# Patient Record
Sex: Male | Born: 1945
Health system: Southern US, Community
[De-identification: ages and names within clinical notes are randomized; demographics above are authoritative.]

## PROBLEM LIST (undated history)

## (undated) DIAGNOSIS — I422 Other hypertrophic cardiomyopathy: Secondary | ICD-10-CM

## (undated) DIAGNOSIS — K250 Acute gastric ulcer with hemorrhage: Secondary | ICD-10-CM

## (undated) DIAGNOSIS — I10 Essential (primary) hypertension: Secondary | ICD-10-CM

## (undated) DIAGNOSIS — I2510 Atherosclerotic heart disease of native coronary artery without angina pectoris: Secondary | ICD-10-CM

## (undated) DIAGNOSIS — E785 Hyperlipidemia, unspecified: Secondary | ICD-10-CM

## (undated) HISTORY — DX: Other hypertrophic cardiomyopathy: I42.2

## (undated) HISTORY — DX: Acute gastric ulcer with hemorrhage: K25.0

## (undated) HISTORY — DX: Hyperlipidemia, unspecified: E78.5

---

## 1990-07-23 HISTORY — PX: BACK SURGERY: SHX140

## 2002-09-01 ENCOUNTER — Ambulatory Visit (HOSPITAL_BASED_OUTPATIENT_CLINIC_OR_DEPARTMENT_OTHER): Admission: RE | Admit: 2002-09-01 | Discharge: 2002-09-01 | Payer: Self-pay | Admitting: Neurology

## 2003-06-16 ENCOUNTER — Ambulatory Visit (HOSPITAL_COMMUNITY): Admission: RE | Admit: 2003-06-16 | Discharge: 2003-06-16 | Payer: Self-pay | Admitting: Gastroenterology

## 2004-05-08 ENCOUNTER — Emergency Department (HOSPITAL_COMMUNITY): Admission: EM | Admit: 2004-05-08 | Discharge: 2004-05-08 | Payer: Self-pay | Admitting: Emergency Medicine

## 2006-09-06 ENCOUNTER — Ambulatory Visit: Payer: Self-pay | Admitting: Family Medicine

## 2006-09-06 DIAGNOSIS — K5900 Constipation, unspecified: Secondary | ICD-10-CM | POA: Insufficient documentation

## 2006-09-06 DIAGNOSIS — I10 Essential (primary) hypertension: Secondary | ICD-10-CM

## 2006-09-06 DIAGNOSIS — E785 Hyperlipidemia, unspecified: Secondary | ICD-10-CM

## 2006-09-06 LAB — CONVERTED CEMR LAB: HDL goal, serum: 40 mg/dL

## 2006-09-09 ENCOUNTER — Encounter (INDEPENDENT_AMBULATORY_CARE_PROVIDER_SITE_OTHER): Payer: Self-pay | Admitting: Family Medicine

## 2006-09-11 ENCOUNTER — Ambulatory Visit: Payer: Self-pay | Admitting: Family Medicine

## 2006-09-12 LAB — CONVERTED CEMR LAB
Albumin: 4.5 g/dL (ref 3.5–5.2)
CO2: 28 meq/L (ref 19–32)
Chloride: 103 meq/L (ref 96–112)
Cholesterol: 222 mg/dL — ABNORMAL HIGH (ref 0–200)
Glucose, Bld: 155 mg/dL — ABNORMAL HIGH (ref 70–99)
HCT: 42.5 % (ref 39.0–52.0)
Lymphocytes Relative: 36 % (ref 12–46)
Lymphs Abs: 1.6 10*3/uL (ref 0.7–3.3)
Microalb Creat Ratio: 3 mg/g (ref 0.0–30.0)
Microalb, Ur: 0.48 mg/dL (ref 0.00–1.89)
Neutrophils Relative %: 52 % (ref 43–77)
Platelets: 254 10*3/uL (ref 150–400)
Potassium: 4.9 meq/L (ref 3.5–5.3)
Sodium: 139 meq/L (ref 135–145)
Total Protein: 6.7 g/dL (ref 6.0–8.3)
Triglycerides: 70 mg/dL (ref <150)
WBC: 4.5 10*3/uL (ref 4.0–10.5)

## 2006-10-04 ENCOUNTER — Ambulatory Visit: Payer: Self-pay | Admitting: Family Medicine

## 2006-10-04 LAB — CONVERTED CEMR LAB
Hgb A1c MFr Bld: 6.3 %
LDL Goal: 70 mg/dL

## 2006-11-15 ENCOUNTER — Ambulatory Visit: Payer: Self-pay | Admitting: Family Medicine

## 2006-11-18 ENCOUNTER — Ambulatory Visit: Payer: Self-pay | Admitting: Family Medicine

## 2006-11-18 LAB — CONVERTED CEMR LAB
ALT: 17 units/L (ref 0–53)
AST: 22 units/L (ref 0–37)
Alkaline Phosphatase: 59 units/L (ref 39–117)
Calcium: 9.7 mg/dL (ref 8.4–10.5)
Chloride: 105 meq/L (ref 96–112)
Creatinine, Ser: 0.87 mg/dL (ref 0.40–1.50)
Potassium: 4.7 meq/L (ref 3.5–5.3)

## 2006-11-29 ENCOUNTER — Telehealth (INDEPENDENT_AMBULATORY_CARE_PROVIDER_SITE_OTHER): Payer: Self-pay | Admitting: Family Medicine

## 2006-12-27 ENCOUNTER — Ambulatory Visit: Payer: Self-pay | Admitting: Family Medicine

## 2006-12-27 DIAGNOSIS — F528 Other sexual dysfunction not due to a substance or known physiological condition: Secondary | ICD-10-CM | POA: Insufficient documentation

## 2006-12-30 ENCOUNTER — Encounter (INDEPENDENT_AMBULATORY_CARE_PROVIDER_SITE_OTHER): Payer: Self-pay | Admitting: Family Medicine

## 2006-12-31 ENCOUNTER — Ambulatory Visit (HOSPITAL_COMMUNITY): Admission: RE | Admit: 2006-12-31 | Discharge: 2006-12-31 | Payer: Self-pay | Admitting: Family Medicine

## 2006-12-31 LAB — CONVERTED CEMR LAB
Albumin: 4.3 g/dL (ref 3.5–5.2)
Alkaline Phosphatase: 58 units/L (ref 39–117)
BUN: 13 mg/dL (ref 6–23)
CO2: 26 meq/L (ref 19–32)
Calcium: 9.1 mg/dL (ref 8.4–10.5)
Cholesterol: 197 mg/dL (ref 0–200)
Glucose, Bld: 176 mg/dL — ABNORMAL HIGH (ref 70–99)
HDL: 60 mg/dL (ref >39)
LDL Cholesterol: 126 mg/dL — ABNORMAL HIGH (ref 0–99)
Potassium: 4.3 meq/L (ref 3.5–5.3)
Triglycerides: 53 mg/dL (ref <150)

## 2007-01-01 ENCOUNTER — Telehealth (INDEPENDENT_AMBULATORY_CARE_PROVIDER_SITE_OTHER): Payer: Self-pay | Admitting: *Deleted

## 2007-01-01 ENCOUNTER — Encounter (INDEPENDENT_AMBULATORY_CARE_PROVIDER_SITE_OTHER): Payer: Self-pay | Admitting: Family Medicine

## 2007-01-01 DIAGNOSIS — IMO0002 Reserved for concepts with insufficient information to code with codable children: Secondary | ICD-10-CM | POA: Insufficient documentation

## 2007-01-08 ENCOUNTER — Telehealth (INDEPENDENT_AMBULATORY_CARE_PROVIDER_SITE_OTHER): Payer: Self-pay | Admitting: *Deleted

## 2007-01-14 ENCOUNTER — Encounter (INDEPENDENT_AMBULATORY_CARE_PROVIDER_SITE_OTHER): Payer: Self-pay | Admitting: Family Medicine

## 2007-01-15 ENCOUNTER — Telehealth (INDEPENDENT_AMBULATORY_CARE_PROVIDER_SITE_OTHER): Payer: Self-pay | Admitting: *Deleted

## 2007-01-16 ENCOUNTER — Telehealth (INDEPENDENT_AMBULATORY_CARE_PROVIDER_SITE_OTHER): Payer: Self-pay | Admitting: *Deleted

## 2007-01-28 ENCOUNTER — Telehealth (INDEPENDENT_AMBULATORY_CARE_PROVIDER_SITE_OTHER): Payer: Self-pay | Admitting: *Deleted

## 2007-01-29 ENCOUNTER — Telehealth (INDEPENDENT_AMBULATORY_CARE_PROVIDER_SITE_OTHER): Payer: Self-pay | Admitting: *Deleted

## 2007-02-17 ENCOUNTER — Encounter (INDEPENDENT_AMBULATORY_CARE_PROVIDER_SITE_OTHER): Payer: Self-pay | Admitting: Family Medicine

## 2007-02-26 ENCOUNTER — Telehealth (INDEPENDENT_AMBULATORY_CARE_PROVIDER_SITE_OTHER): Payer: Self-pay | Admitting: *Deleted

## 2007-03-04 ENCOUNTER — Telehealth (INDEPENDENT_AMBULATORY_CARE_PROVIDER_SITE_OTHER): Payer: Self-pay | Admitting: Family Medicine

## 2007-03-28 ENCOUNTER — Ambulatory Visit: Payer: Self-pay | Admitting: Family Medicine

## 2007-03-28 DIAGNOSIS — R9431 Abnormal electrocardiogram [ECG] [EKG]: Secondary | ICD-10-CM

## 2007-04-01 ENCOUNTER — Telehealth (INDEPENDENT_AMBULATORY_CARE_PROVIDER_SITE_OTHER): Payer: Self-pay | Admitting: *Deleted

## 2007-04-02 ENCOUNTER — Telehealth (INDEPENDENT_AMBULATORY_CARE_PROVIDER_SITE_OTHER): Payer: Self-pay | Admitting: Family Medicine

## 2007-04-08 ENCOUNTER — Encounter (INDEPENDENT_AMBULATORY_CARE_PROVIDER_SITE_OTHER): Payer: Self-pay | Admitting: Family Medicine

## 2007-04-09 ENCOUNTER — Ambulatory Visit: Payer: Self-pay | Admitting: Cardiovascular Disease

## 2007-04-17 ENCOUNTER — Ambulatory Visit: Payer: Self-pay | Admitting: Cardiovascular Disease

## 2007-04-17 ENCOUNTER — Encounter (HOSPITAL_COMMUNITY): Admission: RE | Admit: 2007-04-17 | Discharge: 2007-04-22 | Payer: Self-pay | Admitting: Cardiovascular Disease

## 2007-04-17 ENCOUNTER — Encounter (INDEPENDENT_AMBULATORY_CARE_PROVIDER_SITE_OTHER): Payer: Self-pay | Admitting: Family Medicine

## 2007-04-21 ENCOUNTER — Encounter (INDEPENDENT_AMBULATORY_CARE_PROVIDER_SITE_OTHER): Payer: Self-pay | Admitting: Family Medicine

## 2007-05-14 ENCOUNTER — Telehealth (INDEPENDENT_AMBULATORY_CARE_PROVIDER_SITE_OTHER): Payer: Self-pay | Admitting: *Deleted

## 2007-05-28 ENCOUNTER — Ambulatory Visit: Payer: Self-pay | Admitting: Family Medicine

## 2007-06-16 ENCOUNTER — Ambulatory Visit: Payer: Self-pay | Admitting: Family Medicine

## 2007-06-16 DIAGNOSIS — E119 Type 2 diabetes mellitus without complications: Secondary | ICD-10-CM

## 2007-06-16 LAB — CONVERTED CEMR LAB
Glucose, Bld: 200 mg/dL
Hgb A1c MFr Bld: 9.5 %

## 2007-06-17 ENCOUNTER — Encounter (INDEPENDENT_AMBULATORY_CARE_PROVIDER_SITE_OTHER): Payer: Self-pay | Admitting: Family Medicine

## 2007-06-17 LAB — CONVERTED CEMR LAB
ALT: 22 U/L
AST: 17 U/L
Albumin: 4.6 g/dL
Alkaline Phosphatase: 66 U/L
BUN: 13 mg/dL
Basophils Absolute: 0 K/uL
Basophils Relative: 0 %
CO2: 23 meq/L
Calcium: 9.8 mg/dL
Chloride: 101 meq/L
Cholesterol: 217 mg/dL — ABNORMAL HIGH
Creatinine, Ser: 0.84 mg/dL
Eosinophils Absolute: 0 K/uL — ABNORMAL LOW
Eosinophils Relative: 1 %
Glucose, Bld: 254 mg/dL — ABNORMAL HIGH
HCT: 41.9 %
HDL: 67 mg/dL
Hemoglobin: 14.4 g/dL
LDL Cholesterol: 137 mg/dL — ABNORMAL HIGH
Lymphocytes Relative: 30 %
Lymphs Abs: 1.7 K/uL
MCHC: 34.4 g/dL
MCV: 84.1 fL
Monocytes Absolute: 0.4 K/uL
Monocytes Relative: 7 %
Neutro Abs: 3.7 K/uL
Neutrophils Relative %: 63 %
Platelets: 236 K/uL
Potassium: 4.3 meq/L
RBC: 4.98 M/uL
RDW: 13.2 %
Sodium: 139 meq/L
TSH: 0.957 u[IU]/mL
Total Bilirubin: 0.9 mg/dL
Total CHOL/HDL Ratio: 3.2
Total Protein: 6.8 g/dL
Triglycerides: 67 mg/dL
VLDL: 13 mg/dL
WBC: 5.8 10*3/microliter

## 2007-06-18 ENCOUNTER — Telehealth (INDEPENDENT_AMBULATORY_CARE_PROVIDER_SITE_OTHER): Payer: Self-pay | Admitting: *Deleted

## 2007-07-04 ENCOUNTER — Encounter (INDEPENDENT_AMBULATORY_CARE_PROVIDER_SITE_OTHER): Payer: Self-pay | Admitting: Family Medicine

## 2007-07-10 ENCOUNTER — Telehealth (INDEPENDENT_AMBULATORY_CARE_PROVIDER_SITE_OTHER): Payer: Self-pay | Admitting: Family Medicine

## 2007-07-28 ENCOUNTER — Ambulatory Visit: Payer: Self-pay | Admitting: Family Medicine

## 2007-07-28 ENCOUNTER — Telehealth (INDEPENDENT_AMBULATORY_CARE_PROVIDER_SITE_OTHER): Payer: Self-pay | Admitting: *Deleted

## 2007-07-29 ENCOUNTER — Emergency Department (HOSPITAL_COMMUNITY): Admission: EM | Admit: 2007-07-29 | Discharge: 2007-07-30 | Payer: Self-pay | Admitting: Emergency Medicine

## 2007-07-29 ENCOUNTER — Encounter (INDEPENDENT_AMBULATORY_CARE_PROVIDER_SITE_OTHER): Payer: Self-pay | Admitting: Family Medicine

## 2007-08-01 ENCOUNTER — Ambulatory Visit: Payer: Self-pay | Admitting: Cardiology

## 2007-08-05 ENCOUNTER — Telehealth (INDEPENDENT_AMBULATORY_CARE_PROVIDER_SITE_OTHER): Payer: Self-pay | Admitting: Family Medicine

## 2007-08-07 ENCOUNTER — Telehealth (INDEPENDENT_AMBULATORY_CARE_PROVIDER_SITE_OTHER): Payer: Self-pay | Admitting: *Deleted

## 2007-08-07 ENCOUNTER — Ambulatory Visit: Payer: Self-pay | Admitting: Family Medicine

## 2007-08-07 DIAGNOSIS — R55 Syncope and collapse: Secondary | ICD-10-CM

## 2007-08-07 DIAGNOSIS — R9389 Abnormal findings on diagnostic imaging of other specified body structures: Secondary | ICD-10-CM

## 2007-08-12 ENCOUNTER — Ambulatory Visit (HOSPITAL_COMMUNITY): Admission: RE | Admit: 2007-08-12 | Discharge: 2007-08-12 | Payer: Self-pay | Admitting: Family Medicine

## 2007-08-12 ENCOUNTER — Encounter (INDEPENDENT_AMBULATORY_CARE_PROVIDER_SITE_OTHER): Payer: Self-pay | Admitting: Family Medicine

## 2007-08-14 ENCOUNTER — Encounter (INDEPENDENT_AMBULATORY_CARE_PROVIDER_SITE_OTHER): Payer: Self-pay | Admitting: Family Medicine

## 2007-08-14 ENCOUNTER — Telehealth (INDEPENDENT_AMBULATORY_CARE_PROVIDER_SITE_OTHER): Payer: Self-pay | Admitting: *Deleted

## 2007-08-19 ENCOUNTER — Encounter (INDEPENDENT_AMBULATORY_CARE_PROVIDER_SITE_OTHER): Payer: Self-pay | Admitting: Family Medicine

## 2007-08-21 ENCOUNTER — Ambulatory Visit: Payer: Self-pay | Admitting: Family Medicine

## 2007-08-25 ENCOUNTER — Encounter (INDEPENDENT_AMBULATORY_CARE_PROVIDER_SITE_OTHER): Payer: Self-pay | Admitting: Family Medicine

## 2007-08-27 ENCOUNTER — Encounter (INDEPENDENT_AMBULATORY_CARE_PROVIDER_SITE_OTHER): Payer: Self-pay | Admitting: Family Medicine

## 2007-09-01 ENCOUNTER — Ambulatory Visit (HOSPITAL_COMMUNITY): Admission: RE | Admit: 2007-09-01 | Discharge: 2007-09-01 | Payer: Self-pay | Admitting: *Deleted

## 2007-09-04 ENCOUNTER — Ambulatory Visit (HOSPITAL_COMMUNITY): Admission: RE | Admit: 2007-09-04 | Discharge: 2007-09-04 | Payer: Self-pay | Admitting: Cardiovascular Disease

## 2007-09-17 ENCOUNTER — Encounter (INDEPENDENT_AMBULATORY_CARE_PROVIDER_SITE_OTHER): Payer: Self-pay | Admitting: Family Medicine

## 2007-09-23 ENCOUNTER — Ambulatory Visit: Payer: Self-pay | Admitting: Family Medicine

## 2007-09-23 LAB — CONVERTED CEMR LAB
Glucose, Bld: 163 mg/dL
Glucose, Urine, Semiquant: NEGATIVE
Hgb A1c MFr Bld: 7.7 %
Ketones, urine, test strip: NEGATIVE
Nitrite: NEGATIVE
Specific Gravity, Urine: 1.02
pH: 6

## 2007-09-24 ENCOUNTER — Telehealth (INDEPENDENT_AMBULATORY_CARE_PROVIDER_SITE_OTHER): Payer: Self-pay | Admitting: *Deleted

## 2007-09-24 LAB — CONVERTED CEMR LAB
Creatinine, Urine: 87.3 mg/dL
Microalb, Ur: 0.52 mg/dL (ref 0.00–1.89)

## 2007-10-08 ENCOUNTER — Encounter (INDEPENDENT_AMBULATORY_CARE_PROVIDER_SITE_OTHER): Payer: Self-pay | Admitting: Family Medicine

## 2007-10-23 ENCOUNTER — Inpatient Hospital Stay (HOSPITAL_COMMUNITY): Admission: AD | Admit: 2007-10-23 | Discharge: 2007-10-24 | Payer: Self-pay | Admitting: Cardiology

## 2007-10-30 ENCOUNTER — Encounter (INDEPENDENT_AMBULATORY_CARE_PROVIDER_SITE_OTHER): Payer: Self-pay | Admitting: Family Medicine

## 2007-11-12 ENCOUNTER — Telehealth (INDEPENDENT_AMBULATORY_CARE_PROVIDER_SITE_OTHER): Payer: Self-pay | Admitting: *Deleted

## 2007-11-14 ENCOUNTER — Encounter (INDEPENDENT_AMBULATORY_CARE_PROVIDER_SITE_OTHER): Payer: Self-pay | Admitting: Family Medicine

## 2007-12-16 ENCOUNTER — Telehealth (INDEPENDENT_AMBULATORY_CARE_PROVIDER_SITE_OTHER): Payer: Self-pay | Admitting: *Deleted

## 2007-12-23 ENCOUNTER — Ambulatory Visit: Payer: Self-pay | Admitting: Family Medicine

## 2007-12-30 ENCOUNTER — Encounter (INDEPENDENT_AMBULATORY_CARE_PROVIDER_SITE_OTHER): Payer: Self-pay | Admitting: Family Medicine

## 2007-12-31 LAB — CONVERTED CEMR LAB
ALT: 17 units/L (ref 0–53)
AST: 19 units/L (ref 0–37)
Alkaline Phosphatase: 58 units/L (ref 39–117)
Basophils Absolute: 0 10*3/uL (ref 0.0–0.1)
Basophils Relative: 0 % (ref 0–1)
CO2: 26 meq/L (ref 19–32)
Cholesterol: 171 mg/dL (ref 0–200)
Creatinine, Ser: 0.85 mg/dL (ref 0.40–1.50)
Eosinophils Absolute: 0.2 10*3/uL (ref 0.0–0.7)
Eosinophils Relative: 4 % (ref 0–5)
HCT: 41.9 % (ref 39.0–52.0)
Hemoglobin: 13 g/dL (ref 13.0–17.0)
LDL Cholesterol: 100 mg/dL — ABNORMAL HIGH (ref 0–99)
MCHC: 31 g/dL (ref 30.0–36.0)
MCV: 86.9 fL (ref 78.0–100.0)
Monocytes Absolute: 0.4 10*3/uL (ref 0.1–1.0)
Platelets: 235 10*3/uL (ref 150–400)
RDW: 14.2 % (ref 11.5–15.5)
Sodium: 142 meq/L (ref 135–145)
Total Bilirubin: 0.8 mg/dL (ref 0.3–1.2)
Total CHOL/HDL Ratio: 2.9
Total Protein: 6.8 g/dL (ref 6.0–8.3)
VLDL: 11 mg/dL (ref 0–40)

## 2008-02-03 ENCOUNTER — Ambulatory Visit: Payer: Self-pay | Admitting: Family Medicine

## 2008-02-26 ENCOUNTER — Encounter (INDEPENDENT_AMBULATORY_CARE_PROVIDER_SITE_OTHER): Payer: Self-pay | Admitting: Family Medicine

## 2008-03-16 ENCOUNTER — Ambulatory Visit: Payer: Self-pay | Admitting: Family Medicine

## 2008-03-16 LAB — CONVERTED CEMR LAB: Blood Glucose, Fingerstick: 158

## 2008-03-17 ENCOUNTER — Encounter (INDEPENDENT_AMBULATORY_CARE_PROVIDER_SITE_OTHER): Payer: Self-pay | Admitting: Family Medicine

## 2008-04-07 ENCOUNTER — Encounter (INDEPENDENT_AMBULATORY_CARE_PROVIDER_SITE_OTHER): Payer: Self-pay | Admitting: Family Medicine

## 2008-04-08 ENCOUNTER — Encounter (INDEPENDENT_AMBULATORY_CARE_PROVIDER_SITE_OTHER): Payer: Self-pay | Admitting: Family Medicine

## 2008-04-19 ENCOUNTER — Telehealth (INDEPENDENT_AMBULATORY_CARE_PROVIDER_SITE_OTHER): Payer: Self-pay | Admitting: *Deleted

## 2008-04-26 ENCOUNTER — Encounter (INDEPENDENT_AMBULATORY_CARE_PROVIDER_SITE_OTHER): Payer: Self-pay | Admitting: Family Medicine

## 2008-04-26 ENCOUNTER — Encounter: Payer: Self-pay | Admitting: Family Medicine

## 2008-04-26 ENCOUNTER — Ambulatory Visit: Payer: Self-pay | Admitting: Family Medicine

## 2008-06-03 ENCOUNTER — Inpatient Hospital Stay (HOSPITAL_COMMUNITY): Admission: AD | Admit: 2008-06-03 | Discharge: 2008-06-04 | Payer: Self-pay | Admitting: Cardiology

## 2008-06-03 HISTORY — PX: CORONARY STENT PLACEMENT: SHX1402

## 2008-06-07 ENCOUNTER — Ambulatory Visit: Payer: Self-pay | Admitting: Family Medicine

## 2008-06-09 ENCOUNTER — Encounter (INDEPENDENT_AMBULATORY_CARE_PROVIDER_SITE_OTHER): Payer: Self-pay | Admitting: Family Medicine

## 2008-06-23 ENCOUNTER — Encounter (INDEPENDENT_AMBULATORY_CARE_PROVIDER_SITE_OTHER): Payer: Self-pay | Admitting: Family Medicine

## 2008-07-07 ENCOUNTER — Encounter: Admission: RE | Admit: 2008-07-07 | Discharge: 2008-07-07 | Payer: Self-pay | Admitting: Cardiology

## 2008-07-13 ENCOUNTER — Ambulatory Visit (HOSPITAL_COMMUNITY): Admission: RE | Admit: 2008-07-13 | Discharge: 2008-07-13 | Payer: Self-pay | Admitting: Cardiology

## 2008-07-23 HISTORY — PX: FEMORAL ENDARTERECTOMY: SUR606

## 2008-08-25 ENCOUNTER — Inpatient Hospital Stay (HOSPITAL_COMMUNITY): Admission: RE | Admit: 2008-08-25 | Discharge: 2008-08-26 | Payer: Self-pay | Admitting: *Deleted

## 2008-08-25 ENCOUNTER — Encounter (INDEPENDENT_AMBULATORY_CARE_PROVIDER_SITE_OTHER): Payer: Self-pay | Admitting: *Deleted

## 2008-08-25 ENCOUNTER — Ambulatory Visit: Payer: Self-pay | Admitting: *Deleted

## 2008-09-16 ENCOUNTER — Ambulatory Visit: Payer: Self-pay | Admitting: *Deleted

## 2008-09-16 ENCOUNTER — Encounter (INDEPENDENT_AMBULATORY_CARE_PROVIDER_SITE_OTHER): Payer: Self-pay | Admitting: Family Medicine

## 2008-09-24 ENCOUNTER — Ambulatory Visit: Payer: Self-pay | Admitting: Family Medicine

## 2008-09-24 LAB — CONVERTED CEMR LAB: Blood Glucose, Fasting: 197 mg/dL

## 2008-09-25 ENCOUNTER — Encounter (INDEPENDENT_AMBULATORY_CARE_PROVIDER_SITE_OTHER): Payer: Self-pay | Admitting: Family Medicine

## 2008-09-28 ENCOUNTER — Telehealth (INDEPENDENT_AMBULATORY_CARE_PROVIDER_SITE_OTHER): Payer: Self-pay | Admitting: *Deleted

## 2008-09-29 LAB — CONVERTED CEMR LAB
ALT: 11 units/L (ref 0–53)
AST: 16 units/L (ref 0–37)
Alkaline Phosphatase: 70 units/L (ref 39–117)
Basophils Absolute: 0 10*3/uL (ref 0.0–0.1)
Basophils Relative: 0 % (ref 0–1)
Creatinine, Urine: 131.6 mg/dL
Hemoglobin: 12.4 g/dL — ABNORMAL LOW (ref 13.0–17.0)
LDL Cholesterol: 99 mg/dL (ref 0–99)
Lymphocytes Relative: 31 % (ref 12–46)
Microalb Creat Ratio: 3.8 mg/g (ref 0.0–30.0)
Monocytes Relative: 9 % (ref 3–12)
Neutro Abs: 1.9 10*3/uL (ref 1.7–7.7)
Neutrophils Relative %: 57 % (ref 43–77)
Platelets: 229 10*3/uL (ref 150–400)
RDW: 13.5 % (ref 11.5–15.5)
Sodium: 140 meq/L (ref 135–145)
TSH: 1.044 microintl units/mL (ref 0.350–4.500)
Total Bilirubin: 0.6 mg/dL (ref 0.3–1.2)
Total Protein: 6.7 g/dL (ref 6.0–8.3)
VLDL: 11 mg/dL (ref 0–40)
WBC: 3.3 10*3/uL — ABNORMAL LOW (ref 4.0–10.5)

## 2008-10-22 ENCOUNTER — Encounter (INDEPENDENT_AMBULATORY_CARE_PROVIDER_SITE_OTHER): Payer: Self-pay | Admitting: Family Medicine

## 2008-10-27 ENCOUNTER — Ambulatory Visit: Payer: Self-pay | Admitting: Family Medicine

## 2008-10-27 DIAGNOSIS — D649 Anemia, unspecified: Secondary | ICD-10-CM

## 2008-11-17 ENCOUNTER — Encounter (INDEPENDENT_AMBULATORY_CARE_PROVIDER_SITE_OTHER): Payer: Self-pay | Admitting: Family Medicine

## 2008-11-23 ENCOUNTER — Ambulatory Visit: Payer: Self-pay | Admitting: Family Medicine

## 2008-11-24 LAB — CONVERTED CEMR LAB
ALT: 16 U/L
AST: 20 U/L
Albumin: 4.3 g/dL
Alkaline Phosphatase: 63 U/L
BUN: 12 mg/dL
Basophils Absolute: 0 K/uL
Basophils Relative: 0 %
CO2: 19 meq/L
Calcium: 9.3 mg/dL
Chloride: 108 meq/L
Creatinine, Ser: 0.86 mg/dL
Eosinophils Absolute: 0.1 K/uL
Eosinophils Relative: 1 %
Glucose, Bld: 148 mg/dL — ABNORMAL HIGH
HCT: 38.9 % — ABNORMAL LOW
Hemoglobin: 12.5 g/dL — ABNORMAL LOW
Lymphocytes Relative: 32 %
Lymphs Abs: 1.4 K/uL
MCHC: 32.1 g/dL
MCV: 80.4 fL
Monocytes Absolute: 0.4 K/uL
Monocytes Relative: 9 %
Neutro Abs: 2.5 K/uL
Neutrophils Relative %: 57 %
Platelets: 222 K/uL
Potassium: 4.4 meq/L
RBC: 4.84 M/uL
RDW: 14.3 %
Sodium: 140 meq/L
Total Bilirubin: 0.6 mg/dL
Total Protein: 6.5 g/dL
WBC: 4.4 10*3/microliter

## 2008-11-25 ENCOUNTER — Encounter (INDEPENDENT_AMBULATORY_CARE_PROVIDER_SITE_OTHER): Payer: Self-pay | Admitting: Family Medicine

## 2008-12-16 ENCOUNTER — Telehealth (INDEPENDENT_AMBULATORY_CARE_PROVIDER_SITE_OTHER): Payer: Self-pay | Admitting: *Deleted

## 2009-01-28 ENCOUNTER — Telehealth (INDEPENDENT_AMBULATORY_CARE_PROVIDER_SITE_OTHER): Payer: Self-pay | Admitting: *Deleted

## 2009-01-31 ENCOUNTER — Ambulatory Visit: Payer: Self-pay | Admitting: Family Medicine

## 2009-01-31 LAB — CONVERTED CEMR LAB: Glucose, Bld: 152 mg/dL

## 2009-03-01 ENCOUNTER — Telehealth (INDEPENDENT_AMBULATORY_CARE_PROVIDER_SITE_OTHER): Payer: Self-pay | Admitting: *Deleted

## 2009-03-07 ENCOUNTER — Ambulatory Visit: Payer: Self-pay | Admitting: Family Medicine

## 2009-03-07 DIAGNOSIS — T148 Other injury of unspecified body region: Secondary | ICD-10-CM

## 2009-03-07 DIAGNOSIS — W57XXXA Bitten or stung by nonvenomous insect and other nonvenomous arthropods, initial encounter: Secondary | ICD-10-CM

## 2009-03-29 ENCOUNTER — Ambulatory Visit: Payer: Self-pay | Admitting: Family Medicine

## 2009-03-30 ENCOUNTER — Encounter (INDEPENDENT_AMBULATORY_CARE_PROVIDER_SITE_OTHER): Payer: Self-pay | Admitting: Family Medicine

## 2009-03-31 ENCOUNTER — Encounter (INDEPENDENT_AMBULATORY_CARE_PROVIDER_SITE_OTHER): Payer: Self-pay | Admitting: Family Medicine

## 2009-03-31 LAB — CONVERTED CEMR LAB
Alkaline Phosphatase: 56 units/L (ref 39–117)
BUN: 10 mg/dL (ref 6–23)
Basophils Absolute: 0 10*3/uL (ref 0.0–0.1)
Basophils Relative: 0 % (ref 0–1)
Cholesterol: 135 mg/dL (ref 0–200)
Creatinine, Ser: 0.78 mg/dL (ref 0.40–1.50)
Eosinophils Absolute: 0.1 10*3/uL (ref 0.0–0.7)
Eosinophils Relative: 3 % (ref 0–5)
Glucose, Bld: 174 mg/dL — ABNORMAL HIGH (ref 70–99)
HCT: 35.6 % — ABNORMAL LOW (ref 39.0–52.0)
HDL: 54 mg/dL (ref >39)
Hemoglobin: 11.7 g/dL — ABNORMAL LOW (ref 13.0–17.0)
LDL Cholesterol: 73 mg/dL (ref 0–99)
MCHC: 32.9 g/dL (ref 30.0–36.0)
MCV: 81.8 fL (ref 78.0–100.0)
Monocytes Absolute: 0.3 10*3/uL (ref 0.1–1.0)
Neutro Abs: 1.9 10*3/uL (ref 1.7–7.7)
RDW: 13.7 % (ref 11.5–15.5)
Total Bilirubin: 0.4 mg/dL (ref 0.3–1.2)
Triglycerides: 38 mg/dL (ref <150)
VLDL: 8 mg/dL (ref 0–40)

## 2010-01-12 HISTORY — PX: TRANSTHORACIC ECHOCARDIOGRAM: SHX275

## 2010-11-07 LAB — COMPREHENSIVE METABOLIC PANEL
ALT: 21 U/L (ref 0–53)
AST: 29 U/L (ref 0–37)
Albumin: 3.6 g/dL (ref 3.5–5.2)
Alkaline Phosphatase: 53 U/L (ref 39–117)
CO2: 29 mEq/L (ref 19–32)
Chloride: 104 mEq/L (ref 96–112)
GFR calc Af Amer: >60 mL/min (ref >60)
GFR calc non Af Amer: >60 mL/min (ref >60)
Potassium: 3.7 mEq/L (ref 3.5–5.1)
Total Bilirubin: 1.1 mg/dL (ref 0.3–1.2)

## 2010-11-07 LAB — URINALYSIS, ROUTINE W REFLEX MICROSCOPIC
Ketones, ur: 15 mg/dL — AB
Leukocytes, UA: NEGATIVE
Nitrite: NEGATIVE
Specific Gravity, Urine: 1.025 (ref 1.005–1.030)
pH: 5.5 (ref 5.0–8.0)

## 2010-11-07 LAB — URINE MICROSCOPIC-ADD ON

## 2010-11-07 LAB — GLUCOSE, CAPILLARY
Glucose-Capillary: 175 mg/dL — ABNORMAL HIGH (ref 70–99)
Glucose-Capillary: 196 mg/dL — ABNORMAL HIGH (ref 70–99)
Glucose-Capillary: 219 mg/dL — ABNORMAL HIGH (ref 70–99)
Glucose-Capillary: 229 mg/dL — ABNORMAL HIGH (ref 70–99)

## 2010-11-07 LAB — CBC
Hemoglobin: 10.9 g/dL — ABNORMAL LOW (ref 13.0–17.0)
MCHC: 34.1 g/dL (ref 30.0–36.0)
MCV: 81.5 fL (ref 78.0–100.0)
MCV: 82.8 fL (ref 78.0–100.0)
Platelets: 224 10*3/uL (ref 150–400)
RBC: 3.92 MIL/uL — ABNORMAL LOW (ref 4.22–5.81)
RBC: 4.68 MIL/uL (ref 4.22–5.81)
WBC: 4.7 10*3/uL (ref 4.0–10.5)

## 2010-11-07 LAB — BASIC METABOLIC PANEL
CO2: 28 mEq/L (ref 19–32)
Chloride: 105 mEq/L (ref 96–112)
Creatinine, Ser: 0.81 mg/dL (ref 0.4–1.5)
GFR calc Af Amer: >60 mL/min (ref >60)
Glucose, Bld: 230 mg/dL — ABNORMAL HIGH (ref 70–99)

## 2010-11-07 LAB — TYPE AND SCREEN: Antibody Screen: NEGATIVE

## 2010-12-05 NOTE — Cardiovascular Report (Signed)
NAMEJOHNPATRICK, JENNY               ACCOUNT NO.:  [id number]   MEDICAL RECORD NO.:  [id number]          PATIENT TYPE:  OIB   LOCATION:  2857                         FACILITY:  [facility]   PHYSICIAN:  Richard A. Alanda Amass, M.D.DATE OF BIRTH:  22-Aug-1945   DATE OF PROCEDURE:  09/04/2007  DATE OF DISCHARGE:                            CARDIAC CATHETERIZATION   INDICATIONS:  Mr. Pavel is 65 year old, African American, married,  retired gentleman from [employer].  He has a history of  AODM, systemic hypertension, hyperlipidemia, family history of coronary  disease.  He is being evaluated with upright tilt table testing because  of a history of remote syncope.  Recently in January 2008, he had an  episode of presyncope feeling warm all over and diaphoresis.  He went to  [facility] and glucose was elevated to 250, but there were no  other abnormalities.  He was seen by Dr. Domingo Sep in followup.   He is on Flomax 0.4, metformin 1 g b.i.d., lisinopril 20, glipizide 10,  1 baby aspirin and Lipitor 40, which he tolerates well.  He has not been  on negative chronotropic agents and does have a history of sinus  bradycardia.  EKG shows incomplete right bundle branch block with  borderline left axis deviation.  QRS duration 0.888.   Informed consent was obtained to proceed with upright tilt table  testing.   After a period of supine observation with heart rate in 53-55 range,  blood pressure 138-147/70, the patient was raised to a 70-degree upright  tilt.  He was tilted x45 minutes and tolerated this well.  He had no  significant bradycardia or change in heart rate and no significant  change in blood pressure.  Blood pressures ranged 138-145/70-88 and  heart rate remained in the 55-60 range, but at the end of the test, did  elevate to 74-75.  He remained in sinus rhythm.   The patient was lowered down to 0 degrees and remained stable.   This is a negative tilt table test for  any significant bradycardia or  hypotension.  The patient may need to be considered for event recorder  and/or loop recorder depending on his clinical status and he will be  sent back to Dr. Domingo Sep for further followup.   It should be noted that he has had a prior history of mild, small vessel  disease on MRI of the brain and MR angiography showed no significant  carotid disease, but some suggestion of mild irregularities and possible  FMP.  Carotid Dopplers may be indicated at followup.  He also had a negative  Myoview for ischemia in September 2008, and an echocardiogram in  September 2008, showed moderate basal septal hypertrophy with aortic  valve sclerosis.  Followup with Dr. Domingo Sep and Dr. Erby Pian.      Richard A. Alanda Amass, M.D.  Electronically Signed     RAW/MEDQ  D:  09/04/2007  T:  09/05/2007  Job:  [id number]   cc:   Franchot Heidelberg, M.D.  Dani Gobble, MD

## 2010-12-05 NOTE — Cardiovascular Report (Signed)
[name] [name], [name]               ACCOUNT NO.:  [id number]   MEDICAL RECORD NO.:  [id number]          PATIENT TYPE:  AMB   LOCATION:  SDS                          FACILITY:  [facility]   PHYSICIAN:  Vonna Kotyk R. Jacinto Halim, MD       DATE OF BIRTH:  15-Feb-1946   DATE OF PROCEDURE:  07/13/2008  DATE OF DISCHARGE:                            CARDIAC CATHETERIZATION   PROCEDURES PERFORMED:  1. Abdominal aortogram.  2. Right femoral arteriogram.  3. PTA and balloon angioplasty of the dissection flap in the right      common femoral artery.  4. Left heart catheterization including,  5. Left ventriculography.  6. Selective right and left coronary arteriography.   INDICATIONS:  [name] [name] is a 65 year old hypertensive, diabetic,  hyperlipidemic gentleman with known coronary artery disease and had  undergone PTCA and stenting of a mid LAD in-stent restenosis of a non-  drug eluting stent placed on October 23, 2007, which was a 3.5 x 23-mm  Vision stent.  Because of in-stent restenosis, he had gone sandwich  stenting with a 3.5 x 28-mm Promus stent and had been doing well until  he has noticed gained minimal shortness of breath and lack of surgery,  which are his symptoms, anginal-equivalent symptoms.  Since his PTCA and  stenting to his mid-LAD, he has also developed claudication of his right  leg.  Ultrasound of the lower extremities for an abnormal physical exam  with prominent bruit had revealed presence of what appeared like an  dissection flap to the right external iliac artery.  Given this, he is  now brought back to the cardiac catheterization for a re-look at his  coronary anatomy and abdominal aortogram and possible angioplasty of his  right external iliac artery.   HEMODYNAMIC DATA:  The left ventricular pressure was 143/-1 with end  diastolic pressure of 80 mmHg.  The aortic pressure was 120/80 mmHg.  There was no significant pressure gradient across the aortic valve.   ANGIOGRAPHIC DATA:   Left ventricle:  Left ventricular systolic function  was normal with ejection fraction of 60%.  There was no regional wall  motion abnormality.   Right coronary artery:  Right coronary artery is a dominant vessel,  which has got mild luminal irregularity.  Otherwise, there is no  significant luminal obstruction.   Left main coronary artery:  Left main coronary artery is a large vessel  that is smooth and normal.   Left anterior descending artery.  The left anterior descending artery is  a large vessel.  There is significant spasm noted throughout the LAD  especially distal to the stent.  This spasm was relieved with  intracoronary 200 mcg of nitroglycerin administration.  The stent itself  was widely patent without any haziness or distal dissection.  The large  diagonal that is present proximal to the stent is also widely patent.   Ramus intermediate:  Ramus intermedius is a moderate-caliber vessel  smooth and normal.   Circumflex:  Circumflex is a moderate-to-large caliber vessel.  It had  minimal luminal irregularity, but is smooth and normal.  Pelvic aortogram:  Pelvic aortogram revealed the common and external  iliac artery to be widely patent and normal.   Right femoral arteriogram revealed a dissection flap noted in the common  femoral artery right at the neck of femur.   INTERVENTION DATA:  Unsuccessful attempt at the PTA and balloon  angioplasty of the right common femoral artery dissection flap with use  of a 7.0 x 15 mm Power balloon performed even at higher atmosphere  pressure of 11 for about 4 minutes.  Two inflations, one at 5 minutes  and another one at 4 minutes was performed.  In spite of this, there was  no change in the anatomy.   RECOMMENDATIONS:  Given the fact that there is no significant change in  the anatomy of right common femoral artery and as it is flow limiting, I  would recommend that he either undergo atherectomy using SilverHawk  catheter  versus endarterectomy and patch angioplasty surgically.  I will  discuss these options with the patient as the patient is only minimally  symptomatic, I would recommend that we could certainly wait on doing  this procedure in an elective fashion after complete discussion with the  patient.   As far his coronary artery disease is concerned, I suspect his shortness  of breath is probably related to spasm in the coronary arteries and he  will benefit from addition of nitrates or increasing the calcium channel  blocker.  Ranexa could also be considered.   A total of 116 mL of contrast was utilized for diagnostic and  interventional procedure.   TECHNIQUE OF PROCEDURE:  Under usual sterile precautions using a 6-  French left femoral arterial access, using a 5-French Omniflex catheter  or a Versicor catheter, pelvic aortogram was performed.  Bilateral  common femoral artery and iliac arteries were visualized.  Then I was  able to crossover into the right common iliac artery and right femoral  arteriogram was performed.   The catheter was then pulled back into the abdominal aorta where a  Versacore wire and pulled out the body and a 6-French multipurpose B2  catheter was advanced into the ascending aorta and then into the left  ventricle.  Left ventriculography was performed in the RAO projection.  Catheter pulled in the ascending aorta.  Right coronary artery  selectively engaged.  Angiography was performed then the left main  coronary artery was selectively engaged.  Angiography was performed.  Intracoronary nitroglycerin was also administered and angiography was  repeated.  Then catheter pulled out of the body over the Versacore wire.   TECHNIQUE OF INTERVENTION:  I was able to again re-cross from the left  into the right common femoral artery with a Versacore wire and Omniflex  catheter.  Then, a 6-French Terumo crossover sheath was utilized to  cross into the right common femoral  artery and multiple balloon  inflations after 3000 units of heparin was performed as dictated above  with the power balloon.  Having performed this, angiography repeated and  then the wire was withdrawn, angiography repeated.  The sheath was  pulled back into the left common femoral artery and the patient was  transferred to the holding area in a stable condition.  The patient  tolerated the procedure well.  No immediate complications were noted.      Cristy Hilts. Jacinto Halim, MD  Electronically Signed     JRG/MEDQ  D:  07/13/2008  T:  07/13/2008  Job:  [id number]

## 2010-12-05 NOTE — Cardiovascular Report (Signed)
NAMEJAYLAND, NULL               ACCOUNT NO.:  [id number]   MEDICAL RECORD NO.:  [id number]          PATIENT TYPE:  INP   LOCATION:  6527                         FACILITY:  [facility]   PHYSICIAN:  Cristy Hilts. Jacinto Halim, MD       DATE OF BIRTH:  [birth year]   DATE OF PROCEDURE:  10/23/2007  DATE OF DISCHARGE:                            CARDIAC CATHETERIZATION   ATTENDING CARDIOLOGIST:  Dr. Kem Boroughs.   PROCEDURE PERFORMED:  Intravascular ultrasound-guided percutaneous  transluminal coronary angioplasty and stenting of the mid-left anterior  descending artery.   INDICATIONS:  Mr. Aalijah Lanphere is a 65 year old gentleman with a  history of diabetes, hypertension, hyperlipidemia and erectile  dysfunction.  He has been complaining of exertional chest discomfort.  He had undergone a stress test which was negative for myocardial  ischemia.  However, because of persistent chest discomfort and multiple  cardiac risk factors he had undergone diagnostic cardiac catheterization  on September 26, 2007, at [facility] and he was found to have a  60% to 70% stenosis in the mid-LAD.  Was recommended medical therapy.  Because of continued chest discomfort, we decided that the physiologic  and/or anatomic significance of this stenosis needed to be determined  more accurately, hence he was brought to the cardiac catheterization lab  with a plan of either pressure wire or intravascular ultrasound-directed  cardiac catheterization and possible intervention.   Briefly, please see the dictation from [facility] on September 26, 2007, for a complete anatomy description.  Briefly:   Left main:  Left main is a large-caliber vessel that is smooth and  normal.   Circumflex coronary artery:  The circumflex is a large-caliber vessel.  It gives origin to a small AV groove branch.  It is smooth and normal.   Ramus intermediate:  Ramus intermediate it a large-caliber vessel.  It  is smooth and has mild  luminal irregularity.   LAD:  LAD is a large-caliber vessel.  In the mid segment of the LAD has  a lumpy-bumpy tandem 60% stenosis.  The distal stenosis within these  tandem stenoses appeared to be significant at 70-80%.   INTRAVASCULAR SOUND INTERROGATION OF THE LAD:  The IVUS interrogation of  the LAD reveals a high-grade mid LAD stenosis.  The entire mid and  proximal LAD had diffuse soft plaque constituting about 20-30% stenosis.  However, in the mid segment of the LAD there is a high-grade 80%  stenosis.  The lumen area measured 2.65 sq. mm at the tightest stenotic  segment.   INTERVENTION DATA:  Successful PTCA and direct stenting of the mid-LAD  with implantation of a 3.5 x 23-mm Vision stent, which was postdilated  with a 3.75 x 20-mm Quantum at 16 atmospheres of pressure.  The stenosis  was reduced from 80% to 0%.   Post-stent implantation IVUS interrogation of the LAD revealed excellent  result with excellent wall position and transition.  The tightest  stenotic segment, which previously measured 2.65 sq. mm area, now or  measured to 7.83 sq. mm.   There was proximal step-up noted.  However, there was no evidence of any  dissection or any dye staining.   Given the fact that he has diffuse plaque burden throughout, we felt  that the results were excellent and adequate.   RECOMMENDATIONS:  The patient will be continued on aggressive risk  modification.  He has been on pravastatin and Lipitor in the past.  However, we could also consider changing him over to Crestor were given  recent data about plaque regression; however, I will leave this to Dr.  Kem Boroughs for further management and evaluation.   A total of 95 mL of contrast was utilized for interventional procedure.   TECHNIQUE OF THE PROCEDURE:  Under the usual sterile precautions using a  7-French right femoral arterial access, a 7-French JL-4 guide was  utilized to engage the left main coronary artery.  Using  heparin for  anticoagulation maintaining ACT greater than 250, an ATW guidewire was  advanced into the LAD and a Galaxy IVUS catheter was advanced to the LAD  and careful IVUS interrogation was performed.  Then I decided to stent  this directly with a 3.5 x 23-mm Vision stent, which was deployed at 6  atmospheres pressure and postdilated with a 3.75 x 20-mm Quantum  Maverick balloon, again at 16 atmospheres pressure throughout the entire  stent segment.  Post-procedure intracoronary nitroglycerin was  administered and angiography was repeated.  Excellent results were  noted.  These results were confirmed by repeat IVUS.  Then the guidewire  was withdrawn and angiography repeated, guide catheter disengaged and  pulled out of the body.   Right femoral arteriography was performed through the arterial access  sheath and the access closed with a Star Close with excellent  hemostasis.  The patient tolerated the procedure well.  No immediate  complications occurred.      Cristy Hilts. Jacinto Halim, MD  Electronically Signed     JRG/MEDQ  D:  10/23/2007  T:  10/24/2007  Job:  [id number]   cc:   Dani Gobble, MD  Franchot Heidelberg, M.D.

## 2010-12-05 NOTE — Discharge Summary (Signed)
NAMEHILLARD, [name] [name]               ACCOUNT NO.:  [id number]   MEDICAL RECORD NO.:  [id number]          PATIENT TYPE:  INP   LOCATION:  2002                         FACILITY:  [facility]   PHYSICIAN:  Balinda Quails, M.D.    DATE OF BIRTH:  12-Jul-1946   DATE OF ADMISSION:  08/25/2008  DATE OF DISCHARGE:  08/26/2008                               DISCHARGE SUMMARY   FINAL DISCHARGE DIAGNOSES:  1. Peripheral vascular disease resulting in right lower extremity      claudication.  2. Dyslipidemia.  3. Diabetes.  4. High blood pressure.   PROCEDURE PERFORMED:  Right femoral endarterectomy with bovine patch  angioplasty by Dr. Madilyn Fireman on August 25, 2008.   COMPLICATIONS:  None.   CONDITION AT DISCHARGE:  Stable, improving.   DISCHARGE MEDICATIONS:  He was instructed to resume all previous  medications consisting of:  1. Saw palmetto 160 mg p.o. daily.  2. Glipizide 10 mg p.o. b.i.d.  3. Isosorbide 50 mg p.o. daily.  4. Plavix 75 mg p.o. daily.  5. Aspirin 81 mg p.o. q.a.m.  6. Metformin 1000 mg p.o. b.i.d.  7. Coreg 3.125 mg p.o. b.i.d.  8. Fosinopril 20 mg p.o. daily.  9. Crestor 20 mg one-half tablet q.p.m.  10.He is given a prescription for Percocet 5/325 one p.o. q.4 h.      p.r.n. pain, total #20 were given.   DISPOSITION:  He is being discharged home in stable condition with his  wounds healing well.  He was given careful instructions regarding the  care of his wounds, activity level, and diet.  He was given a return  appointment with Dr. Madilyn Fireman in 2 weeks for followup and ABIs.  The office  will arrange a visit.   BRIEF IDENTIFYING STATEMENT:  For complete details, please refer the  typed history and physical.  Briefly, this is a very pleasant 65-year-  old gentleman with peripheral vascular disease, diabetes, dyslipidemia,  was referred to Dr. Madilyn Fireman for right lower extremity claudication.  He  was found to have dissection of his right femoral artery.  Dr. Madilyn Fireman  recommended  right femoral endarterectomy.  He was informed of the risks  and benefits of the procedure, and after careful consideration elected  to proceed with surgery.   HOSPITAL COURSE:  Preoperative workup was completed as an outpatient.  He was brought in through Same-Day Surgery and underwent the  aforementioned right carotid endarterectomy.  For complete details,  please refer the typed operative report.  The procedure was without  complications.  He was returned to the postanesthesia care unit,  extubated.  Following stabilization, he was transferred to a bed on the  surgical convalescent floor.  The following day, his wound was healing  without signs of infection.  His right lower extremity was warm and well  perfused.  He was desirous of discharge and was subsequently discharged  home in stable condition.      Wilmon Arms, PA      P. Liliane Bade, M.D.  Electronically Signed    KEL/MEDQ  D:  08/26/2008  T:  08/26/2008  Job:  [id number]

## 2010-12-05 NOTE — Op Note (Signed)
[name], [name]               ACCOUNT NO.:  [id number]   MEDICAL RECORD NO.:  [id number]          PATIENT TYPE:  INP   LOCATION:  2002                         FACILITY:  [facility]   PHYSICIAN:  Balinda Quails, M.D.    DATE OF BIRTH:  07-03-1946   DATE OF PROCEDURE:  08/25/2008  DATE OF DISCHARGE:                               OPERATIVE REPORT   SURGEON:  Balinda Quails, MD   ASSISTANT:  RNFA.   ANESTHETIC:  General endotracheal.   ANESTHESIOLOGIST:  Sheldon Silvan, MD   PREOPERATIVE DIAGNOSES:  1. Right lower extremity claudication.  2. Right femoral artery dissection.   POSTOPERATIVE DIAGNOSES:  1. Right lower extremity claudication.  2. Right femoral artery dissection.   PROCEDURE:  Right femoral endarterectomy with bovine patch angioplasty.   CLINICAL NOTE:  [name] [name] is a 65 year old male with history of  coronary artery disease.  Recently underwent left heart catheterization  via the right common femoral artery.  He developed a dissection flap in  his right common femoral artery associated with claudication in his  right leg.  Attempt at balloon angioplasty to repair the flap was  unsuccessful.  Therefore, brought to the operating room at this time for  repair of the right common femoral artery for relief of claudication  symptoms.   OPERATIVE PROCEDURE:  The patient was brought to the operating room in  stable condition.  Placed under general endotracheal anesthesia.  In the  supine position, the right leg was prepped and draped in sterile  fashion.   A longitudinal skin incision was made through the right groin.  Dissection was carried through subcutaneous tissue using electrocautery.  Deep dissection was carried down to expose the inguinal ligament.  The  right common femoral artery was encircled proximally with vessel loop.  Distal dissection was carried along the common femoral artery.  Two  areas of recent catheterization were identified with closure  devices  present.  The origin of superficial femoral and profunda femoris artery  were each encircled with vessel loops.   The patient administered 5000 units of heparin intravenously.  Adequate  circulation time permitted.  The femoral vessels were controlled with  clamps.  A longitudinal arteriotomy was made in the right common femoral  artery.  The area of dissection flap was easily identified.  The  dissection flap was resected.  A patch angioplasty was then carried out  with a bovine patch using running 6-0 Prolene suture.  At completion of  the patch angioplasty, all vessels well flushed.  Clamps were removed.  Excellent flow was present in the right leg.   Adequate hemostasis was obtained.  The patient was administered 50 mg of  protamine intravenously.   Subcutaneous tissue was closed with running 2-0 Vicryl suture in 2  layers.  Skin was closed with 4-0 Monocryl and Dermabond.  The patient  tolerated the procedure well.  No apparent complications.  Transferred  to recovery room in stable condition.      Balinda Quails, M.D.  Electronically Signed     PGH/MEDQ  D:  08/25/2008  T:  08/25/2008  Job:  [id number]   cc:   Cristy Hilts. Jacinto Halim, MD

## 2010-12-05 NOTE — Discharge Summary (Signed)
[name], [name]               ACCOUNT NO.:  [id number]   MEDICAL RECORD NO.:  [id number]          PATIENT TYPE:  INP   LOCATION:  6527                         FACILITY:  [facility]   PHYSICIAN:  Dani Gobble, MD       DATE OF BIRTH:  01-26-1946   DATE OF ADMISSION:  10/23/2007  DATE OF DISCHARGE:  10/24/2007                               DISCHARGE SUMMARY   DISCHARGE DIAGNOSES:  1. Coronary artery disease, status post elective Multi-Link Vision      stenting to the left anterior descending this admission.  2. Presyncope and syncope.  3. Benign prostatic hypertrophy.  4. Non-insulin dependent diabetes.  5. Treated hypertension.  6. Dyslipidemia.   HOSPITAL COURSE:  [name] [name] is a 65 year old male followed by Dr.  Franchot Heidelberg and seen by Dr. Domingo Sep, October 08, 2007.  The  patient had a syncopal spell as an outpatient.  Echocardiogram showed  basal septal hypertrophy with an EF of 55%.  Tilt table test was  negative.  Diagnostic catheterization was done as an outpatient which  showed a 60% LAD.  The patient did have some chest pain.  He underwent  elective catheterization with plans for IVUS.  This was done on October 23, 2007.  The patient had a Multi-Link stent placed.  Dr. Jacinto Halim felt there  was diffuse soft plaque and the aggressive medical therapy was  recommended.  We feel he can be discharged on October 24, 2007.   LABORATORY DATA:  Labs at discharge, white count 4.2, hemoglobin 11.3,  hematocrit 33.2, and platelets 213.  Sodium 139, potassium 4.2, BUN 9,  and creatinine 0.8.  Troponin is negative.  EKG shows sinus rhythm  without acute changes.   DISCHARGE MEDICATIONS:  1. Flomax 0.4 mg a day.  2. Metformin 1 g twice a day.  3. We will start Monday isosorbide 10 mg a day.  4. Glipizide 5 mg a day.  5. Aspirin 81 mg a day.  6. Lipitor 40 mg a day.  7. Januvia 100 mg a day.  8. Gabapentin 300 mg a day.  9. Plavix 75 mg a day.  10.Nitroglycerin sublingual p.r.n.   DISPOSITION:  The patient is discharged in stable condition and will  follow up with Dr. Domingo Sep in a couple weeks as an outpatient.      Abelino Derrick, P.A.    ______________________________  Dani Gobble, MD    LKK/MEDQ  D:  10/24/2007  T:  10/25/2007  Job:  [id number]

## 2010-12-05 NOTE — Cardiovascular Report (Signed)
[name] [name]               ACCOUNT NO.:  [id number]   MEDICAL RECORD NO.:  [id number]          PATIENT TYPE:  INP   LOCATION:  6529                         FACILITY:  [facility]   PHYSICIAN:  Cristy Hilts. Jacinto Halim, MD       DATE OF BIRTH:  07-Jan-1946   DATE OF PROCEDURE:  06/03/2008  DATE OF DISCHARGE:                            CARDIAC CATHETERIZATION   PROCEDURE PERFORMED:  PTCA and stenting of the mid LAD.   INDICATIONS:  [name] [name] is a 65 year old gentleman with known  coronary artery disease.  He had undergone PTCA and stenting to his mid  LAD on October 23, 2007, with implantation of a 3.5 x 23-mm Vision stent.  He had been doing well until recently, has noticed exertional chest pain  with burning sensation in his chest which is similar to his angina  previously and for this he underwent cardiac catheterization on May 25, 2008, and this revealed a diffuse long segment in-stent 80%  restenosis of the stent.  Given this, he is now brought to the  catheterization lab for elective angioplasty.   ANGIOGRAPHIC DATA:  Briefly,  Left main:  Left main is a large caliber vessel.  It appeared smooth and  normal.   Circumflex coronary artery:  Circumflex is a moderate caliber vessel  which has got minimal luminal irregularity.   Ramus intermediate:  Ramus intermediate is a moderate caliber vessel.  It is smooth and normal.   LAD:  LAD shows proximal to mid 10% stenosis, and after the origin of a  moderate-sized diagonal 1, there is a 20% stenosis.   The previously placed mid LAD stent has diffuse long segment 80%  stenosis.  The mid-to-distal segment of the LAD has a 10% stenosis.   INTERVENTIONAL DATA:  Successful PTCA and stenting of the mid LAD with  implantation of a 3.5 x 28-mm Promus stent deployed at 10 atmospheric  pressure and postdilated with a 3.25 x 20-mm Wilmar Voyager balloon at 20  atmospheric pressure with overall reduction of stenosis from 80% to 0%  with brisk TIMI 3  to TIMI-3 flow maintained at the end of the procedure.  There is no evidence of dissection or thrombus at the end of the  procedure.   RECOMMENDATIONS:  The patient will be controlled on aspirin  indefinitely.  He will need Plavix for at least a period of a year.  Continued risk modification is indicated.   TECHNIQUE OF PROCEDURE:  Under usual sterile precautions using a 7-  French right femoral arterial access, a 7-French XB 3.5 guide was  utilized to engage the left main coronary artery and using Angiomax for  anticoagulation an ATW guidewire was advanced into the LAD and the  lesion length was carefully measured both under fluoroscopic  angiographic and marker wire technique.  This lesion was predilated with  a 3.0 x 20-mm Voyager balloon at 16 atmospheric pressure x2.  Having  performed this, we stented this with a 3.5 x 28-mm Promus stent deployed  at 10 atmospheric pressure and postdilated with [place] Voyager at 20  atmospheric pressure x2  for 30 seconds and 20 seconds with keeping the  balloon within the stent struts.  Intracoronary nitroglycerin was  administered and angiography repeated.  Excellent results were noted.  Guidewire was withdrawn.  Angiography repeated.  Guide catheter  disengaged and pulled out of body over a J-wire.   Right femoral arteriography was performed through the arterial access  sheath and the access closed with StarClose with excellent hemostasis.  The patient tolerated the procedure well.      Cristy Hilts. Jacinto Halim, MD  Electronically Signed     JRG/MEDQ  D:  06/03/2008  T:  06/03/2008  Job:  [id number]

## 2010-12-05 NOTE — Assessment & Plan Note (Signed)
[facility]                       [place] CARDIOLOGY OFFICE NOTE   MATTHIEU, LOFTUS                        MRN:          [id number]  DATE:04/09/2007                            DOB:          03/22/1946    SUBJECTIVE:  [name] [name] is a 65 year old patient, referred by Dr.  Franchot Heidelberg, for an abnormal electrocardiogram and multiple  coronary risk factors.   HISTORY OF PRESENT ILLNESS:  Mr. Fichera has not had any cardiac  symptoms.  He describes himself as active.  His limitations are  primarily from a previous back problem.  He does not get exertional  chest pain, PND or orthopnea.  There is no angina.  He has not had any  significant cardiac problems.  He describes being seen by Dr. Cy Blamer  many years ago.  He also describes a probably heart catheterization that  he had done.  He said at the time there was no evidence of blockages but  he had a crooked valve.  I am not sure what Delshon meant by this.  He  has not had followup since seeing Dr. Cy Blamer.   Dr. Erby Pian felt his electrocardiogram was abnormal, and given his  diabetes, hypercholesterolemia and hypertension, he thought he should  have a further workup.   The patient has been on cholesterol and blood pressure medications for a  long time.  He seems to be well-controlled.  He has also been a fairly  longstanding non-insulin diabetic.  He says his sugars have been well-  controlled.  I do not have a recent hemoglobin A1c on him.  The patient  is compliant with his medications.  He tries to watch his diet.  Although he is retired, he is still active.  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  He  does a lot of work around cars.  Again, his only limitations seem to be  from his back problems.   I do not have an old electrocardiogram on the patient.  He does not  inform me of having a chronically-abnormal electrocardiogram.   There has  been no previous history of hypertrophic cardiomyopathy.  No history of  syncope or arrhythmia.  No evidence of previous long QT syndrome.   REVIEW OF SYSTEMS:  Otherwise is negative.   PAST MEDICAL HISTORY:  1. Remarkable for diabetes.  2. Hypertension.  3. Hypercholesterolemia.  4. Question distant history of workup for heart problems by Dr.      Cy Blamer.  5. History of lumbar back surgery by Dr. Marlowe Kays, with      recurrent problems, likely needing either recurrent surgery by Dr.      Coletta Memos for steroid injections.   SOCIAL HISTORY:  The patient is happily married.  His wife was with him.  He has three children.  He does not smoke or use alcohol.  He is retired  from [employer].   FAMILY HISTORY:  His father died at age 75, of coronary artery disease.  His mother died at age 97, of  old age.   ALLERGIES:  Denied.   CURRENT MEDICATIONS:  1. Flomax 0.4 mg daily.  2. Lisinopril 20 mg daily.  3. Glipizide 5 mg twice daily.  4. Aspirin daily.  5. Lipitor 40 mg daily.  6. Metformin 500 mg three times daily.   PHYSICAL EXAMINATION:  GENERAL:  A healthy-appearing middle-aged black  male, in no distress.  Affect is appropriate.  VITAL SIGNS:  Weight 161 pounds, blood pressure 120/72, pulse 80 and  regular, respirations 12.  He is afebrile.  HEENT:  Normal.  Carotids normal without bruits.  There is no  lymphadenopathy or thyromegaly.  No JVP elevation.  LUNGS:  Clear.  Good diaphragmatic motion.  No wheezing.  BACK:  He has a previous lumbar surgical scar.  HEART:  There is an S1 and S2 with normal PMI, normal heart sounds.  ABDOMEN:  Benign.  Bowel sounds positive.  No abdominal aortic aneurysm.  No tenderness.  No hepatosplenomegaly, no hepatojugular reflux.  No  bruits.  EXTREMITIES:  Distal pulses intact.  No edema.  PTs are +3, femorals are  +3.  NEUROLOGIC:  Nonfocal.  MUSCULOSKELETAL:  There is no muscular weakness.  SKIN:  Warm  and dry.   His electrocardiogram did show sinus rhythm with biphasic T-waves in the  anterolateral leads, read as consider anterolateral wall ischemia.   IMPRESSION/PLAN:  1. Abnormal electrocardiogram in the setting of multiple coronary risk      factors:  He clearly needs a stress Myoview, despite his back      problem.  He thinks he can walk for this.  We will arrange;      however, I suspect this is a normal variant for him and may be      related to his blood pressure issues.  2. Hypertension, currently well-controlled:  Continue ACE inhibitor      and a low-salt diet.  3. Hypercholesterolemia:  Suspect that Dr. Erby Pian is following this      closely.  Continue statin drug.  4. Diabetes:  Aim for a hemoglobin A1c in the 6 range.  Follow up with      Dr. Erby Pian.  Currently not requiring Lantus insulin.  5. Chronic back problems:  Follow up with Dr. Franky Macho.  I think it is      particularly important to make sure his heart is okay, in regards      to clearing him for any possibly repeat surgery.  6. Question history of a crooked valve:  He has no obvious murmur on      examination; however, as part of his workup I think he should have      a 2-D echocardiogram.  If he does not have coronary artery disease,      it would be important to make sure he does not have      some sort of hypertrophic cardiomyopathy or abnormal left      ventricular dysfunction that      would cause his electrocardiogram to be abnormal, so long as his      stress test and echocardiogram were normal.  I will see him back in      one year.     Noralyn Pick. Eden Emms, MD, [facility]  Electronically Signed    PCN/MedQ  DD: 04/09/2007  DT: 04/09/2007  Job #: [id number]

## 2010-12-05 NOTE — Procedures (Signed)
[name], [name]               ACCOUNT NO.:  [id number]   MEDICAL RECORD NO.:  [id number]          PATIENT TYPE:  REC   LOCATION:  RAD                           FACILITY:  [facility]   PHYSICIAN:  Peter C. Eden Emms, MD, FACCDATE OF BIRTH:  1945-11-15   DATE OF PROCEDURE:  DATE OF DISCHARGE:                                ECHOCARDIOGRAM   Echocardiogram   INDICATIONS:  Hypertension, diabetes   The left ventricular cavity size was mildly dilated.  There was moderate  basal septal hypertrophy without outflow tract obstruction.  EF was 55%.  There was moderate aortic valve sclerosis was trivial aortic stenosis.  The mean gradient was 4 mmHg.  The peak gradient of 10 mmHg, peak  velocity was only 1.6 meters per second.  There was no AI.  Aortic root  was normal.  Mitral valve was mildly thickened with mild MR.  There was  mild left atrial enlargement.  Right-sided cardiac chambers were normal.  Subcostal imaging revealed no atrial septal defect, no source of embolus  and no effusion.  M-mode measurements showed an aortic dimension of 29  mm, left atrial dimension 46 mm, septal thickness at the base 18 mm.   FINAL IMPRESSION:  1. Moderate basal septal hypertrophy.  EF 55% with mild left      ventricular cavity enlargement.  2. Moderate aortic valve sclerosis but only trivial aortic stenosis.  3. Mild left atrial enlargement.  4. Mild MR.  5. Normal right ventricle.  6. No pericardial effusion.      [name] [name]. Eden Emms, MD, [facility]  Electronically Signed     PCN/MEDQ  D:  04/17/2007  T:  04/18/2007  Job:  [id number]

## 2010-12-05 NOTE — Assessment & Plan Note (Signed)
OFFICE VISIT   [name] [name], [name] [name]  DOB:  08-07-1945                                       09/16/2008  EAVWU#:[id number]   The patient underwent right femoral endarterectomy with bovine patch  angioplasty carried out 08/25/2008.  This was carried out for a right  common femoral artery dissection with associated claudication.   He has done well since his surgery.  His right groin incision is healing  unremarkably.  He has no further claudication symptoms.   BP 150/84, pulse 67 per minute.   Lower extremities reveal ABIs of 1.2 bilaterally.   The patient is doing well following the surgery.  Discharged from  further scheduled followup at this time.   Balinda Quails, M.D.  Electronically Signed   PGH/MEDQ  D:  09/16/2008  T:  09/17/2008  Job:  [id number]   cc:   Cristy Hilts. Jacinto Halim, MD

## 2010-12-05 NOTE — Discharge Summary (Signed)
[name] [name], [name] [name]               ACCOUNT NO.:  [id number]   MEDICAL RECORD NO.:  [id number]          PATIENT TYPE:  INP   LOCATION:  6529                         FACILITY:  [facility]   PHYSICIAN:  Cristy Hilts. Jacinto Halim, MD       DATE OF BIRTH:  03/31/1946   DATE OF ADMISSION:  06/03/2008  DATE OF DISCHARGE:  06/04/2008                               DISCHARGE SUMMARY   DISCHARGE DIAGNOSES:  1. Unstable angina.  2. Abnormal outpatient Myoview with subsequent outpatient      catheterization, revealing in-stent restenosis of previously placed      left anterior descending coronary artery stent.  3. Good left ventricular function.  4. Non-insulin-dependent diabetes.  5. Treated dyslipidemia.  6. Treated hypertension.   HOSPITAL COURSE:  The patient is a pleasant 65 year old male followed by  Dr. Jacinto Halim with a history of coronary artery disease, diabetes, and  hypertension.  He had a LAD Vision stent placed in April 2009.  He  presented to the office recently for a prescription for Viagra.  Dr.  Domingo Sep had seen him in the office and set him up for a Myoview and  echocardiogram prior to prescribing this.  The echocardiogram showed  normal LV function, but the Myoview was abnormal with apical ischemia  and exertional chest pain.  He was set up for an outpatient  catheterization which was done at the [facility] on May 27, 2008.  This revealed 80% in-stent restenosis of the previously placed LAD  stent, normal circumflex, normal ramus intermedius, and 40% RCA with  normal LV function.  The patient is admitted now for elective LAD  intervention.  This was done on June 03, 2008, by Dr. Jacinto Halim.  He  underwent intervention using a Promus stent with good result.  We feel  he can be discharged on June 04, 2008.   LABORATORY DATA:  White count is 4.9, hemoglobin 12.9, hematocrit 38.4,  platelets 231.  Sodium 39, potassium 4.3, BUN 9, creatinine 0.87.  CK-  MBs and troponins are negative x2.   EKG shows sinus rhythm, sinus  bradycardia with incomplete right bundle-branch block.   DISCHARGE MEDICATIONS:  1. Flomax 0.4 mg a day.  2. Fosinopril 10 mg a day.  3. Glipizide 10 mg twice a day.  4. Aspirin 81 mg a day.  5. Gabapentin 300 mg a day.  6. Nitroglycerin sublingual p.r.n.  7. Plavix 75 mg a day.  8. Crestor 10 mg a day.  9. Pepcid AC 20 mg a day.  He will hold his metformin until June 06, 2008, and then resume his 1 g b.i.d.   DISPOSITION:  The patient is discharged in stable condition and will  follow up with Dr. Jacinto Halim in [place] in a couple of weeks.      Abelino Derrick, P.A.      Cristy Hilts. Jacinto Halim, MD  Electronically Signed    LKK/MEDQ  D:  06/04/2008  T:  06/04/2008  Job:  [id number]   cc:   Soyla Murphy. Renne Crigler, M.D.

## 2010-12-08 NOTE — Op Note (Signed)
[name] [name], JULIA                           ACCOUNT NO.:  [id number]   MEDICAL RECORD NO.:  [id number]                   PATIENT TYPE:  AMB   LOCATION:  ENDO                                 FACILITY:  [facility]   PHYSICIAN:  John C. Madilyn Fireman, M.D.                 DATE OF BIRTH:  1945-11-14   DATE OF PROCEDURE:  06/16/2003  DATE OF DISCHARGE:                                 OPERATIVE REPORT   PROCEDURE:  Colonoscopy.   INDICATION FOR PROCEDURE:  Colon cancer screening in a 65 year old patient  with no prior screening.   DESCRIPTION OF PROCEDURE:  The patient was placed in the left lateral  decubitus position and placed on the pulse monitor with continuous low-flow  oxygen delivered by nasal cannula.  He was sedated with 50 mcg IV fentanyl  and 4 mg IV Versed.  The Olympus video colonoscope was inserted into the  rectum and advanced to the cecum, confirmed by transillumination of  McBurney's point and visualization of the ileocecal valve and appendiceal  orifice.  The prep was good.  The cecum, ascending, transverse, descending,  and sigmoid colon all appeared normal with no masses, polyps, diverticula,  or other mucosal abnormalities.  The rectum likewise appeared normal, and  retroflex view of the anus revealed no obvious internal hemorrhoids.  The  scope was then withdrawn and the patient returned to the recovery room in  stable condition.  He tolerated the procedure well, and there were no  immediate complications.   IMPRESSION:  Normal colonoscopy.   PLAN:  Next colon screening by sigmoidoscopy in five years.                                               John C. Madilyn Fireman, M.D.    JCH/MEDQ  D:  06/16/2003  T:  06/16/2003  Job:  [id number]   cc:   Ernestina Penna, M.D.  [address] [place]  [place] [address]  Fax: [phone]

## 2011-04-12 LAB — BASIC METABOLIC PANEL
BUN: 8
CO2: 25
Chloride: 102
Creatinine, Ser: 0.98

## 2011-04-12 LAB — DIFFERENTIAL
Basophils Relative: 0
Eosinophils Absolute: 0.1
Neutrophils Relative %: 84 — ABNORMAL HIGH

## 2011-04-12 LAB — CBC
MCHC: 33.7
MCV: 86.6
Platelets: 185
RDW: 13.5

## 2011-04-17 LAB — BASIC METABOLIC PANEL
CO2: 29
Calcium: 9.3
Creatinine, Ser: 0.82
GFR calc Af Amer: >60

## 2011-04-17 LAB — CBC
MCHC: 34.1
RBC: 3.94 — ABNORMAL LOW
WBC: 4.2

## 2011-04-17 LAB — TROPONIN I: Troponin I: 0.02

## 2011-04-17 LAB — CK TOTAL AND CKMB (NOT AT ARMC)
CK, MB: 2.2
Total CK: 147

## 2011-04-24 LAB — BASIC METABOLIC PANEL
BUN: 9
Calcium: 9.3
Creatinine, Ser: 0.87
GFR calc non Af Amer: >60
Potassium: 4.3

## 2011-04-24 LAB — CBC
HCT: 38.4 — ABNORMAL LOW
Platelets: 231
WBC: 4.9

## 2011-04-24 LAB — GLUCOSE, CAPILLARY
Glucose-Capillary: 142 — ABNORMAL HIGH
Glucose-Capillary: 176 — ABNORMAL HIGH
Glucose-Capillary: 197 — ABNORMAL HIGH
Glucose-Capillary: 344 — ABNORMAL HIGH

## 2011-04-24 LAB — CARDIAC PANEL(CRET KIN+CKTOT+MB+TROPI)
CK, MB: 1.5
Total CK: 92

## 2011-04-27 LAB — GLUCOSE, CAPILLARY: Glucose-Capillary: 209 mg/dL — ABNORMAL HIGH (ref 70–99)

## 2011-07-28 ENCOUNTER — Other Ambulatory Visit: Payer: Self-pay

## 2011-07-28 ENCOUNTER — Encounter: Payer: Self-pay | Admitting: *Deleted

## 2011-07-28 ENCOUNTER — Emergency Department (HOSPITAL_COMMUNITY): Payer: Medicare Other

## 2011-07-28 ENCOUNTER — Emergency Department (HOSPITAL_COMMUNITY)
Admission: EM | Admit: 2011-07-28 | Discharge: 2011-07-28 | Disposition: A | Discharge status: Home / Self Care | Payer: Medicare Other | Attending: Emergency Medicine | Admitting: Emergency Medicine

## 2011-07-28 DIAGNOSIS — I498 Other specified cardiac arrhythmias: Secondary | ICD-10-CM | POA: Insufficient documentation

## 2011-07-28 DIAGNOSIS — R739 Hyperglycemia, unspecified: Secondary | ICD-10-CM

## 2011-07-28 DIAGNOSIS — E119 Type 2 diabetes mellitus without complications: Secondary | ICD-10-CM | POA: Insufficient documentation

## 2011-07-28 DIAGNOSIS — Z87891 Personal history of nicotine dependence: Secondary | ICD-10-CM | POA: Insufficient documentation

## 2011-07-28 DIAGNOSIS — R42 Dizziness and giddiness: Secondary | ICD-10-CM | POA: Insufficient documentation

## 2011-07-28 DIAGNOSIS — I10 Essential (primary) hypertension: Secondary | ICD-10-CM | POA: Insufficient documentation

## 2011-07-28 DIAGNOSIS — I2510 Atherosclerotic heart disease of native coronary artery without angina pectoris: Secondary | ICD-10-CM | POA: Insufficient documentation

## 2011-07-28 HISTORY — DX: Atherosclerotic heart disease of native coronary artery without angina pectoris: I25.10

## 2011-07-28 HISTORY — DX: Essential (primary) hypertension: I10

## 2011-07-28 LAB — CBC
HCT: 37.3 % — ABNORMAL LOW (ref 39.0–52.0)
Hemoglobin: 12.7 g/dL — ABNORMAL LOW (ref 13.0–17.0)
RBC: 4.34 MIL/uL (ref 4.22–5.81)
RDW: 12.6 % (ref 11.5–15.5)
WBC: 5.9 10*3/uL (ref 4.0–10.5)

## 2011-07-28 LAB — DIFFERENTIAL
Basophils Absolute: 0 10*3/uL (ref 0.0–0.1)
Lymphocytes Relative: 13 % (ref 12–46)
Lymphs Abs: 0.7 10*3/uL (ref 0.7–4.0)
Monocytes Absolute: 0.3 10*3/uL (ref 0.1–1.0)
Neutro Abs: 4.8 10*3/uL (ref 1.7–7.7)

## 2011-07-28 LAB — BASIC METABOLIC PANEL
CO2: 26 mEq/L (ref 19–32)
Chloride: 102 mEq/L (ref 96–112)
Creatinine, Ser: 0.72 mg/dL (ref 0.50–1.35)
Glucose, Bld: 341 mg/dL — ABNORMAL HIGH (ref 70–99)
Sodium: 139 mEq/L (ref 135–145)

## 2011-07-28 LAB — URINALYSIS, ROUTINE W REFLEX MICROSCOPIC
Glucose, UA: >1000 mg/dL — AB
Leukocytes, UA: NEGATIVE
pH: 6.5 (ref 5.0–8.0)

## 2011-07-28 LAB — URINE MICROSCOPIC-ADD ON

## 2011-07-28 LAB — GLUCOSE, CAPILLARY
Glucose-Capillary: 316 mg/dL — ABNORMAL HIGH (ref 70–99)
Glucose-Capillary: 181 mg/dL — ABNORMAL HIGH (ref 70–99)

## 2011-07-28 MED ORDER — SODIUM CHLORIDE 0.9 % IV BOLUS (SEPSIS)
500.0000 mL | Freq: Once | INTRAVENOUS | Status: AC
  Administered 2011-07-28: 500 mL via INTRAVENOUS

## 2011-07-28 MED ORDER — MECLIZINE HCL 12.5 MG PO TABS
25.0000 mg | ORAL_TABLET | Freq: Once | ORAL | Status: AC
  Administered 2011-07-28: 25 mg via ORAL
  Filled 2011-07-28: qty 2

## 2011-07-28 MED ORDER — METOCLOPRAMIDE HCL 5 MG/ML IJ SOLN
10.0000 mg | Freq: Once | INTRAMUSCULAR | Status: AC
  Administered 2011-07-28: 10 mg via INTRAVENOUS
  Filled 2011-07-28: qty 2

## 2011-07-28 MED ORDER — MECLIZINE HCL 25 MG PO TABS: 25.0000 mg | ORAL_TABLET | Freq: Three times a day (TID) | ORAL | Status: AC | PRN

## 2011-07-28 NOTE — ED Notes (Signed)
Family at bedside. Patient does not need anything at this time. 

## 2011-07-28 NOTE — ED Provider Notes (Signed)
History     CSN: [id number]  Arrival date & time 07/28/11  1305   First MD Initiated Contact with Patient 07/28/11 1339      Chief Complaint  Patient presents with  . Dizziness    (Consider location/radiation/quality/duration/timing/severity/associated sxs/prior treatment) HPI Pt woke up this AM feeling dizzy and his balance was off.  Pt felt nauseated today as well.  Pt feels better laying down.  The symptoms increase when upright.  He had to hold on to objects in order not to fall.  Pt denies headache.  No trouble with speech or strength.  No prior problems like this.  No chest pain or shortness of breath.   Past Medical History  Diagnosis Date  . Diabetes mellitus   . Coronary artery disease   . Hypertension     Past Surgical History  Procedure Date  . Coronary stent placement   . Back surgery     History reviewed. No pertinent family history.  History  Substance Use Topics  . Smoking status: Former Games developer  . Smokeless tobacco: Not on file  . Alcohol Use: No      Review of Systems  Constitutional: Negative for fever.  Eyes: Negative for photophobia and visual disturbance.  Respiratory: Negative for cough.   Cardiovascular: Negative for chest pain.  Musculoskeletal: Positive for gait problem.  Psychiatric/Behavioral: Negative for confusion.  All other systems reviewed and are negative.    Allergies  Review of patient's allergies indicates no known allergies.  Home Medications  No current outpatient prescriptions on file.  BP 135/71  Pulse 63  Temp(Src) 98.7 F (37.1 C) (Oral)  Resp 16  Ht 5\' 11"  (1.803 m)  Wt 156 lb (70.761 kg)  BMI 21.76 kg/m2  SpO2 98%  Physical Exam  Nursing note and vitals reviewed. Constitutional: He is oriented to person, place, and time. He appears well-developed and well-nourished. No distress.  HENT:  Head: Normocephalic and atraumatic.  Right Ear: External ear normal.  Left Ear: External ear normal.  Mouth/Throat:  Oropharynx is clear and moist.  Eyes: Conjunctivae are normal. Right eye exhibits no discharge. Left eye exhibits no discharge. No scleral icterus.  Neck: Neck supple. No tracheal deviation present.  Cardiovascular: Normal rate, regular rhythm and intact distal pulses.   Pulmonary/Chest: Effort normal and breath sounds normal. No stridor. No respiratory distress. He has no wheezes. He has no rales.  Abdominal: Soft. Bowel sounds are normal. He exhibits no distension. There is no tenderness. There is no rebound and no guarding.  Musculoskeletal: He exhibits no edema and no tenderness.  Neurological: He is alert and oriented to person, place, and time. He has normal strength. No cranial nerve deficit ( no gross defecits noted) or sensory deficit. He exhibits normal muscle tone. He displays no seizure activity. Coordination normal.       No pronator drift bilateral upper extrem, able to hold both legs off bed for 5 seconds, sensation intact in all extremities, no visual field cuts, no left or right sided neglect  Skin: Skin is warm and dry. No rash noted.  Psychiatric: He has a normal mood and affect.    ED Course  Procedures (including critical care time)  Date: 07/28/2011  Rate: 59   Rhythm: sinus bradycardia  QRS Axis: normal  Intervals: normal  ST/T Wave abnormalities: nonspecific T wave changes  Conduction Disutrbances:none  Narrative Interpretation:   Old EKG Reviewed: nonspecific t wave changes   Labs Reviewed  GLUCOSE, CAPILLARY - Abnormal;  Notable for the following:    Glucose-Capillary 316 (*)    All other components within normal limits  CBC - Abnormal; Notable for the following:    Hemoglobin 12.7 (*)    HCT 37.3 (*)    All other components within normal limits  DIFFERENTIAL - Abnormal; Notable for the following:    Neutrophils Relative 83 (*)    All other components within normal limits  BASIC METABOLIC PANEL - Abnormal; Notable for the following:    Glucose, Bld 341  (*)    All other components within normal limits  URINALYSIS, ROUTINE W REFLEX MICROSCOPIC - Abnormal; Notable for the following:    Glucose, UA >1000 (*)    Hgb urine dipstick TRACE (*)    Ketones, ur TRACE (*)    All other components within normal limits  URINE MICROSCOPIC-ADD ON  POCT CBG MONITORING   Ct Head Wo Contrast  07/28/2011  *RADIOLOGY REPORT*  Clinical Data: Dizziness  CT HEAD WITHOUT CONTRAST  Technique:  Contiguous axial images were obtained from the base of the skull through the vertex without contrast.  Comparison: MRI brain dated 08/12/2007  Findings: No evidence of parenchymal hemorrhage or extra-axial fluid collection. No mass lesion, mass effect, or midline shift.  No CT evidence of acute infarction.  Cerebral volume is age appropriate.  No ventriculomegaly.  The visualized paranasal sinuses are essentially clear. The mastoid air cells are unopacified.  No evidence of calvarial fracture.  IMPRESSION: No evidence of acute intracranial abnormality.  Original Report Authenticated By: Charline Bills, M.D.     Diagnosis: Vertigo, hyperglycemia  MDM  Patient's symptoms are suggestive of vertigo. Patient does have hyperglycemia however I do not feel that this is elevated enough to cause his symptoms. Patient did not describe clear positional component to suggest peripheral vertigo initially.  MRI is not available at this time. Patient currently is feeling much better. He is able to ambulate in the ED without difficulty. He states he no longer feels dizzy or unsteady. Considering this, at this time I feel that it is reasonable for him to be discharged home and closely follow up with his doctor. I explained to him that he should return if he has any recurrent symptoms. MRI testing would be indicated. This may be a case of peripheral vertigo.        Celene Kras, MD 07/28/11 [phone]

## 2011-07-28 NOTE — ED Notes (Signed)
Pt c/o dizziness and being unable to get his balance since this am. Also c/o nausea.

## 2011-07-28 NOTE — ED Notes (Signed)
Family at bedside. Patient nor family need anything at this time. 

## 2013-02-23 ENCOUNTER — Encounter: Payer: Self-pay | Admitting: *Deleted

## 2013-02-26 ENCOUNTER — Ambulatory Visit (INDEPENDENT_AMBULATORY_CARE_PROVIDER_SITE_OTHER): Payer: Medicare Other | Admitting: Internal Medicine

## 2013-02-26 ENCOUNTER — Encounter: Payer: Self-pay | Admitting: Internal Medicine

## 2013-02-26 VITALS — BP 122/72 | HR 55 | Ht 71.5 in | Wt 148.6 lb

## 2013-02-26 DIAGNOSIS — I2510 Atherosclerotic heart disease of native coronary artery without angina pectoris: Secondary | ICD-10-CM

## 2013-02-26 DIAGNOSIS — E119 Type 2 diabetes mellitus without complications: Secondary | ICD-10-CM

## 2013-02-26 DIAGNOSIS — I10 Essential (primary) hypertension: Secondary | ICD-10-CM

## 2013-02-26 DIAGNOSIS — E785 Hyperlipidemia, unspecified: Secondary | ICD-10-CM

## 2013-02-26 MED ORDER — ROSUVASTATIN CALCIUM 20 MG PO TABS: 20.0000 mg | ORAL_TABLET | Freq: Every day | ORAL | Status: DC

## 2013-02-26 NOTE — Patient Instructions (Addendum)
Your physician wants you to follow-up in: 1 year. You will receive a reminder letter in the mail two months in advance. If you don't receive a letter, please call our office to schedule the follow-up appointment.  

## 2013-02-26 NOTE — Progress Notes (Signed)
OFFICE NOTE  Chief Complaint:  Routine office visit  Primary Care Physician: Evlyn Courier, MD  HPI:  [name] [name] a 67 year old gentleman with a history of coronary artery disease, status post stent to mid LAD in 2009. 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 He also has hypertension, diabetes, and dyslipidemia. All these have been well controlled. 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  PMHx:  Past Medical History  Diagnosis Date  . Diabetes mellitus   . Coronary artery disease   . Hypertension   . Dyslipidemia   . History of nuclear stress test 01/12/2010    low risk scan; ekg negative for ischemia    Past Surgical History  Procedure Laterality Date  . Coronary stent placement  06/03/2008    Promus stent to mid LAD; 10/23/2007 Vision stent to mid LAD  . Back surgery  1992  . Femoral endarterectomy  2010    right, with bovine patch angioplasty;  post cath, r./t aneursym/claudication  . Transthoracic echocardiogram  01/12/2010    EF>55%; mild concentric LVH; mild MR, mild TR    FAMHx:  Family History  Problem Relation Age of Onset  . Cancer Brother   . Kidney disease Mother   . Heart Problems Mother     SOCHx:   reports that he has never smoked. He has never used smokeless tobacco. He reports that he does not drink alcohol or use illicit drugs.  ALLERGIES:  No Known Allergies  ROS: A comprehensive review of systems was negative.  HOME MEDS: Current Outpatient Prescriptions  Medication Sig Dispense Refill  . aspirin EC 81 MG tablet Take 81 mg by mouth daily.        . carvedilol (COREG) 3.125 MG tablet Take 3.125 mg by mouth 2 (two) times daily with a meal.        . fosinopril (MONOPRIL) 40 MG tablet Take 20 mg by mouth daily.       . metFORMIN (GLUCOPHAGE) 1000  MG tablet Take 1,000 mg by mouth 2 (two) times daily with a meal.        . rosuvastatin (CRESTOR) 20 MG tablet Take 1 tablet (20 mg total) by mouth daily.  28 tablet  0   No current facility-administered medications for this visit.    LABS/IMAGING: No results found for this or any previous visit (from the past 48 hour(s)). No results found.  VITALS: BP 122/72  Pulse 55  Ht 5' 11.5" (1.816 m)  Wt 148 lb 9.6 oz (67.405 kg)  BMI 20.44 kg/m2  EXAM: General appearance: alert and no distress Neck: no adenopathy, no carotid bruit, no JVD, supple, symmetrical, trachea midline and thyroid not enlarged, symmetric, no tenderness/mass/nodules Lungs: clear to auscultation bilaterally Heart: regular rate and rhythm, S1, S2 normal, no murmur, click, rub or gallop Abdomen: soft, non-tender; bowel sounds normal; no masses,  no organomegaly Extremities: extremities normal, atraumatic, no cyanosis or edema Pulses: 2+ and symmetric Skin: Skin color, texture, turgor normal. No rashes or lesions Neurologic: Grossly normal  EKG: Bradycardia with anterior T wave inversions and 55, no changes  ASSESSMENT: 1. Coronary artery disease status post PCI to the mid LAD in 2009 2. Hypertension 3. Dyslipidemia 4. Diabetes type 2 - recently started on Onglyza  PLAN: 1.   Mr. Lowrey is doing very well from a cardiac  standpoint. He denies any chest pain or worsening shortness of breath or new symptomatology. He is quite active but does not do any physical exercise. Did recommend a structured exercise program for him. Reports a good energy level sleeps well at night. His blood pressure is adequately controlled and his cholesterol was checked recently and is within normal limits. He is on Crestor 20 mg, which I decreased him to about a year ago. He also has diabetes type 2 and I believe his metformin was switched to Onglyza recently. He has not started that new medication.  He is doing well we'll see him back in a  year.  [name], MD, [facility] Attending Cardiologist [facility]  Kveon Casanas C 02/26/2013, 9:01 AM

## 2013-07-29 ENCOUNTER — Ambulatory Visit (INDEPENDENT_AMBULATORY_CARE_PROVIDER_SITE_OTHER): Payer: Medicare Other | Admitting: Internal Medicine

## 2013-07-29 ENCOUNTER — Encounter: Payer: Self-pay | Admitting: Internal Medicine

## 2013-07-29 VITALS — BP 146/80 | HR 62 | Ht 66.0 in | Wt 152.3 lb

## 2013-07-29 DIAGNOSIS — E119 Type 2 diabetes mellitus without complications: Secondary | ICD-10-CM

## 2013-07-29 DIAGNOSIS — I10 Essential (primary) hypertension: Secondary | ICD-10-CM

## 2013-07-29 DIAGNOSIS — E785 Hyperlipidemia, unspecified: Secondary | ICD-10-CM

## 2013-07-29 DIAGNOSIS — Z01810 Encounter for preprocedural cardiovascular examination: Secondary | ICD-10-CM

## 2013-07-29 DIAGNOSIS — I2510 Atherosclerotic heart disease of native coronary artery without angina pectoris: Secondary | ICD-10-CM

## 2013-07-29 NOTE — Patient Instructions (Signed)
Your physician wants you to follow-up in: 1 year with Dr. Hilty. You will receive a reminder letter in the mail two months in advance. If you don't receive a letter, please call our office to schedule the follow-up appointment.  

## 2013-07-29 NOTE — Progress Notes (Signed)
OFFICE NOTE  Chief Complaint:  Routine office visit  Primary Care Physician: [name] Courier, MD  HPI:  [name] [name] a 68 year old gentleman with a history of coronary artery disease, status post stent to mid LAD in 2009. 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 He also has hypertension, diabetes, and dyslipidemia. All these have been well controlled. 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  He presents today for preoperative clearance for an upcoming inguinal hernia surgery on the left. He recently saw Dr. Malvin [name] who recommended surgery. Again he reports being asymptomatic, denying chest pain or worsening shortness of breath. He is able to walk up a flight of stairs without getting short of breath or having to stop.  PMHx:  Past Medical History  Diagnosis Date  . Diabetes mellitus   . Coronary artery disease   . Hypertension   . Dyslipidemia   . History of nuclear stress test 01/12/2010    low risk scan; ekg negative for ischemia    Past Surgical History  Procedure Laterality Date  . Coronary stent placement  06/03/2008    Promus stent to mid LAD; 10/23/2007 Vision stent to mid LAD  . Back surgery  1992  . Femoral endarterectomy  2010    right, with bovine patch angioplasty;  post cath, r./t aneursym/claudication  . Transthoracic echocardiogram  01/12/2010    EF>55%; mild concentric LVH; mild MR, mild TR    FAMHx:  Family History  Problem Relation Age of Onset  . Cancer Brother   . Kidney disease Mother   . Heart Problems Mother     SOCHx:   reports that he has never smoked. He has never used smokeless tobacco. He reports that he does not drink alcohol or use illicit drugs.  ALLERGIES:  No Known Allergies  ROS: A comprehensive review of systems was  negative.  HOME MEDS: Current Outpatient Prescriptions  Medication Sig Dispense Refill  . Alogliptin-Pioglitazone (OSENI) 25-15 MG TABS Take 1 tablet by mouth daily.      Marland Kitchen aspirin EC 81 MG tablet Take 81 mg by mouth daily.        . carvedilol (COREG) 3.125 MG tablet Take 3.125 mg by mouth 2 (two) times daily with a meal.        . fosinopril (MONOPRIL) 40 MG tablet Take 20 mg by mouth daily.       . metFORMIN (GLUCOPHAGE) 1000 MG tablet Take 1,000 mg by mouth 2 (two) times daily with a meal.        . rosuvastatin (CRESTOR) 20 MG tablet Take 1 tablet (20 mg total) by mouth daily.  28 tablet  0   No current facility-administered medications for this visit.    LABS/IMAGING: No results found for this or any previous visit (from the past 48 hour(s)). No results found.  VITALS: BP 146/80  Pulse 62  Ht 5\' 6"  (1.676 m)  Wt 152 lb 4.8 oz (69.083 kg)  BMI 24.59 kg/m2  EXAM: General appearance: alert and no distress Neck: no adenopathy, no carotid bruit, no JVD, supple, symmetrical, trachea midline and thyroid not enlarged, symmetric, no tenderness/mass/nodules Lungs: clear to auscultation bilaterally Heart: regular rate and rhythm, S1, S2 normal, no murmur, click, rub or gallop Abdomen: soft, non-tender; bowel sounds normal; no masses,  no organomegaly Extremities: extremities normal, atraumatic, no  cyanosis or edema Pulses: 2+ and symmetric Skin: Skin color, texture, turgor normal. No rashes or lesions Neurologic: Grossly normal  EKG: Normal sinus rhythm at 62  ASSESSMENT: 1. Coronary artery disease status post PCI to the mid LAD in 2009 2. Hypertension 3. Dyslipidemia 4. Low risk for upcoming hernia surgery 5. Diabetes type 2 - recently started on Onglyza  PLAN: 1.   [name] [name] is doing very well from a cardiac standpoint. He denies any chest pain or worsening shortness of breath or new symptomatology. He is quite active but does not do any physical exercise. I do feel that he  is at low risk for his upcoming hernia surgery. He should continue his current medications, especially his carvedilol perioperatively. Will plan to see him back annually or sooner as necessary.  Chrystie NoseKenneth C. Cayleen Benjamin, MD, [facility] Attending Cardiologist [facility]  Quida Glasser C 07/29/2013, 6:38 PM

## 2013-07-30 ENCOUNTER — Encounter (HOSPITAL_COMMUNITY): Payer: Self-pay | Admitting: Pharmacy Technician

## 2013-08-03 ENCOUNTER — Encounter (HOSPITAL_COMMUNITY): Payer: Self-pay

## 2013-08-03 ENCOUNTER — Encounter (HOSPITAL_COMMUNITY)
Admission: RE | Admit: 2013-08-03 | Discharge: 2013-08-03 | Disposition: A | Discharge status: Home / Self Care | Payer: Medicare Other | Source: Ambulatory Visit | Attending: General Surgery | Admitting: General Surgery

## 2013-08-03 LAB — BASIC METABOLIC PANEL
BUN: 13 mg/dL (ref 6–23)
CO2: 32 mEq/L (ref 19–32)
Calcium: 10 mg/dL (ref 8.4–10.5)
Chloride: 101 mEq/L (ref 96–112)
Creatinine, Ser: 0.98 mg/dL (ref 0.50–1.35)
GFR calc non Af Amer: 83 mL/min — ABNORMAL LOW (ref >90)
Glucose, Bld: 210 mg/dL — ABNORMAL HIGH (ref 70–99)
POTASSIUM: 5 meq/L (ref 3.7–5.3)
Sodium: 144 mEq/L (ref 137–147)

## 2013-08-03 LAB — CBC WITH DIFFERENTIAL/PLATELET
BASOS PCT: 1 % (ref 0–1)
Basophils Absolute: 0 10*3/uL (ref 0.0–0.1)
Eosinophils Absolute: 0.2 10*3/uL (ref 0.0–0.7)
Eosinophils Relative: 3 % (ref 0–5)
HCT: 38.2 % — ABNORMAL LOW (ref 39.0–52.0)
HEMOGLOBIN: 13.1 g/dL (ref 13.0–17.0)
LYMPHS ABS: 1.6 10*3/uL (ref 0.7–4.0)
Lymphocytes Relative: 33 % (ref 12–46)
MCH: 30.3 pg (ref 26.0–34.0)
MCHC: 34.3 g/dL (ref 30.0–36.0)
MCV: 88.2 fL (ref 78.0–100.0)
MONOS PCT: 9 % (ref 3–12)
Monocytes Absolute: 0.4 10*3/uL (ref 0.1–1.0)
NEUTROS PCT: 54 % (ref 43–77)
Neutro Abs: 2.6 10*3/uL (ref 1.7–7.7)
Platelets: 217 10*3/uL (ref 150–400)
RBC: 4.33 MIL/uL (ref 4.22–5.81)
RDW: 13.2 % (ref 11.5–15.5)
WBC: 4.7 10*3/uL (ref 4.0–10.5)

## 2013-08-03 NOTE — Patient Instructions (Signed)
[name] KinsmanJimmie [name]  08/03/2013   Your procedure is scheduled on:   08/06/2013  [facility]  [facility].  Call this number if you have problems the morning of surgery: [phone]   Remember:   Do not eat food or drink liquids after midnight.   Take these medicines the morning of surgery with A SIP OF WATER:  Coreg, monopril   Do not wear jewelry, make-up or nail polish.  Do not wear lotions, powders, or perfumes.   Do not shave 48 hours prior to surgery. Men may shave face and neck.  Do not bring valuables to the hospital.  [facility] not responsible for any belongings or valuables.               Contacts, dentures or bridgework may not be worn into surgery.  Leave suitcase in the car. After surgery it may be brought to your room.  For patients admitted to the hospital, discharge time is determined by your treatment team.               Patients discharged the day of surgery will not be allowed to drive home.  Name and phone number of your driver: family  Special Instructions: Shower using CHG 2 nights before surgery and the night before surgery.  If you shower the day of surgery use CHG.  Use special wash - you have one bottle of CHG for all showers.  You should use approximately 1/3 of the bottle for each shower.   Please read over the following fact sheets that you were given: Pain Booklet, Coughing and Deep Breathing, Surgical Site Infection Prevention, Anesthesia Post-op Instructions and Care and Recovery After Surgery Hernia A hernia happens when an organ inside your body pushes out through a weak spot in your belly (abdominal) wall. Most hernias get worse over time. They can often be pushed back into place (reduced). Surgery may be needed to repair hernias that cannot be pushed into place. HOME CARE  Keep doing normal activities.  Avoid lifting more than 10 pounds (4.5 kilograms).  Cough gently and avoid straining. Over time, these things will:  Increase your  hernia size.  Irritate your hernia.  Break down hernia repairs.  Stop smoking.  Do not wear anything tight over your hernia. Do not keep the hernia in with an outside bandage.  Eat food that is high in fiber (fruit, vegetables, whole grains).  Drink enough fluids to keep your pee (urine) clear or pale yellow.  Take medicines to make your poop soft (stool softeners) if you cannot poop (constipated). GET HELP RIGHT AWAY IF:   You have a fever.  You have belly pain that gets worse.  You feel sick to your stomach (nauseous) and throw up (vomit).  Your skin starts to bulge out.  Your hernia turns a different color, feels hard, or is tender.  You have increased pain or puffiness (swelling) around the hernia.  You poop more or less often.  Your poop does not look the way normally does.  You have watery poop (diarrhea).  You cannot push the hernia back in place by applying gentle pressure while lying down. MAKE SURE YOU:   Understand these instructions.  Will watch your condition.  Will get help right away if you are not doing well or get worse. Document Released: 12/27/2009 Document Revised: 10/01/2011 Document Reviewed: 12/27/2009 [facility] Patient Information 2014 [employer] [place]. PATIENT INSTRUCTIONS POST-ANESTHESIA  IMMEDIATELY FOLLOWING  SURGERY:  Do not drive or operate machinery for the first twenty four hours after surgery.  Do not make any important decisions for twenty four hours after surgery or while taking narcotic pain medications or sedatives.  If you develop intractable nausea and vomiting or a severe headache please notify your doctor immediately.  FOLLOW-UP:  Please make an appointment with your surgeon as instructed. You do not need to follow up with anesthesia unless specifically instructed to do so.  WOUND CARE INSTRUCTIONS (if applicable):  Keep a dry clean dressing on the anesthesia/puncture wound site if there is drainage.  Once the wound has quit  draining you may leave it open to air.  Generally you should leave the bandage intact for twenty four hours unless there is drainage.  If the epidural site drains for more than 36-48 hours please call the anesthesia department.  QUESTIONS?:  Please feel free to call your physician or the hospital operator if you have any questions, and they will be happy to assist you.

## 2013-08-04 NOTE — Consult Note (Signed)
[name]:  [name], [name]               ACCOUNT NO.:  [id number]  MEDICAL RECORD NO.:  [id number]  LOCATION:  DOIB                          FACILITY:  [facility]  PHYSICIAN:  Barbaraann BarthelWilliam Arlicia [name], M.D. DATE OF BIRTH:  [birth year]  DATE OF CONSULTATION:  08/06/2013 DATE OF DISCHARGE:  08/07/2013                                CONSULTATION   DIAGNOSIS:  Left inguinal hernia.  NOTE:  This is a 68 year old male who was referred for repair of left inguinal hernia.  He was noted to have this for at least the last 9 months.  He was referred from Dr. Adaline SillHill's office for repair.  PAST MEDICAL HISTORY:  Significant for hypertension, diabetes, and coronary artery disease.  PAST SURGERIES:  Cardiac stent placed in 2009, he had back surgery for L4 and L5 for a bulging disk prior to that in 1993.  SOCIAL HISTORY:  The patient is a nonsmoker, nondrinker.  ALLERGIES:  No allergies.  For medications, please consult medication list.  PHYSICAL EXAMINATION:  VITAL SIGNS:  He is 5 feet 11 inches tall, weighs 145 pounds, temperature is 98.9, pulse is 60 per minute, respirations 12, blood pressure 116/60. HEENT:  Head is normocephalic.  Eyes, extraocular movements are intact. Pupils are round and reactive to light and accommodation.  There is no conjunctival pallor or scleral injection.  The sclera has a normal tincture. NECK:  There are no bruits and no cervical adenopathy or thyromegaly. CHEST:  Clear both to anterior and posterior auscultation. HEART:  Regular rhythm. ABDOMEN:  Soft.  The patient has an obvious left inguinal hernia that is reducible. RECTAL:  Done by Dr. Loleta [name], this was refused in the office. EXTREMITIES:  Within normal limits.  REVIEW OF SYSTEMS:  NEURO:  No lateralizing neurological findings.  No history of migraines or seizures.  ENDOCRINE:  The patient has diabetes, no history of thyroid disease or adrenal problems.  CARDIOPULMONARY: History of hypertension, coronary artery disease with  coronary stent placement in 2009.  MUSCULOSKELETAL:  Grossly within normal limits.  GI: No history of hepatitis, constipation, diarrhea, bright red rectal bleeding, or black tarry stools.  No history of inflammatory bowel disease or irritable bowel syndrome.  No history of unexplained weight loss.  Last colonoscopy was in 2010.  GU:  No history of frequency, dysuria, or nephrolithiasis.  REVIEW OF HISTORY AND PHYSICAL:  Therefore, Mr. [name] [name] is a 68 year old male who will have a left inguinal hernia repair.  This was discussed in detail with him including complications not limited to, but including bleeding, infection, and recurrence, and the possible placement of mesh prosthesis.  Informed consent was obtained.  Prior to surgery, he had cardiac clearance by the Cardiology Service.  We will plan for surgery via the outpatient department, and I would thank Dr. Loleta [name] for this kind referral.     Barbaraann BarthelWilliam Carlisia [name], M.D.     WB/MEDQ  D:  08/03/2013  T:  08/04/2013  Job:  [id number]  cc:   Annia FriendlyGerald K. Loleta ChanceHill, MD Fax: [phone]  [name] Hilty, MD Fax: [phone]

## 2013-08-04 NOTE — Consult Note (Deleted)
NAME:  [name] [name], [name] [name]               ACCOUNT NO.:  [id number]  MEDICAL RECORD NO.:  [id number]  LOCATION:  DOIB                          FACILITY:  [facility]  PHYSICIAN:  Irvan Tiedt, M.D. DATE OF BIRTH:  10/14/1945  DATE OF CONSULTATION:  08/06/2013 DATE OF DISCHARGE:  08/07/2013                                CONSULTATION   DIAGNOSIS:  Left inguinal hernia.  NOTE:  This is a 68-year-old male who was referred for repair of left inguinal hernia.  He was noted to have this for at least the last 9 months.  He was referred from Dr. Hill's office for repair.  PAST MEDICAL HISTORY:  Significant for hypertension, diabetes, and coronary artery disease.  PAST SURGERIES:  Cardiac stent placed in 2009, he had back surgery for L4 and L5 for a bulging disk prior to that in 1993.  SOCIAL HISTORY:  The patient is a nonsmoker, nondrinker.  ALLERGIES:  No allergies.  For medications, please consult medication list.  PHYSICAL EXAMINATION:  VITAL SIGNS:  He is 5 feet 11 inches tall, weighs 145 pounds, temperature is 98.9, pulse is 60 per minute, respirations 12, blood pressure 116/60. HEENT:  Head is normocephalic.  Eyes, extraocular movements are intact. Pupils are round and reactive to light and accommodation.  There is no conjunctival pallor or scleral injection.  The sclera has a normal tincture. NECK:  There are no bruits and no cervical adenopathy or thyromegaly. CHEST:  Clear both to anterior and posterior auscultation. HEART:  Regular rhythm. ABDOMEN:  Soft.  The patient has an obvious left inguinal hernia that is reducible. RECTAL:  Done by Dr. Hill, this was refused in the office. EXTREMITIES:  Within normal limits.  REVIEW OF SYSTEMS:  NEURO:  No lateralizing neurological findings.  No history of migraines or seizures.  ENDOCRINE:  The patient has diabetes, no history of thyroid disease or adrenal problems.  CARDIOPULMONARY: History of hypertension, coronary artery disease with  coronary stent placement in 2009.  MUSCULOSKELETAL:  Grossly within normal limits.  GI: No history of hepatitis, constipation, diarrhea, bright red rectal bleeding, or black tarry stools.  No history of inflammatory bowel disease or irritable bowel syndrome.  No history of unexplained weight loss.  Last colonoscopy was in 2010.  GU:  No history of frequency, dysuria, or nephrolithiasis.  REVIEW OF HISTORY AND PHYSICAL:  Therefore, [name] [name] is a 68-year-old male who will have a left inguinal hernia repair.  This was discussed in detail with him including complications not limited to, but including bleeding, infection, and recurrence, and the possible placement of mesh prosthesis.  Informed consent was obtained.  Prior to surgery, he had cardiac clearance by the Cardiology Service.  We will plan for surgery via the outpatient department, and I would thank Dr. Hill for this kind referral.     Sohil Timko, M.D.     WB/MEDQ  D:  08/03/2013  T:  08/04/2013  Job:  [id number]  cc:   Gerald K. Hill, MD Fax: [phone]  Chad Hilty, MD Fax: [phone] 

## 2013-08-06 ENCOUNTER — Ambulatory Visit (HOSPITAL_COMMUNITY)
Admission: RE | Admit: 2013-08-06 | Discharge: 2013-08-07 | Disposition: A | Discharge status: Home / Self Care | Payer: Medicare Other | Source: Ambulatory Visit | Attending: General Surgery | Admitting: General Surgery

## 2013-08-06 ENCOUNTER — Encounter (HOSPITAL_COMMUNITY): Payer: Medicare Other | Admitting: Anesthesiology

## 2013-08-06 ENCOUNTER — Encounter (HOSPITAL_COMMUNITY): Payer: Self-pay

## 2013-08-06 ENCOUNTER — Ambulatory Visit (HOSPITAL_COMMUNITY): Payer: Medicare Other | Admitting: Anesthesiology

## 2013-08-06 ENCOUNTER — Encounter (HOSPITAL_COMMUNITY)
Admission: RE | Disposition: A | Discharge status: Home / Self Care | Payer: Self-pay | Source: Ambulatory Visit | Attending: General Surgery

## 2013-08-06 DIAGNOSIS — K4090 Unilateral inguinal hernia, without obstruction or gangrene, not specified as recurrent: Secondary | ICD-10-CM | POA: Diagnosis present

## 2013-08-06 DIAGNOSIS — D649 Anemia, unspecified: Secondary | ICD-10-CM

## 2013-08-06 DIAGNOSIS — F528 Other sexual dysfunction not due to a substance or known physiological condition: Secondary | ICD-10-CM

## 2013-08-06 DIAGNOSIS — E785 Hyperlipidemia, unspecified: Secondary | ICD-10-CM

## 2013-08-06 DIAGNOSIS — IMO0002 Reserved for concepts with insufficient information to code with codable children: Secondary | ICD-10-CM

## 2013-08-06 DIAGNOSIS — I2510 Atherosclerotic heart disease of native coronary artery without angina pectoris: Secondary | ICD-10-CM

## 2013-08-06 DIAGNOSIS — Z01810 Encounter for preprocedural cardiovascular examination: Secondary | ICD-10-CM

## 2013-08-06 DIAGNOSIS — R9389 Abnormal findings on diagnostic imaging of other specified body structures: Secondary | ICD-10-CM

## 2013-08-06 DIAGNOSIS — R9431 Abnormal electrocardiogram [ECG] [EKG]: Secondary | ICD-10-CM

## 2013-08-06 DIAGNOSIS — I10 Essential (primary) hypertension: Secondary | ICD-10-CM

## 2013-08-06 DIAGNOSIS — Z01812 Encounter for preprocedural laboratory examination: Secondary | ICD-10-CM | POA: Insufficient documentation

## 2013-08-06 DIAGNOSIS — T148 Other injury of unspecified body region: Secondary | ICD-10-CM

## 2013-08-06 DIAGNOSIS — K5900 Constipation, unspecified: Secondary | ICD-10-CM

## 2013-08-06 DIAGNOSIS — W57XXXA Bitten or stung by nonvenomous insect and other nonvenomous arthropods, initial encounter: Secondary | ICD-10-CM

## 2013-08-06 DIAGNOSIS — Z23 Encounter for immunization: Secondary | ICD-10-CM | POA: Insufficient documentation

## 2013-08-06 DIAGNOSIS — R55 Syncope and collapse: Secondary | ICD-10-CM

## 2013-08-06 DIAGNOSIS — E119 Type 2 diabetes mellitus without complications: Secondary | ICD-10-CM

## 2013-08-06 HISTORY — PX: INGUINAL HERNIA REPAIR: SHX194

## 2013-08-06 LAB — GLUCOSE, CAPILLARY
GLUCOSE-CAPILLARY: 135 mg/dL — AB (ref 70–99)
GLUCOSE-CAPILLARY: 84 mg/dL (ref 70–99)
Glucose-Capillary: 70 mg/dL (ref 70–99)
Glucose-Capillary: 60 mg/dL — ABNORMAL LOW (ref 70–99)
Glucose-Capillary: 205 mg/dL — ABNORMAL HIGH (ref 70–99)
Glucose-Capillary: 199 mg/dL — ABNORMAL HIGH (ref 70–99)

## 2013-08-06 SURGERY — REPAIR, HERNIA, INGUINAL, ADULT
Anesthesia: Monitor Anesthesia Care | Site: Groin | Laterality: Left

## 2013-08-06 MED ORDER — LIDOCAINE IN DEXTROSE 5-7.5 % IV SOLN
INTRAVENOUS | Status: AC
  Filled 2013-08-06: qty 2

## 2013-08-06 MED ORDER — BUPIVACAINE HCL (PF) 0.5 % IJ SOLN
INTRAMUSCULAR | Status: AC
  Filled 2013-08-06: qty 30

## 2013-08-06 MED ORDER — MIDAZOLAM HCL 2 MG/2ML IJ SOLN
INTRAMUSCULAR | Status: AC
  Filled 2013-08-06: qty 2

## 2013-08-06 MED ORDER — DEXTROSE 50 % IV SOLN
12.5000 g | Freq: Once | INTRAVENOUS | Status: AC
  Administered 2013-08-06: 12.5 g via INTRAVENOUS

## 2013-08-06 MED ORDER — FENTANYL CITRATE 0.05 MG/ML IJ SOLN
INTRAMUSCULAR | Status: DC | PRN
  Administered 2013-08-06 (×4): 12.5 ug via INTRAVENOUS

## 2013-08-06 MED ORDER — CARVEDILOL 3.125 MG PO TABS
3.1250 mg | ORAL_TABLET | Freq: Two times a day (BID) | ORAL | Status: DC
  Administered 2013-08-07: 3.125 mg via ORAL
  Filled 2013-08-06: qty 1

## 2013-08-06 MED ORDER — INSULIN ASPART 100 UNIT/ML ~~LOC~~ SOLN
0.0000 [IU] | Freq: Three times a day (TID) | SUBCUTANEOUS | Status: DC
  Administered 2013-08-06 – 2013-08-07 (×3): 3 [IU] via SUBCUTANEOUS

## 2013-08-06 MED ORDER — FENTANYL CITRATE 0.05 MG/ML IJ SOLN
INTRAMUSCULAR | Status: AC
  Filled 2013-08-06: qty 2

## 2013-08-06 MED ORDER — LACTATED RINGERS IV SOLN
INTRAVENOUS | Status: DC
  Administered 2013-08-06: 07:00:00 via INTRAVENOUS

## 2013-08-06 MED ORDER — ALOGLIPTIN-PIOGLITAZONE 25-15 MG PO TABS: 1.0000 | ORAL_TABLET | Freq: Every day | ORAL | Status: DC

## 2013-08-06 MED ORDER — PROPOFOL INFUSION 10 MG/ML OPTIME
INTRAVENOUS | Status: DC | PRN
  Administered 2013-08-06: 75 ug/kg/min via INTRAVENOUS

## 2013-08-06 MED ORDER — LIDOCAINE HCL (PF) 1 % IJ SOLN
INTRAMUSCULAR | Status: AC
  Filled 2013-08-06: qty 5

## 2013-08-06 MED ORDER — MIDAZOLAM HCL 2 MG/2ML IJ SOLN
1.0000 mg | INTRAMUSCULAR | Status: DC | PRN
  Administered 2013-08-06: 2 mg via INTRAVENOUS

## 2013-08-06 MED ORDER — BUPIVACAINE HCL (PF) 0.5 % IJ SOLN
INTRAMUSCULAR | Status: DC | PRN
  Administered 2013-08-06: 10 mL

## 2013-08-06 MED ORDER — ATORVASTATIN CALCIUM 20 MG PO TABS
20.0000 mg | ORAL_TABLET | Freq: Every day | ORAL | Status: DC
Start: 2013-08-06 — End: 2013-08-07
  Administered 2013-08-06: 20 mg via ORAL
  Filled 2013-08-06: qty 1

## 2013-08-06 MED ORDER — ONDANSETRON HCL 4 MG/2ML IJ SOLN: 4.0000 mg | Freq: Four times a day (QID) | INTRAMUSCULAR | Status: DC | PRN

## 2013-08-06 MED ORDER — DEXTROSE 50 % IV SOLN
INTRAVENOUS | Status: AC
  Filled 2013-08-06: qty 50

## 2013-08-06 MED ORDER — LIDOCAINE IN DEXTROSE 5-7.5 % IV SOLN
INTRAVENOUS | Status: DC | PRN
Start: 2013-08-06 — End: 2013-08-06
  Administered 2013-08-06: 100 mg via INTRATHECAL

## 2013-08-06 MED ORDER — ONDANSETRON HCL 4 MG PO TABS
4.0000 mg | ORAL_TABLET | Freq: Four times a day (QID) | ORAL | Status: DC | PRN
Start: 2013-08-06 — End: 2013-08-07

## 2013-08-06 MED ORDER — FOSINOPRIL SODIUM 20 MG PO TABS: 20.0000 mg | ORAL_TABLET | Freq: Every day | ORAL | Status: DC

## 2013-08-06 MED ORDER — FENTANYL CITRATE 0.05 MG/ML IJ SOLN
25.0000 ug | INTRAMUSCULAR | Status: DC
  Administered 2013-08-06: 25 ug via INTRAVENOUS

## 2013-08-06 MED ORDER — PNEUMOCOCCAL VAC POLYVALENT 25 MCG/0.5ML IJ INJ
0.5000 mL | INJECTION | INTRAMUSCULAR | Status: AC
  Administered 2013-08-07: 0.5 mL via INTRAMUSCULAR
  Filled 2013-08-06: qty 0.5

## 2013-08-06 MED ORDER — FENTANYL CITRATE 0.05 MG/ML IJ SOLN: 25.0000 ug | INTRAMUSCULAR | Status: DC | PRN

## 2013-08-06 MED ORDER — PROPOFOL 10 MG/ML IV BOLUS
INTRAVENOUS | Status: AC
  Filled 2013-08-06: qty 20

## 2013-08-06 MED ORDER — SODIUM CHLORIDE 0.9 % IV SOLN
INTRAVENOUS | Status: DC
  Administered 2013-08-06: 12:00:00 via INTRAVENOUS

## 2013-08-06 MED ORDER — DEXTROSE 50 % IV SOLN
25.0000 mL | Freq: Once | INTRAVENOUS | Status: AC
  Administered 2013-08-06: 25 mL via INTRAVENOUS

## 2013-08-06 MED ORDER — STERILE WATER FOR IRRIGATION IR SOLN
Status: DC | PRN
  Administered 2013-08-06 (×2): 1000 mL

## 2013-08-06 MED ORDER — FENTANYL CITRATE 0.05 MG/ML IJ SOLN
INTRAMUSCULAR | Status: DC | PRN
  Administered 2013-08-06: 25 ug via INTRATHECAL

## 2013-08-06 MED ORDER — CEFAZOLIN SODIUM-DEXTROSE 2-3 GM-% IV SOLR
2.0000 g | Freq: Once | INTRAVENOUS | Status: AC
  Administered 2013-08-06: 2 g via INTRAVENOUS

## 2013-08-06 MED ORDER — METFORMIN HCL 500 MG PO TABS
1000.0000 mg | ORAL_TABLET | Freq: Two times a day (BID) | ORAL | Status: DC
  Administered 2013-08-06 – 2013-08-07 (×2): 1000 mg via ORAL
  Filled 2013-08-06 (×2): qty 2

## 2013-08-06 MED ORDER — MORPHINE SULFATE 2 MG/ML IJ SOLN
1.0000 mg | INTRAMUSCULAR | Status: DC | PRN
  Administered 2013-08-06 – 2013-08-07 (×5): 1 mg via INTRAVENOUS
  Filled 2013-08-06 (×5): qty 1

## 2013-08-06 MED ORDER — INSULIN ASPART 100 UNIT/ML ~~LOC~~ SOLN: 0.0000 [IU] | Freq: Every day | SUBCUTANEOUS | Status: DC

## 2013-08-06 MED ORDER — CEFAZOLIN SODIUM-DEXTROSE 2-3 GM-% IV SOLR
INTRAVENOUS | Status: AC
  Filled 2013-08-06: qty 50

## 2013-08-06 MED ORDER — LISINOPRIL 10 MG PO TABS
20.0000 mg | ORAL_TABLET | Freq: Every day | ORAL | Status: DC
  Administered 2013-08-07: 20 mg via ORAL
  Filled 2013-08-06: qty 2

## 2013-08-06 MED ORDER — LIDOCAINE HCL (CARDIAC) 10 MG/ML IV SOLN
INTRAVENOUS | Status: DC | PRN
  Administered 2013-08-06: 10 mg via INTRAVENOUS

## 2013-08-06 MED ORDER — DOCUSATE SODIUM 100 MG PO CAPS
100.0000 mg | ORAL_CAPSULE | Freq: Every day | ORAL | Status: DC
  Administered 2013-08-06 – 2013-08-07 (×2): 100 mg via ORAL
  Filled 2013-08-06 (×2): qty 1

## 2013-08-06 MED ORDER — ONDANSETRON HCL 4 MG/2ML IJ SOLN: 4.0000 mg | Freq: Once | INTRAMUSCULAR | Status: AC | PRN

## 2013-08-06 MED ORDER — SODIUM CHLORIDE 0.9 % IR SOLN
Status: DC | PRN
  Administered 2013-08-06: 1000 mL

## 2013-08-06 SURGICAL SUPPLY — 40 items
ATTRACTOMAT 16X20 MAGNETIC DRP (DRAPES) ×3 IMPLANT
BAG HAMPER (MISCELLANEOUS) ×3 IMPLANT
CLEANER TIP ELECTROSURG 2X2 (MISCELLANEOUS) ×2 IMPLANT
CLOTH BEACON ORANGE TIMEOUT ST (SAFETY) ×3 IMPLANT
COVER LIGHT HANDLE STERIS (MISCELLANEOUS) ×6 IMPLANT
DECANTER SPIKE VIAL GLASS SM (MISCELLANEOUS) ×3 IMPLANT
DRAIN PENROSE 12X.25 LTX STRL (MISCELLANEOUS) ×3 IMPLANT
DRAIN PENROSE 18X1/2 LTX STRL (DRAIN) IMPLANT
DRSG TEGADERM 4X4.75 (GAUZE/BANDAGES/DRESSINGS) ×2 IMPLANT
ELECT REM PT RETURN 9FT ADLT (ELECTROSURGICAL) ×3
ELECTRODE REM PT RTRN 9FT ADLT (ELECTROSURGICAL) ×1 IMPLANT
FORMALIN 10 PREFIL 120ML (MISCELLANEOUS) ×3 IMPLANT
GLOVE SKINSENSE NS SZ7.0 (GLOVE) ×2
GLOVE SKINSENSE STRL SZ7.0 (GLOVE) ×1 IMPLANT
GOWN STRL REUS W/TWL LRG LVL3 (GOWN DISPOSABLE) ×9 IMPLANT
INST SET MINOR GENERAL (KITS) ×3 IMPLANT
KIT ROOM TURNOVER APOR (KITS) ×3 IMPLANT
MANIFOLD NEPTUNE II (INSTRUMENTS) ×3 IMPLANT
NDL HYPO 25X1 1.5 SAFETY (NEEDLE) ×1 IMPLANT
NEEDLE HYPO 25X1 1.5 SAFETY (NEEDLE) ×3 IMPLANT
NS IRRIG 1000ML POUR BTL (IV SOLUTION) ×3 IMPLANT
PACK MINOR (CUSTOM PROCEDURE TRAY) ×3 IMPLANT
PAD ARMBOARD 7.5X6 YLW CONV (MISCELLANEOUS) ×3 IMPLANT
SET BASIN LINEN APH (SET/KITS/TRAYS/PACK) ×3 IMPLANT
SOL PREP PROV IODINE SCRUB 4OZ (MISCELLANEOUS) ×3 IMPLANT
SPONGE GAUZE 4X4 12PLY (GAUZE/BANDAGES/DRESSINGS) ×3 IMPLANT
SPONGE INTESTINAL PEANUT (DISPOSABLE) ×3 IMPLANT
SPONGE LAP 18X18 X RAY DECT (DISPOSABLE) ×3 IMPLANT
STAPLER VISISTAT 35W (STAPLE) ×3 IMPLANT
SUT NUROLON NAB CT 2 2-0 18IN (SUTURE) ×2 IMPLANT
SUT SILK 2 0 (SUTURE) ×3
SUT SILK 2-0 18XBRD TIE 12 (SUTURE) ×1 IMPLANT
SUT VIC AB 3-0 SH 27 (SUTURE) ×3
SUT VIC AB 3-0 SH 27X BRD (SUTURE) ×1 IMPLANT
SUT VICRYL AB 3 0 TIES (SUTURE) ×3 IMPLANT
SYR BULB IRRIGATION 50ML (SYRINGE) ×3 IMPLANT
SYR CONTROL 10ML LL (SYRINGE) ×3 IMPLANT
TRAY FOLEY CATH 16FR SILVER (SET/KITS/TRAYS/PACK) ×3 IMPLANT
WATER STERILE IRR 1000ML POUR (IV SOLUTION) ×6 IMPLANT
YANKAUER SUCT BULB TIP NO VENT (SUCTIONS) ×2 IMPLANT

## 2013-08-06 NOTE — Anesthesia Preprocedure Evaluation (Signed)
Anesthesia Evaluation  Patient identified by MRN, date of birth, ID band Patient awake    Reviewed: Allergy & Precautions, H&P , NPO status , Patient's Chart, lab work & pertinent test results, reviewed documented beta blocker date and time   Airway Mallampati: I TM Distance: >3 FB     Dental  (+) Teeth Intact   Pulmonary neg pulmonary ROS,  breath sounds clear to auscultation        Cardiovascular hypertension, Pt. on home beta blockers and Pt. on medications + CAD Rhythm:Regular Rate:Normal     Neuro/Psych    GI/Hepatic   Endo/Other  diabetes, Type 2, Oral Hypoglycemic Agents  Renal/GU      Musculoskeletal   Abdominal   Peds  Hematology   Anesthesia Other Findings   Reproductive/Obstetrics                           Anesthesia Physical Anesthesia Plan  ASA: III  Anesthesia Plan: Spinal and MAC   Post-op Pain Management:    Induction: Intravenous  Airway Management Planned: Nasal Cannula  Additional Equipment:   Intra-op Plan:   Post-operative Plan:   Informed Consent: I have reviewed the patients History and Physical, chart, labs and discussed the procedure including the risks, benefits and alternatives for the proposed anesthesia with the patient or authorized representative who has indicated his/her understanding and acceptance.     Plan Discussed with:   Anesthesia Plan Comments:         Anesthesia Quick Evaluation

## 2013-08-06 NOTE — Brief Op Note (Signed)
08/06/2013  9:24 AM  PATIENT:  [name] [name]  68 y.o. male  PRE-OPERATIVE DIAGNOSIS:  left inguinal hernia  POST-OPERATIVE DIAGNOSIS:  left inguinal hernia  PROCEDURE:  Procedure(s): HERNIA REPAIR INGUINAL ADULT (Left)  SURGEON:  Surgeon(s) and Role:    * Marlane HatcherWilliam S Pierra Skora, MD - Primary  PHYSICIAN ASSISTANT:   ASSISTANTS: none   ANESTHESIA:   spinal  EBL:  Total I/O In: 900 [I.V.:900] Out: 210 [Urine:200; Blood:10]  BLOOD ADMINISTERED:none  DRAINS: none   LOCAL MEDICATIONS USED:  MARCAINE 0.5% ~ 10 cc.  SPECIMEN:  Source of Specimen:  left inguinal hernia sac.  DISPOSITION OF SPECIMEN:  PATHOLOGY  COUNTS:  YES  TOURNIQUET:  * No tourniquets in log *  DICTATION: .Other Dictation: Dictation Number OR dict. # [id number].  PLAN OF CARE: Admit for overnight observation  PATIENT DISPOSITION:  PACU - hemodynamically stable.   Delay start of Pharmacological VTE agent (>24hrs) due to surgical blood loss or risk of bleeding: not applicable

## 2013-08-06 NOTE — Progress Notes (Signed)
Post OP Check  Filed Vitals:   08/06/13 1317  BP: 134/69  Pulse: 55  Temp: 97.7 F (36.5 C)  Resp: 18   Wound clean and dry.  Pt has no problems from spinal.   Will D/C foley when ambulating.  BS and pain under conrol with present regimen.

## 2013-08-06 NOTE — Transfer of Care (Addendum)
Immediate Anesthesia Transfer of Care Note  Patient: [name] [name]  Procedure(s) Performed: Procedure(s) (LRB): HERNIA REPAIR INGUINAL ADULT (Left)  Patient Location: PACU  Anesthesia Type: SAB  Level of Consciousness: awake  Airway & Oxygen Therapy: Patient Spontanous Breathing and nasal cannula  Post-op Assessment: Report given to PACU RN, Post -op Vital signs reviewed and stable. SAB Level  T 10  Post vital signs: Reviewed and stable  Complications: No apparent anesthesia complications

## 2013-08-06 NOTE — ED Provider Notes (Signed)
[age] yr. Old male for elective repair of Left inguinal hernia.  Procedure and risks explained and informed consent obtained.  Labs reviewed and surgical site marked.  Pt given glucose because he fort and took his metformin. Otherwise no clinical change since H&P, dict. # [id number].

## 2013-08-06 NOTE — Anesthesia Procedure Notes (Addendum)
Procedure Name: MAC Date/Time: 08/06/2013 7:35 AM Performed by: Franco NonesYATES, Jarquavious Fentress S Pre-anesthesia Checklist: Patient identified, Emergency Drugs available, Suction available, Timeout performed and Patient being monitored Patient Re-evaluated:Patient Re-evaluated prior to inductionOxygen Delivery Method: Nasal Cannula    Procedure Name: MAC Date/Time: 08/06/2013 7:55 AM Performed by: Franco NonesYATES, Sahir Tolson S Pre-anesthesia Checklist: Patient identified, Emergency Drugs available, Suction available, Timeout performed and Patient being monitored Patient Re-evaluated:Patient Re-evaluated prior to inductionOxygen Delivery Method: Non-rebreather mask    Spinal  Patient location during procedure: OR Start time: 08/06/2013 7:40 AM End time: 08/06/2013 7:46 AM Staffing CRNA/Resident: Minerva AreolaYATES, Rondalyn Belford S Preanesthetic Checklist Completed: patient identified, site marked, surgical consent, pre-op evaluation, timeout performed, IV checked, risks and benefits discussed and monitors and equipment checked Spinal Block Patient position: left lateral decubitus Prep: Betadine Patient monitoring: heart rate, cardiac monitor, continuous pulse ox and blood pressure Approach: left paramedian Location: L3-4 Injection technique: single-shot Needle Needle type: Spinocan  Needle gauge: 22 G Needle length: 9 cm Assessment Sensory level: T6 Additional Notes Betadine prep x3 1% lidocaine skinwheal  Clear CSF pre and post injection  ATTEMPTS: 1 TRAY ID: [id number] TRAY EXPIRATION DATE: 2016-01

## 2013-08-06 NOTE — Anesthesia Postprocedure Evaluation (Signed)
Anesthesia Post Note  Patient: [name] [name]  Procedure(s) Performed: Procedure(s) (LRB): HERNIA REPAIR INGUINAL ADULT (Left)  Anesthesia type: Spinal  Patient location: PACU  Post pain: Pain level controlled  Post assessment: Post-op Vital signs reviewed, Patient's Cardiovascular Status Stable, Respiratory Function Stable, Patent Airway, No signs of Nausea or vomiting and Pain level controlled  Last Vitals:  Filed Vitals:   08/06/13 0907  BP: 101/50  Pulse: 56  Temp: 36.4 C  Resp: 15    Post vital signs: Reviewed and stable  Level of consciousness: awake and alert   Complications: No apparent anesthesia complications

## 2013-08-07 ENCOUNTER — Encounter (HOSPITAL_COMMUNITY): Payer: Self-pay | Admitting: General Surgery

## 2013-08-07 LAB — GLUCOSE, CAPILLARY
GLUCOSE-CAPILLARY: 217 mg/dL — AB (ref 70–99)
Glucose-Capillary: 201 mg/dL — ABNORMAL HIGH (ref 70–99)

## 2013-08-07 MED ORDER — DSS 100 MG PO CAPS: 100.0000 mg | ORAL_CAPSULE | Freq: Every day | ORAL | Status: DC

## 2013-08-07 MED ORDER — OXYCODONE-ACETAMINOPHEN 10-325 MG PO TABS: 1.0000 | ORAL_TABLET | ORAL | Status: DC | PRN

## 2013-08-07 NOTE — Progress Notes (Signed)
Inpatient Diabetes Program Recommendations  AACE/ADA: New Consensus Statement on Inpatient Glycemic Control (2013)  Target Ranges:  Prepandial:   less than 140 mg/dL      Peak postprandial:   less than 180 mg/dL (1-2 hours)      Critically ill patients:  140 - 180 mg/dL   Reason for Visit: CBG's greater than 200 mg/dL. Consider adding Lantus 12 units daily while in the hospital.  Also please check A1C to determine pre-hospitalization glycemic control.    Thanks, Beryl MeagerJenny Charle Mclaurin, RN, BC-ADM Inpatient Diabetes Coordinator Pager [phone]

## 2013-08-07 NOTE — Progress Notes (Signed)
POD #1  Filed Vitals:   08/07/13 0858  BP: 180/75  Pulse: 59  Temp: 98.5 F (36.9 C)  Resp: 18    Wound clean and redressed.  No problems voiding.  Tolerating PO well without nausea.  No leg pain or shortness of breath or chest pain.  Discharge note dictated (# [id number]).  Discharge and follow up arranged.

## 2013-08-07 NOTE — Op Note (Signed)
[name]:  [name], [name] [name]               ACCOUNT NO.:  [id number]  MEDICAL RECORD NO.:  [id number]  LOCATION:  A334                          FACILITY:  [facility]  PHYSICIAN:  Barbaraann BarthelWilliam Tiffony Kite, M.D. DATE OF BIRTH:  [birth year]  DATE OF PROCEDURE:  08/06/2013 DATE OF DISCHARGE:                              OPERATIVE REPORT   SURGEON:  Barbaraann BarthelWilliam Quinette Hentges, M.D.  PREOPERATIVE DIAGNOSIS:  Left inguinal hernia.  POSTOPERATIVE DIAGNOSIS:  Left inguinal hernia.  PROCEDURE:  Left inguinal herniorrhaphy (modified McVay repair without the use of mesh).  NOTE:  This is a 68 year old African American who was referred by Dr. Loleta ChanceHill for repair of left inguinal hernia.  We after discussing the surgery in detail and obtaining medical and cardiac clearance, we admitted him via the outpatient department for elective repair.  Prior to this, we discussed complications not limited to but including bleeding, infection, and recurrence.  Informed consent was obtained.  GROSS OPERATIVE FINDINGS:  An indirect and a direct defect.  SPECIMEN:  Indirect inguinal sac.  WOUND CLASSIFICATION:  Clean.  TECHNIQUE:  The patient was placed in the supine position.  After the adequate administration of spinal anesthesia, a Foley catheter was aseptically inserted.  He was prepped with Betadine solution and draped in usual manner.  An incision was carried out between the anterior- superior iliac spine and the pubic tubercle through skin, subcutaneous tissue, down to the external oblique, which was opened through the external ring.  The cord structures were dissected free from the hernia sac preserving the ilioinguinal nerve, and once this was done, the sac was then doubly ligated with 2-0 Bralon under direct vision.  A large lipoma of the cord was likewise removed as well.  The direct portion of the hernia was repaired suturing transversus abdominis and transversalis fascia to ilioinguinal ligament and Poupart's ligament with  interrupted 2-0 Bralon sutures.  These were cinched tightly after relaxing incision. We then used 0.5% Sensorcaine without epinephrine to help with postoperative comfort.  The wound was irrigated with normal saline solution.  The cord structures were returned to their anatomic position, and the external oblique was repaired over them with a running 3-0 Polysorb suture.  The wound was irrigated with normal saline solution.  The skin was approximated with stapling device.  Prior to closure, all sponge, needle, and instrument counts were found to be correct.  Estimated blood loss was minimal.  The patient received about 800-900 mL crystalloids intraoperatively.  There were no complications.     Barbaraann BarthelWilliam Ledarrius Beauchaine, M.D.     WB/MEDQ  D:  08/06/2013  T:  08/07/2013  Job:  [id number]  cc:   Dr. Loleta ChanceHill

## 2013-08-07 NOTE — Progress Notes (Signed)
IV removed, site WNL.  Pt given d/c instructions and new prescriptions.  Discussed home care with patient and his wife, discussed home medications, patient verbalizes understanding, teachback completed. F/U appointment in place with Dr Malvin JohnsBradford, pt states they will keep appointment. Pt is stable at this time. Pt taken to main entrance in wheelchair by staff member.

## 2013-08-08 NOTE — Discharge Summary (Signed)
NAMWenda Over[place]:  [name] [name], [name] [name]               ACCOUNT NO.:  [id number]  MEDICAL RECORD NO.:  [id number]  LOCATION:  A334                          FACILITY:  [facility]  PHYSICIAN:  Barbaraann BarthelWilliam Kyiah [name], M.D. DATE OF BIRTH:  1945-11-04  DATE OF ADMISSION:  08/06/2013 DATE OF DISCHARGE:  01/16/2015LH                              DISCHARGE SUMMARY   PROCEDURE:  On 08/06/2013, repair of left inguinal hernia (modified McVay repair without mesh).  DIAGNOSIS:  Left inguinal hernia.  SECONDARY DIAGNOSES: 1. Hypertension. 2. Type 2 diabetes mellitus 3. Coronary artery disease.  NOTE:  This is a 68 year old African American who was referred from Dr. Adaline SillHill's office for elective repair of a left inguinal hernia.  He was cleared for surgery by Cardiology team preoperatively, and we admitted him via the outpatient department, at which time, a left inguinal herniorrhaphy was carried out uneventfully.  I kept him in the hospital for a period of observation to make sure that he could void well and that he had no trouble with the spinal anesthesia, and he was discharged on the following postoperative day.  At that time, he was tolerating p.o. well without any nausea.  His wound was clean.  He had no dysuria. No trouble voiding.  No leg pain, shortness of breath, or chest pain. His blood sugars were slightly elevated, but controlled with the sliding scale regimen, and he will be placed back on his oral hypoglycemics, and he is encouraged to follow up with Dr. Loleta ChanceHill regarding his diabetes.  We have discussed postoperative care with him.  He is told to call me should he have any acute problems or go to the emergency room, and we will follow up with him as an outpatient after which he is to return to Dr. Loleta ChanceHill for any medical problems he may have in the future.     Barbaraann BarthelWilliam Jakaila Norment, M.D.     WB/MEDQ  D:  08/07/2013  T:  08/08/2013  Job:  [id number]  cc:   Loleta ChanceHill, Dr.

## 2014-03-09 ENCOUNTER — Ambulatory Visit (INDEPENDENT_AMBULATORY_CARE_PROVIDER_SITE_OTHER): Payer: Medicare Other | Admitting: Internal Medicine

## 2014-03-09 ENCOUNTER — Encounter: Payer: Self-pay | Admitting: Internal Medicine

## 2014-03-09 VITALS — BP 142/80 | HR 55 | Ht 71.0 in | Wt 146.8 lb

## 2014-03-09 DIAGNOSIS — R011 Cardiac murmur, unspecified: Secondary | ICD-10-CM

## 2014-03-09 DIAGNOSIS — E785 Hyperlipidemia, unspecified: Secondary | ICD-10-CM

## 2014-03-09 DIAGNOSIS — I422 Other hypertrophic cardiomyopathy: Secondary | ICD-10-CM

## 2014-03-09 DIAGNOSIS — I10 Essential (primary) hypertension: Secondary | ICD-10-CM

## 2014-03-09 DIAGNOSIS — I2510 Atherosclerotic heart disease of native coronary artery without angina pectoris: Secondary | ICD-10-CM

## 2014-03-09 MED ORDER — ROSUVASTATIN CALCIUM 10 MG PO TABS: 20.0000 mg | ORAL_TABLET | Freq: Every day | ORAL | Status: DC

## 2014-03-09 NOTE — Progress Notes (Signed)
OFFICE NOTE  Chief Complaint:  No complaints  Primary Care Physician: [name] Courier, MD  HPI:  [name] [name] a 68 year old gentleman with a history of coronary artery disease, status post stent to mid LAD in 2009. 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 He also has hypertension, diabetes, and dyslipidemia. All these have been well controlled. 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  He recently underwent hernia repair surgery which was successful. He says has been doing well without any difficulty. He denies any chest pain or worsening shortness of breath. He does have a loud systolic murmur which was previously noted and does have a history of mild asymmetric septal hypertrophy on echo in 2011. He recent had lab work through his primary care provider and reported that the cholesterol and blood glucose are within normal limits. We are awaiting those results.  PMHx:  Past Medical History  Diagnosis Date  . Diabetes mellitus   . Coronary artery disease   . Hypertension   . Dyslipidemia   . History of nuclear stress test 01/12/2010    low risk scan; ekg negative for ischemia    Past Surgical History  Procedure Laterality Date  . Coronary stent placement  06/03/2008    Promus stent to mid LAD; 10/23/2007 Vision stent to mid LAD  . Back surgery  1992  . Femoral endarterectomy  2010    right, with bovine patch angioplasty;  post cath, r./t aneursym/claudication  . Transthoracic echocardiogram  01/12/2010    EF>55%; mild concentric LVH; mild MR, mild TR  . Inguinal hernia repair Left 08/06/2013    Procedure: HERNIA REPAIR INGUINAL ADULT;  Surgeon: [name] Hatcher, MD;  Location: [facility];  Service: General;  Laterality: Left;    FAMHx:  Family History  Problem Relation Age of Onset    . Cancer Brother   . Kidney disease Mother   . Heart Problems Mother     SOCHx:   reports that he has never smoked. He has never used smokeless tobacco. He reports that he does not drink alcohol or use illicit drugs.  ALLERGIES:  No Known Allergies  ROS: A comprehensive review of systems was negative.  HOME MEDS: Current Outpatient Prescriptions  Medication Sig Dispense Refill  . Alogliptin-Pioglitazone (OSENI) 25-15 MG TABS Take 1 tablet by mouth daily.      Marland Kitchen aspirin EC 81 MG tablet Take 81 mg by mouth daily.        . carvedilol (COREG) 3.125 MG tablet Take 3.125 mg by mouth 2 (two) times daily with a meal.        . docusate sodium 100 MG CAPS Take 100 mg by mouth daily.  10 capsule  0  . fosinopril (MONOPRIL) 40 MG tablet Take 20 mg by mouth daily.       . metFORMIN (GLUCOPHAGE) 1000 MG tablet Take 1,000 mg by mouth 2 (two) times daily with a meal.        . oxyCODONE-acetaminophen (PERCOCET) 10-325 MG per tablet Take 1 tablet by mouth every 4 (four) hours as needed for pain.  30 tablet  0  . rosuvastatin (CRESTOR) 20 MG tablet Take 1 tablet (20 mg total) by mouth daily.  28 tablet  0   No current facility-administered medications for this visit.    LABS/IMAGING: No results found for  this or any previous visit (from the past 48 hour(s)). No results found.  VITALS: BP 142/80  Pulse 55  Ht 5\' 11"  (1.803 m)  Wt 146 lb 12.8 oz (66.588 kg)  BMI 20.48 kg/m2  EXAM: General appearance: alert and no distress Neck: no adenopathy, no carotid bruit, no JVD, supple, symmetrical, trachea midline and thyroid not enlarged, symmetric, no tenderness/mass/nodules Lungs: clear to auscultation bilaterally Heart: regular rate and rhythm, S1, S2 normal and systolic murmur: early systolic 3/6, crescendo at 2nd right intercostal space Abdomen: soft, non-tender; bowel sounds normal; no masses,  no organomegaly Extremities: extremities normal, atraumatic, no cyanosis or edema Pulses: 2+ and  symmetric Skin: Skin color, texture, turgor normal. No rashes or lesions Neurologic: Grossly normal  EKG: Sinus bradycardia at 55, lateral T wave inversions (unchanged)  ASSESSMENT: 1. Coronary artery disease status post PCI to the mid LAD in 2009 2. Hypertension 3. Dyslipidemia 4. Diabetes type 2 - recently started on Onglyza 5. Murmur-asymmetric septal hypertrophy  PLAN: 1.   [name] [name] is doing very well from a cardiac standpoint. He denies any chest pain or worsening shortness of breath or new symptomatology. He continues to be active and gets a lot of exercise. He reports as long as he sleeps well he isn't energy the next day. His blood pressure is well controlled. We are retrieving laboratory work from his primary care provider but he reports his cholesterol and diabetes are controlled. He is due for repeat echocardiogram as his last echo was in 2011 and demonstrated moderate asymmetric septal hypertrophy. There is a loud systolic murmur which is crescendo at the right upper shoulder border.  [name] NoseKenneth [name]. Hilty, MD, [employer] Attending Cardiologist [facility]  [name] [name] 03/09/2014, 8:21 AM

## 2014-03-09 NOTE — Patient Instructions (Signed)
Your physician has requested that you have an echocardiogram. Echocardiography is a painless test that uses sound waves to create images of your heart. It provides your doctor with information about the size and shape of your heart and how well your heart's chambers and valves are working. This procedure takes approximately one hour. There are no restrictions for this procedure.  Your physician recommends that you schedule a follow-up appointment in: 1 year  

## 2014-03-18 ENCOUNTER — Ambulatory Visit (HOSPITAL_COMMUNITY): Payer: Medicare Other

## 2014-04-01 ENCOUNTER — Ambulatory Visit (HOSPITAL_COMMUNITY)
Admission: RE | Admit: 2014-04-01 | Discharge: 2014-04-01 | Disposition: A | Discharge status: Home / Self Care | Payer: Medicare Other | Source: Ambulatory Visit | Attending: Cardiovascular Disease | Admitting: Cardiovascular Disease

## 2014-04-01 DIAGNOSIS — E119 Type 2 diabetes mellitus without complications: Secondary | ICD-10-CM | POA: Diagnosis not present

## 2014-04-01 DIAGNOSIS — E785 Hyperlipidemia, unspecified: Secondary | ICD-10-CM | POA: Insufficient documentation

## 2014-04-01 DIAGNOSIS — I421 Obstructive hypertrophic cardiomyopathy: Secondary | ICD-10-CM | POA: Insufficient documentation

## 2014-04-01 DIAGNOSIS — I2510 Atherosclerotic heart disease of native coronary artery without angina pectoris: Secondary | ICD-10-CM | POA: Diagnosis not present

## 2014-04-01 DIAGNOSIS — I379 Nonrheumatic pulmonary valve disorder, unspecified: Secondary | ICD-10-CM | POA: Insufficient documentation

## 2014-04-01 DIAGNOSIS — R011 Cardiac murmur, unspecified: Secondary | ICD-10-CM | POA: Insufficient documentation

## 2014-04-01 DIAGNOSIS — I059 Rheumatic mitral valve disease, unspecified: Secondary | ICD-10-CM

## 2014-04-01 DIAGNOSIS — I079 Rheumatic tricuspid valve disease, unspecified: Secondary | ICD-10-CM | POA: Diagnosis not present

## 2014-04-01 DIAGNOSIS — I422 Other hypertrophic cardiomyopathy: Secondary | ICD-10-CM

## 2014-04-01 NOTE — Progress Notes (Signed)
2D Echo Performed 04/01/2014    Jennelle Pinkstaff, RCS  

## 2014-07-29 ENCOUNTER — Encounter (HOSPITAL_COMMUNITY): Payer: Self-pay | Admitting: *Deleted

## 2014-07-29 ENCOUNTER — Emergency Department (HOSPITAL_COMMUNITY)
Admission: EM | Admit: 2014-07-29 | Discharge: 2014-07-29 | Disposition: A | Discharge status: Home / Self Care | Payer: Medicare Other | Attending: Emergency Medicine | Admitting: Emergency Medicine

## 2014-07-29 ENCOUNTER — Emergency Department (HOSPITAL_COMMUNITY): Payer: Medicare Other

## 2014-07-29 DIAGNOSIS — I10 Essential (primary) hypertension: Secondary | ICD-10-CM | POA: Diagnosis not present

## 2014-07-29 DIAGNOSIS — Z79899 Other long term (current) drug therapy: Secondary | ICD-10-CM | POA: Insufficient documentation

## 2014-07-29 DIAGNOSIS — R011 Cardiac murmur, unspecified: Secondary | ICD-10-CM | POA: Insufficient documentation

## 2014-07-29 DIAGNOSIS — R05 Cough: Secondary | ICD-10-CM

## 2014-07-29 DIAGNOSIS — E119 Type 2 diabetes mellitus without complications: Secondary | ICD-10-CM | POA: Diagnosis not present

## 2014-07-29 DIAGNOSIS — R55 Syncope and collapse: Secondary | ICD-10-CM | POA: Insufficient documentation

## 2014-07-29 DIAGNOSIS — B9789 Other viral agents as the cause of diseases classified elsewhere: Secondary | ICD-10-CM | POA: Diagnosis not present

## 2014-07-29 DIAGNOSIS — I2510 Atherosclerotic heart disease of native coronary artery without angina pectoris: Secondary | ICD-10-CM | POA: Diagnosis not present

## 2014-07-29 DIAGNOSIS — J069 Acute upper respiratory infection, unspecified: Secondary | ICD-10-CM | POA: Diagnosis not present

## 2014-07-29 DIAGNOSIS — R059 Cough, unspecified: Secondary | ICD-10-CM

## 2014-07-29 DIAGNOSIS — R404 Transient alteration of awareness: Secondary | ICD-10-CM | POA: Diagnosis not present

## 2014-07-29 DIAGNOSIS — R531 Weakness: Secondary | ICD-10-CM | POA: Diagnosis not present

## 2014-07-29 LAB — COMPREHENSIVE METABOLIC PANEL
ALK PHOS: 73 U/L (ref 39–117)
ALT: 16 U/L (ref 0–53)
AST: 23 U/L (ref 0–37)
Albumin: 3.6 g/dL (ref 3.5–5.2)
Anion gap: 10 (ref 5–15)
BILIRUBIN TOTAL: 0.8 mg/dL (ref 0.3–1.2)
BUN: 10 mg/dL (ref 6–23)
CALCIUM: 9.1 mg/dL (ref 8.4–10.5)
CO2: 24 mmol/L (ref 19–32)
Chloride: 104 mEq/L (ref 96–112)
Creatinine, Ser: 1.03 mg/dL (ref 0.50–1.35)
GFR, EST AFRICAN AMERICAN: 84 mL/min — AB (ref >90)
GFR, EST NON AFRICAN AMERICAN: 72 mL/min — AB (ref >90)
GLUCOSE: 244 mg/dL — AB (ref 70–99)
Potassium: 5.2 mmol/L — ABNORMAL HIGH (ref 3.5–5.1)
SODIUM: 138 mmol/L (ref 135–145)
TOTAL PROTEIN: 6.4 g/dL (ref 6.0–8.3)

## 2014-07-29 LAB — CBC WITH DIFFERENTIAL/PLATELET
Basophils Absolute: 0 10*3/uL (ref 0.0–0.1)
Basophils Relative: 0 % (ref 0–1)
EOS ABS: 0.2 10*3/uL (ref 0.0–0.7)
EOS PCT: 2 % (ref 0–5)
HCT: 38.8 % — ABNORMAL LOW (ref 39.0–52.0)
Hemoglobin: 13 g/dL (ref 13.0–17.0)
Lymphocytes Relative: 15 % (ref 12–46)
Lymphs Abs: 1.3 10*3/uL (ref 0.7–4.0)
MCH: 29.8 pg (ref 26.0–34.0)
MCHC: 33.5 g/dL (ref 30.0–36.0)
MCV: 89 fL (ref 78.0–100.0)
Monocytes Absolute: 0.6 10*3/uL (ref 0.1–1.0)
Monocytes Relative: 7 % (ref 3–12)
Neutro Abs: 6.4 10*3/uL (ref 1.7–7.7)
Neutrophils Relative %: 76 % (ref 43–77)
Platelets: 175 10*3/uL (ref 150–400)
RBC: 4.36 MIL/uL (ref 4.22–5.81)
RDW: 12.7 % (ref 11.5–15.5)
WBC: 8.5 10*3/uL (ref 4.0–10.5)

## 2014-07-29 LAB — I-STAT TROPONIN, ED
TROPONIN I, POC: 0 ng/mL (ref 0.00–0.08)
TROPONIN I, POC: 0 ng/mL (ref 0.00–0.08)

## 2014-07-29 MED ORDER — SODIUM CHLORIDE 0.9 % IV BOLUS (SEPSIS)
1000.0000 mL | Freq: Once | INTRAVENOUS | Status: AC
  Administered 2014-07-29: 1000 mL via INTRAVENOUS

## 2014-07-29 MED ORDER — INSULIN ASPART 100 UNIT/ML ~~LOC~~ SOLN
5.0000 [IU] | Freq: Once | SUBCUTANEOUS | Status: AC
  Administered 2014-07-29: 5 [IU] via SUBCUTANEOUS
  Filled 2014-07-29: qty 1

## 2014-07-29 NOTE — ED Notes (Signed)
Per [facility] pt had a near syncope episode and nearly passout.  He was in the bathroom and was able to lower himself to the floor.  Pt stated that he felt weak and sweaty.  This was a sudden onset.  Wife decide to call EMS to bring him to hospital for further evaluation.   REMS stated that he was lethargic and diapharetic.  Vitals are as follows: B/P: 132/80 laying down, B/P: 88/60 Sitting down, HR: 54, O2 sat 98%, CBG: 172.

## 2014-07-29 NOTE — ED Notes (Signed)
Pt transported to radiology.

## 2014-07-29 NOTE — ED Provider Notes (Signed)
CSN: [id number]     Arrival date & time 07/29/14  1457 History   First MD Initiated Contact with Patient 07/29/14 1512     Chief Complaint  Patient presents with  . Near Syncope     (Consider location/radiation/quality/duration/timing/severity/associated sxs/prior Treatment) Patient is a 69 y.o. male presenting with dizziness.  Dizziness Quality:  Lightheadedness Severity:  Moderate Onset quality:  Gradual Duration: 30 min. Timing:  Constant Progression:  Resolved Chronicity:  New (had one episode a year ago) Defecating: felt like going to have a BM but didn't.   Relieved by: IVF with EMS. Worsened by:  Nothing tried Ineffective treatments:  None tried Associated symptoms: no blood in stool, no chest pain, no diarrhea, no headaches, no nausea, no shortness of breath, no syncope, no vomiting and no weakness   Risk factors: new medications     Past Medical History  Diagnosis Date  . Diabetes mellitus   . Coronary artery disease   . Hypertension   . Dyslipidemia   . History of nuclear stress test 01/12/2010    low risk scan; ekg negative for ischemia   Past Surgical History  Procedure Laterality Date  . Coronary stent placement  06/03/2008    Promus stent to mid LAD; 10/23/2007 Vision stent to mid LAD  . Back surgery  1992  . Femoral endarterectomy  2010    right, with bovine patch angioplasty;  post cath, r./t aneursym/claudication  . Transthoracic echocardiogram  01/12/2010    EF>55%; mild concentric LVH; mild MR, mild TR  . Inguinal hernia repair Left 08/06/2013    Procedure: HERNIA REPAIR INGUINAL ADULT;  Surgeon: Marlane HatcherWilliam S Bradford, MD;  Location: [facility];  Service: General;  Laterality: Left;   Family History  Problem Relation Age of Onset  . Cancer Brother   . Kidney disease Mother   . Heart Problems Mother    History  Substance Use Topics  . Smoking status: Never Smoker   . Smokeless tobacco: Never Used  . Alcohol Use: No    Review of Systems   Constitutional: Positive for diaphoresis. Negative for fever, activity change and appetite change.  HENT: Positive for congestion and rhinorrhea. Negative for sore throat.   Eyes: Negative for visual disturbance.  Respiratory: Positive for cough. Negative for shortness of breath.   Cardiovascular: Negative for chest pain and syncope.  Gastrointestinal: Negative for nausea, vomiting, abdominal pain, diarrhea and blood in stool.  Genitourinary: Negative for difficulty urinating.  Musculoskeletal: Negative for back pain and neck stiffness.  Skin: Negative for rash.  Neurological: Positive for dizziness and light-headedness. Negative for syncope, speech difficulty, weakness and headaches.      Allergies  Review of patient's allergies indicates no known allergies.  Home Medications   Prior to Admission medications   Medication Sig Start Date End Date Taking? Authorizing Provider  Alogliptin-Pioglitazone (OSENI) 25-15 MG TABS Take 1 tablet by mouth daily.    Historical Provider, MD  aspirin EC 81 MG tablet Take 81 mg by mouth daily.      Historical Provider, MD  carvedilol (COREG) 3.125 MG tablet Take 3.125 mg by mouth 2 (two) times daily with a meal.      Historical Provider, MD  docusate sodium 100 MG CAPS Take 100 mg by mouth daily. 08/07/13   Marlane HatcherWilliam S Bradford, MD  fosinopril (MONOPRIL) 40 MG tablet Take 20 mg by mouth daily.     Historical Provider, MD  metFORMIN (GLUCOPHAGE) 1000 MG tablet Take 1,000 mg by mouth 2 (  two) times daily with a meal.      Historical Provider, MD  oxyCODONE-acetaminophen (PERCOCET) 10-325 MG per tablet Take 1 tablet by mouth every 4 (four) hours as needed for pain. 08/07/13   [name], MD  rosuvastatin (CRESTOR) 10 MG tablet Take 2 tablets (20 mg total) by mouth daily. 03/09/14   [name], MD  rosuvastatin (CRESTOR) 20 MG tablet Take 1 tablet (20 mg total) by mouth daily. 02/26/13   [name], MD   BP 138/63 mmHg  Pulse 61  Temp(Src)  98.4 F (36.9 C) (Oral)  Resp 22  SpO2 100% Physical Exam  Constitutional: He is oriented to person, place, and time. He appears well-developed and well-nourished. No distress.  HENT:  Head: Normocephalic and atraumatic.  Eyes: Conjunctivae and EOM are normal.  Neck: Normal range of motion.  Cardiovascular: Normal rate, regular rhythm and intact distal pulses.  Exam reveals no gallop and no friction rub.   Murmur heard. Pulmonary/Chest: Effort normal and breath sounds normal. No respiratory distress. He has no wheezes. He has no rales.  Abdominal: Soft. He exhibits no distension. There is no tenderness. There is no guarding.  Musculoskeletal: He exhibits no edema.  Neurological: He is alert and oriented to person, place, and time.  Skin: Skin is warm and dry. He is not diaphoretic.  Nursing note and vitals reviewed.   ED Course  Procedures (including critical care time) Labs Review Labs Reviewed  CBC WITH DIFFERENTIAL  COMPREHENSIVE METABOLIC PANEL  I-STAT TROPOININ, ED    Imaging Review No results found.   EKG Interpretation   Date/Time:  Thursday July 29 2014 15:02:58 EST Ventricular Rate:  56 PR Interval:  154 QRS Duration: 92 QT Interval:  429 QTC Calculation: 414 R Axis:   -9 Text Interpretation:  Sinus rhythm Consider right atrial enlargement Abnrm  T, consider ischemia, anterolateral lds, similar Jul 28, 2011 Confirmed by  Lincoln Brigham [phone]) on 07/29/2014 3:24:55 PM      MDM   Final diagnoses:  Cough   69 year old male with a history of coronary artery disease status post stenting in 2009, asymmetric septal hypertrophy, dyslipidemia, diabetes presents with concern for lightheadedness and near-syncope. Patient walked to the bathroom and felt lightheadedness and lowered himself to the floor.  No trauma/head injury.  Low suspicion for ACS or PE given no chest pain or shortness of breath. Two hour delta troponins were checked which were negative.  CMP showed  mild hyperkalemia of 5.2 and hyperglycemia of 244. Patient received IV fluids and  insulin.  EKG was evaluated by me and showed normal sinus rhythm without signs of significant prolonged QTC, Brugada, delta waves and showed abnormal T waves that have been present on prior EKG.  CXR was done given history of cough and showed no sign of pneumonia.  Discussed patient history with cardiology given history of septal hypertrophy and they will follow up with him as an outpatient.  Possible vasovagal or dehydration induced lightheadedness in setting of viral illness.  Patient discharged in stable condition with understanding of reasons to return.   Rhae Lerner, MD 07/30/14 6045  Tilden Fossa, MD 08/02/14 [phone]

## 2014-07-29 NOTE — ED Notes (Signed)
NAD at this time. Pt is stable and leaving with his wife and granddaughter.

## 2014-07-30 ENCOUNTER — Telehealth: Payer: Self-pay | Admitting: Internal Medicine

## 2014-08-02 NOTE — Telephone Encounter (Signed)
Closed encounter °

## 2014-08-05 ENCOUNTER — Ambulatory Visit (INDEPENDENT_AMBULATORY_CARE_PROVIDER_SITE_OTHER): Payer: Medicare Other | Admitting: Nurse Practitioner

## 2014-08-05 ENCOUNTER — Encounter: Payer: Self-pay | Admitting: Nurse Practitioner

## 2014-08-05 VITALS — BP 142/82 | HR 57 | Ht 71.0 in | Wt 155.2 lb

## 2014-08-05 DIAGNOSIS — R55 Syncope and collapse: Secondary | ICD-10-CM

## 2014-08-05 DIAGNOSIS — E785 Hyperlipidemia, unspecified: Secondary | ICD-10-CM

## 2014-08-05 DIAGNOSIS — I10 Essential (primary) hypertension: Secondary | ICD-10-CM | POA: Insufficient documentation

## 2014-08-05 DIAGNOSIS — I2510 Atherosclerotic heart disease of native coronary artery without angina pectoris: Secondary | ICD-10-CM

## 2014-08-05 DIAGNOSIS — E119 Type 2 diabetes mellitus without complications: Secondary | ICD-10-CM | POA: Insufficient documentation

## 2014-08-05 DIAGNOSIS — I422 Other hypertrophic cardiomyopathy: Secondary | ICD-10-CM

## 2014-08-05 NOTE — Progress Notes (Signed)
Cardiology Office Note   Date:  08/05/2014   ID:  [name] [name], DOB 01/31/1946, MRN [id number]  PCP:  Evlyn Courier, MD  Cardiologist:  C. Hilty, MD    Chief Complaint  Patient presents with  . Follow-up r/t recent episode of presyncope         History of Present Illness: [name] [name] is a 69 y.o. male who presents today for cardiology evaluation.   He has a h/o hypertrophic CM and CAD s/p LAD stenting in 2009.  He was last seen in clinic in 02/2014, @ which time he was doing well.  F/U echo was performed in September and showed nl LV fxn with severe septal basal hypertrophy w/o SAM but with near mid-cavitary obstruction noted.  He had been doing well @ home but last week he awoke to use the bathroom and upon entering the bathroom, felt lightheaded like he might pass out.  He eased himself to the floor w/o losing consciousness.  He called to his wife and she called EMS.  He continued to feel poorly w/o syncope, chest pain, or dyspnea, and EMS took him to the [facility] ED.  There, labs were wnl and ECG was unrevealing.  He was d/c'd and advised to f/u with [place].  Since ED visit, he has had no recurrence of presyncope and has begun to resume his usual activities.  Today, he denies symptoms of palpitations, chest pain, shortness of breath, orthopnea, PND, lower extremity edema, claudication, dizziness, presyncope, syncope, bleeding, or neurologic sequela. The patient is tolerating medications without difficulties and is otherwise without complaint today.   Past Medical History  Diagnosis Date  . Diabetes mellitus   . Coronary artery disease     a. 10/2007 s/p PCI of LAD (3.5x23 Vision BMS); b. 05/2008 Cath/PCI: LM nl, LAD 25m, 80 ISR (3.5x28 Promus DES), LCX min irregs, RI nl;  c. 12/2009 MV: EF 61%, no ischemia.  . Hypertension   . Dyslipidemia   . Hypertrophic cardiomyopathy     a. 03/2014 Echo: EF 60-65%, Gr 1 DD, sev septal basal hypertrophy, no SAM but near mid-cavitary obstruction, mild MR.      Current Outpatient Prescriptions  Medication Sig Dispense Refill  . aspirin EC 81 MG tablet Take 81 mg by mouth daily.      . carvedilol (COREG) 3.125 MG tablet Take 3.125 mg by mouth 2 (two) times daily with a meal.      . docusate sodium 100 MG CAPS Take 100 mg by mouth daily. (Patient taking differently: Take 100 mg by mouth as needed. ) 10 capsule 0  . FLUZONE HIGH-DOSE 0.5 ML SUSY   0  . fosinopril (MONOPRIL) 40 MG tablet Take 20 mg by mouth daily.     Marland Kitchen glipiZIDE (GLUCOTROL) 10 MG tablet Take 10 mg by mouth 2 (two) times daily.     . metFORMIN (GLUCOPHAGE) 1000 MG tablet Take 1,000 mg by mouth 2 (two) times daily with a meal.      . Multiple Vitamins-Minerals (MULTIVITAMIN PO) Take by mouth daily.    . rosuvastatin (CRESTOR) 10 MG tablet Take 2 tablets (20 mg total) by mouth daily. 28 tablet 0   Allergies:   Review of patient's allergies indicates no known allergies.   ROS:  Please see the history of present illness.   Positive for recent episode of presyncope as outlined above.  All other systems are reviewed and negative.    PHYSICAL EXAM: VS:  BP 142/82 mmHg  Pulse  57  Ht 5\' 11"  (1.803 m)  Wt 155 lb 3.2 oz (70.398 kg)  BMI 21.66 kg/m2 , BMI Body mass index is 21.66 kg/(m^2).  BP lying 160/90, BP Sitting 155/90, BP Standing 155/90.  GEN: Well nourished, well developed, in no acute distress HEENT: normal Neck: no JVD, carotid bruits, or masses Cardiac: RRR; 3/6 SEM throughout, no rubs, or gallops,no edema  Respiratory:  clear to auscultation bilaterally, normal work of breathing GI: soft, nontender, nondistended, + BS MS: no deformity or atrophy Skin: warm and dry, no rash Neuro:  Strength and sensation are intact Psych: euthymic mood, full affect  EKG:  The ekg ordered today shows SB, 57, TWI V4-5 - no acute st/t changes.  Recent Labs: 07/29/2014: ALT 16; BUN 10; Creatinine 1.03; Hemoglobin 13.0; Platelets 175; Potassium 5.2*; Sodium 138  No results found for  requested labs within last 365 days.   Estimated Creatinine Clearance: 67.4 mL/min (by C-G formula based on Cr of 1.03).   Wt Readings from Last 3 Encounters:  08/05/14 155 lb 3.2 oz (70.398 kg)  03/09/14 146 lb 12.8 oz (66.588 kg)  07/29/13 152 lb 4.8 oz (69.083 kg)    ASSESSMENT AND PLAN:  1.  Presyncope:  Pt had a presyncopal episode last week after getting up in the early morning hours to use the bathroom.  This occurred prior to using the bathroom.  He never lost consciousness and eased himself to the floor.  He was seen by EMS w/o evidence of arrhythmia with nl HR/BP and was then seen in ED with relatively unrevealing evaluation.  He has had no recurrence of Ss.  He is not orthostatic on exam today.  He denies any recent h/o chest pain, dyspnea, or palpitations.  If he has any recurrence, he will notify us and I would have a low threshold to place a monitor to eval for arrhythmia in setting of hypertrophic CM.  I've recommended that he ensure adequate hydration.  2.  Hypertrophic CM:  Last echo 03/2015.  Volume stable.  ? Role in presyncope.  Cont bb/acei.  3.  HTN:  BP elevated today.  He isn't sure what it runs @ home but thinks it's been better @ other doctors visits. He will use his wife's cuff over the next week and record BP's.  I've asked him to call us in a week with those findings and if his BP is consistently > 140/90, we should add amlodipine 2.5 mg po daily.  4.  HL:  Cont statin.  Followed by PCP.  LFT's nl when seen in ED.  5.  DM:  On metformin per PCP.  6.  CAD:  S/P prior LAD stenting.  No obj evidence of ischemia upon recent ER evaluation.  No recent c/p/dyspnea.  Cont asa, bb, statin.  Labs/ tests ordered today include: none  Disposition:   FU with Dr. Rennis GoldenHilty in 3 mos or sooner if necessary.   Signed, Nicolasa Duckinghristopher Neyla Gauntt, NP  08/05/2014 11:11 AM      [facility] [address] [place] [address]  [phone] (office) [phone]  (fax)

## 2014-08-05 NOTE — Patient Instructions (Signed)
Ward Givenshris Berge, NP recommends that your check you blood pressure daily and keep a recording of your daily blood pressures.  Ward Givenshris Berge, NP recommends that you schedule a follow-up appointment in 6 months with Dr Rennis GoldenHilty. You will receive a reminder letter in the mail two months in advance. If you don't receive a letter, please call our office to schedule the follow-up appointment.

## 2014-09-01 DIAGNOSIS — E119 Type 2 diabetes mellitus without complications: Secondary | ICD-10-CM | POA: Diagnosis not present

## 2014-09-01 DIAGNOSIS — I10 Essential (primary) hypertension: Secondary | ICD-10-CM | POA: Diagnosis not present

## 2014-09-01 DIAGNOSIS — Z125 Encounter for screening for malignant neoplasm of prostate: Secondary | ICD-10-CM | POA: Diagnosis not present

## 2014-11-05 ENCOUNTER — Telehealth: Payer: Self-pay | Admitting: Internal Medicine

## 2014-11-08 NOTE — Telephone Encounter (Signed)
Close encounter 

## 2014-12-06 DIAGNOSIS — E118 Type 2 diabetes mellitus with unspecified complications: Secondary | ICD-10-CM | POA: Diagnosis not present

## 2014-12-06 DIAGNOSIS — E162 Hypoglycemia, unspecified: Secondary | ICD-10-CM | POA: Diagnosis not present

## 2014-12-06 DIAGNOSIS — Z0000 Encounter for general adult medical examination without abnormal findings: Secondary | ICD-10-CM | POA: Diagnosis not present

## 2015-02-01 ENCOUNTER — Ambulatory Visit (INDEPENDENT_AMBULATORY_CARE_PROVIDER_SITE_OTHER): Payer: Medicare Other | Admitting: Internal Medicine

## 2015-02-01 ENCOUNTER — Encounter: Payer: Self-pay | Admitting: Internal Medicine

## 2015-02-01 VITALS — BP 132/78 | HR 60 | Ht 71.5 in | Wt 153.5 lb

## 2015-02-01 DIAGNOSIS — I10 Essential (primary) hypertension: Secondary | ICD-10-CM | POA: Diagnosis not present

## 2015-02-01 DIAGNOSIS — I2583 Coronary atherosclerosis due to lipid rich plaque: Principal | ICD-10-CM

## 2015-02-01 DIAGNOSIS — I2510 Atherosclerotic heart disease of native coronary artery without angina pectoris: Secondary | ICD-10-CM | POA: Diagnosis not present

## 2015-02-01 DIAGNOSIS — E785 Hyperlipidemia, unspecified: Secondary | ICD-10-CM

## 2015-02-01 DIAGNOSIS — I422 Other hypertrophic cardiomyopathy: Secondary | ICD-10-CM

## 2015-02-01 NOTE — Progress Notes (Signed)
OFFICE NOTE  Chief Complaint:  No complaints  Primary Care Physician: Evlyn Courier, MD  HPI:  [name] [name] a 69 year old gentleman with a history of coronary artery disease, status post stent to mid LAD in 2009. 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 He also has hypertension, diabetes, and dyslipidemia. All these have been well controlled. 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  He recently underwent hernia repair surgery which was successful. He says has been doing well without any difficulty. He denies any chest pain or worsening shortness of breath. He does have a loud systolic murmur which was previously noted and does have a history of mild asymmetric septal hypertrophy on echo in 2011. He recent had lab work through his primary care provider and reported that the cholesterol and blood glucose are within normal limits. We are awaiting those results.  I saw [name] [name] back in the office today. Overall he is doing well except for some mild swelling of the lower extremities. Blood pressure is well-controlled today. He's had no syncopal events. He does have a systolic murmur with asymmetric septal hypertrophy without outflow tract obstruction. This was looked at with echo last year and does not seem to worsen significantly. He denies any chest pain associated with his prior coronary artery disease and had a stent to the mid LAD in 2009.  PMHx:  Past Medical History  Diagnosis Date  . Diabetes mellitus   . Coronary artery disease     a. 10/2007 s/p PCI of LAD (3.5x23 Vision BMS); b. 05/2008 Cath/PCI: LM nl, LAD 43m, 80 ISR (3.5x28 Promus DES), LCX min irregs, RI nl;  c. 12/2009 MV: EF 61%, no ischemia.  . Hypertension   . Dyslipidemia   . Hypertrophic cardiomyopathy     a. 03/2014 Echo:  EF 60-65%, Gr 1 DD, sev septal basal hypertrophy, no SAM but near mid-cavitary obstruction, mild MR.    Past Surgical History  Procedure Laterality Date  . Coronary stent placement  06/03/2008    Promus stent to mid LAD; 10/23/2007 Vision stent to mid LAD  . Back surgery  1992  . Femoral endarterectomy  2010    right, with bovine patch angioplasty;  post cath, r./t aneursym/claudication  . Transthoracic echocardiogram  01/12/2010    EF>55%; mild concentric LVH; mild MR, mild TR  . Inguinal hernia repair Left 08/06/2013    Procedure: HERNIA REPAIR INGUINAL ADULT;  Surgeon: [name], MD;  Location: [facility];  Service: General;  Laterality: Left;    FAMHx:  Family History  Problem Relation Age of Onset  . Cancer Brother   . Kidney disease Mother   . Heart Problems Mother     SOCHx:   reports that he has never smoked. He has never used smokeless tobacco. He reports that he does not drink alcohol or use illicit drugs.  ALLERGIES:  No Known Allergies  ROS: A comprehensive review of systems was negative.  HOME MEDS: Current Outpatient Prescriptions  Medication Sig Dispense Refill  . aspirin 325 MG EC tablet Take 81.25 mg by mouth daily.    . carvedilol (COREG) 3.125 MG tablet Take 3.125 mg by mouth 2 (two) times daily with a meal.      . docusate sodium 100 MG CAPS Take 100 mg by mouth daily. (Patient taking differently: Take  100 mg by mouth as needed. ) 10 capsule 0  . fosinopril (MONOPRIL) 40 MG tablet Take 40 mg by mouth daily.     Marland Kitchen. glipiZIDE (GLUCOTROL) 10 MG tablet Take 10 mg by mouth 2 (two) times daily.     . metFORMIN (GLUCOPHAGE) 1000 MG tablet Take 1,000 mg by mouth 2 (two) times daily with a meal.      . Multiple Vitamins-Minerals (MULTIVITAMIN PO) Take by mouth daily.    . rosuvastatin (CRESTOR) 10 MG tablet Take 2 tablets (20 mg total) by mouth daily. 28 tablet 0   No current facility-administered medications for this visit.    LABS/IMAGING: No results  found for this or any previous visit (from the past 48 hour(s)). No results found.  VITALS: BP 132/78 mmHg  Pulse 60  Ht 5' 11.5" (1.816 m)  Wt 153 lb 8 oz (69.627 kg)  BMI 21.11 kg/m2  EXAM: General appearance: alert and no distress Neck: no adenopathy, no carotid bruit, no JVD, supple, symmetrical, trachea midline and thyroid not enlarged, symmetric, no tenderness/mass/nodules Lungs: clear to auscultation bilaterally Heart: regular rate and rhythm, S1, S2 normal and systolic murmur: early systolic 3/6, crescendo at 2nd right intercostal space Abdomen: soft, non-tender; bowel sounds normal; no masses,  no organomegaly Extremities: extremities normal, atraumatic, no cyanosis or edema Pulses: 2+ and symmetric Skin: Skin color, texture, turgor normal. No rashes or lesions Neurologic: Grossly normal  EKG: Normal sinus rhythm at 60, incomplete right bundle branch pattern  ASSESSMENT: 1. Coronary artery disease status post PCI to the mid LAD in 2009 2. Hypertension 3. Dyslipidemia 4. Diabetes type 2 - controlled, followed by PCP 5. Murmur-asymmetric septal hypertrophy  PLAN: 1.   [name] [name] is doing very well from a cardiac standpoint. He denies any chest pain or worsening shortness of breath or new symptomatology. He continues to be active and gets a lot of exercise. He reports as long as he sleeps well he isn't energy the next day. His blood pressure is well controlled. We are retrieving laboratory work from his primary care provider but he reports his cholesterol and diabetes are controlled. Murmur appears stable and was evaluated by echo last year. Overall stable from a cardiac standpoint. Plan to see him back annually or sooner as necessary.  Chrystie NoseKenneth C. Travonte Byard, MD, [facility]ACC Attending Cardiologist [facility]   Lisette AbuKenneth C [facility] 02/01/2015, 11:18 AM

## 2015-02-01 NOTE — Patient Instructions (Signed)
Your physician wants you to follow-up in: 1 year with Dr. Hilty. You will receive a reminder letter in the mail two months in advance. If you don't receive a letter, please call our office to schedule the follow-up appointment.  

## 2015-03-09 DIAGNOSIS — I10 Essential (primary) hypertension: Secondary | ICD-10-CM | POA: Diagnosis not present

## 2015-03-09 DIAGNOSIS — E118 Type 2 diabetes mellitus with unspecified complications: Secondary | ICD-10-CM | POA: Diagnosis not present

## 2015-03-09 DIAGNOSIS — E162 Hypoglycemia, unspecified: Secondary | ICD-10-CM | POA: Diagnosis not present

## 2015-06-08 DIAGNOSIS — Z23 Encounter for immunization: Secondary | ICD-10-CM | POA: Diagnosis not present

## 2015-06-08 DIAGNOSIS — I10 Essential (primary) hypertension: Secondary | ICD-10-CM | POA: Diagnosis not present

## 2015-06-08 DIAGNOSIS — Z6823 Body mass index (BMI) 23.0-23.9, adult: Secondary | ICD-10-CM | POA: Diagnosis not present

## 2015-06-08 DIAGNOSIS — E118 Type 2 diabetes mellitus with unspecified complications: Secondary | ICD-10-CM | POA: Diagnosis not present

## 2015-06-08 DIAGNOSIS — Z6828 Body mass index (BMI) 28.0-28.9, adult: Secondary | ICD-10-CM | POA: Diagnosis not present

## 2015-09-07 DIAGNOSIS — Z0000 Encounter for general adult medical examination without abnormal findings: Secondary | ICD-10-CM | POA: Diagnosis not present

## 2015-09-07 DIAGNOSIS — E118 Type 2 diabetes mellitus with unspecified complications: Secondary | ICD-10-CM | POA: Diagnosis not present

## 2015-09-07 DIAGNOSIS — I10 Essential (primary) hypertension: Secondary | ICD-10-CM | POA: Diagnosis not present

## 2015-12-12 DIAGNOSIS — S30860A Insect bite (nonvenomous) of lower back and pelvis, initial encounter: Secondary | ICD-10-CM | POA: Diagnosis not present

## 2015-12-12 DIAGNOSIS — E1165 Type 2 diabetes mellitus with hyperglycemia: Secondary | ICD-10-CM | POA: Diagnosis not present

## 2016-01-06 DIAGNOSIS — E119 Type 2 diabetes mellitus without complications: Secondary | ICD-10-CM | POA: Diagnosis not present

## 2016-01-09 DIAGNOSIS — E1165 Type 2 diabetes mellitus with hyperglycemia: Secondary | ICD-10-CM | POA: Diagnosis not present

## 2016-01-09 DIAGNOSIS — E1169 Type 2 diabetes mellitus with other specified complication: Secondary | ICD-10-CM | POA: Diagnosis not present

## 2016-02-06 ENCOUNTER — Ambulatory Visit (INDEPENDENT_AMBULATORY_CARE_PROVIDER_SITE_OTHER): Payer: Medicare Other | Admitting: Internal Medicine

## 2016-02-06 ENCOUNTER — Encounter: Payer: Self-pay | Admitting: Internal Medicine

## 2016-02-06 VITALS — BP 154/78 | HR 60 | Ht 71.5 in | Wt 151.0 lb

## 2016-02-06 DIAGNOSIS — I2510 Atherosclerotic heart disease of native coronary artery without angina pectoris: Secondary | ICD-10-CM | POA: Diagnosis not present

## 2016-02-06 DIAGNOSIS — I10 Essential (primary) hypertension: Secondary | ICD-10-CM

## 2016-02-06 DIAGNOSIS — R5383 Other fatigue: Secondary | ICD-10-CM | POA: Diagnosis not present

## 2016-02-06 DIAGNOSIS — R9431 Abnormal electrocardiogram [ECG] [EKG]: Secondary | ICD-10-CM | POA: Diagnosis not present

## 2016-02-06 DIAGNOSIS — I422 Other hypertrophic cardiomyopathy: Secondary | ICD-10-CM

## 2016-02-06 DIAGNOSIS — R531 Weakness: Secondary | ICD-10-CM | POA: Insufficient documentation

## 2016-02-06 NOTE — Patient Instructions (Addendum)
Your physician has requested that you have a lexiscan myoview. For further information please visit [email or web address]. Please follow instruction sheet, as given.  Your physician recommends that you schedule a follow-up appointment with Dr. Hilty after your stress test.   

## 2016-02-06 NOTE — Progress Notes (Signed)
OFFICE NOTE  Chief Complaint:  No complaints  Primary Care Physician: Evlyn Courier, MD  HPI:  [name] [name] a 70 year old gentleman with a history of coronary artery disease, status post stent to mid LAD in 2009. 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 He also has hypertension, diabetes, and dyslipidemia. All these have been well controlled. 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  He recently underwent hernia repair surgery which was successful. He says has been doing well without any difficulty. He denies any chest pain or worsening shortness of breath. He does have a loud systolic murmur which was previously noted and does have a history of mild asymmetric septal hypertrophy on echo in 2011. He recent had lab work through his primary care provider and reported that the cholesterol and blood glucose are within normal limits. We are awaiting those results.  I saw Mr. Arvie back in the office today. Overall he is doing well except for some mild swelling of the lower extremities. Blood pressure is well-controlled today. He's had no syncopal events. He does have a systolic murmur with asymmetric septal hypertrophy without outflow tract obstruction. This was looked at with echo last year and does not seem to worsen significantly. He denies any chest pain associated with his prior coronary artery disease and had a stent to the mid LAD in 2009.  02/06/2016  Mr. Ferrara returns today for follow-up. He continues to work for the [employer]. Recently he's had no worsening shortness of breath or chest discomfort but he does not exercise regularly. When asked about energy level he reports he has had some worsening fatigue over the past 6-12 months but attributes that to the hot weather. He does have a  history of hypertrophic cardiomyopathy with his last echo in 2015. His EKG now shows new anterolateral T-wave inversions. These findings could be related to hypertrophic cardiomyopathy at the apex for ischemia in the LAD territory which he previously had.  PMHx:  Past Medical History  Diagnosis Date  . Diabetes mellitus   . Coronary artery disease     a. 10/2007 s/p PCI of LAD (3.5x23 Vision BMS); b. 05/2008 Cath/PCI: LM nl, LAD 21m, 80 ISR (3.5x28 Promus DES), LCX min irregs, RI nl;  c. 12/2009 MV: EF 61%, no ischemia.  . Hypertension   . Dyslipidemia   . Hypertrophic cardiomyopathy (HCC)     a. 03/2014 Echo: EF 60-65%, Gr 1 DD, sev septal basal hypertrophy, no SAM but near mid-cavitary obstruction, mild MR.    Past Surgical History  Procedure Laterality Date  . Coronary stent placement  06/03/2008    Promus stent to mid LAD; 10/23/2007 Vision stent to mid LAD  . Back surgery  1992  . Femoral endarterectomy  2010    right, with bovine patch angioplasty;  post cath, r./t aneursym/claudication  . Transthoracic echocardiogram  01/12/2010    EF>55%; mild concentric LVH; mild MR, mild TR  . Inguinal hernia repair Left 08/06/2013    Procedure: HERNIA REPAIR INGUINAL ADULT;  Surgeon: [name], MD;  Location: [facility];  Service: General;  Laterality: Left;    FAMHx:  Family History  Problem Relation Age of Onset  . Cancer Brother   . Kidney disease Mother   . Heart Problems Mother     SOCHx:   reports  that he has never smoked. He has never used smokeless tobacco. He reports that he does not drink alcohol or use illicit drugs.  ALLERGIES:  No Known Allergies  ROS: Pertinent items noted in HPI and remainder of comprehensive ROS otherwise negative.  HOME MEDS: Current Outpatient Prescriptions  Medication Sig Dispense Refill  . aspirin 325 MG EC tablet Take 81.25 mg by mouth daily.    . carvedilol (COREG) 3.125 MG tablet Take 3.125 mg by mouth 2 (two) times daily with a meal.       . docusate sodium 100 MG CAPS Take 100 mg by mouth daily. (Patient taking differently: Take 100 mg by mouth as needed. ) 10 capsule 0  . fosinopril (MONOPRIL) 40 MG tablet Take 40 mg by mouth daily.     Marland Kitchen. glipiZIDE (GLUCOTROL) 10 MG tablet Take 10 mg by mouth 2 (two) times daily.     . metFORMIN (GLUCOPHAGE) 1000 MG tablet Take 1,000 mg by mouth 2 (two) times daily with a meal.      . Multiple Vitamins-Minerals (MULTIVITAMIN PO) Take by mouth daily.    . rosuvastatin (CRESTOR) 10 MG tablet Take 2 tablets (20 mg total) by mouth daily. 28 tablet 0   No current facility-administered medications for this visit.    LABS/IMAGING: No results found for this or any previous visit (from the past 48 hour(s)). No results found.  VITALS: BP 154/78 mmHg  Pulse 60  Ht 5' 11.5" (1.816 m)  Wt 151 lb (68.493 kg)  BMI 20.77 kg/m2  EXAM: General appearance: alert and no distress Neck: no adenopathy, no carotid bruit, no JVD, supple, symmetrical, trachea midline and thyroid not enlarged, symmetric, no tenderness/mass/nodules Lungs: clear to auscultation bilaterally Heart: regular rate and rhythm, S1, S2 normal and systolic murmur: early systolic 3/6, crescendo at 2nd right intercostal space Abdomen: soft, non-tender; bowel sounds normal; no masses,  no organomegaly Extremities: extremities normal, atraumatic, no cyanosis or edema Pulses: 2+ and symmetric Skin: Skin color, texture, turgor normal. No rashes or lesions Neurologic: Grossly normal  EKG: Normal sinus rhythm at 60, marked anterolateral deep T-wave inversions  ASSESSMENT: 1. Progressive fatigue with an abnormal EKG demonstrating anterolateral deep T-wave inversions 2. Coronary artery disease status post PCI to the mid LAD in 2009 3. Hypertension 4. Dyslipidemia 5. Diabetes type 2 - controlled, followed by PCP 6. SEM-asymmetric septal hypertrophy / probable hypertrophic cardiomyopathy  PLAN: 1.   [name] has new EKG changes  concerning for possible anterolateral ischemia and a history of PCI to the LAD. He has not had stress testing since that time. Although he's not having a chest pain he did not have any chest pain initially. I recommend a repeat nuclear stress test to rule out any ischemia. If this is negative I would attribute EKG changes to hypertrophic cardiomyopathy. Follow-up with me afterwards.  Chrystie NoseKenneth C. Ammar Moffatt, MD, [facility]ACC Attending Cardiologist [facility]   Chrystie NoseKenneth C Margeret Stachnik 02/06/2016, 8:48 AM

## 2016-02-07 ENCOUNTER — Telehealth (HOSPITAL_COMMUNITY): Payer: Self-pay

## 2016-02-07 NOTE — Telephone Encounter (Signed)
Encounter complete. 

## 2016-02-10 ENCOUNTER — Ambulatory Visit (HOSPITAL_COMMUNITY)
Admission: RE | Admit: 2016-02-10 | Discharge: 2016-02-10 | Disposition: A | Discharge status: Home / Self Care | Payer: Medicare Other | Source: Ambulatory Visit | Attending: Cardiovascular Disease | Admitting: Cardiovascular Disease

## 2016-02-10 DIAGNOSIS — I2510 Atherosclerotic heart disease of native coronary artery without angina pectoris: Secondary | ICD-10-CM | POA: Diagnosis not present

## 2016-02-10 DIAGNOSIS — I10 Essential (primary) hypertension: Secondary | ICD-10-CM | POA: Diagnosis not present

## 2016-02-10 DIAGNOSIS — Z8249 Family history of ischemic heart disease and other diseases of the circulatory system: Secondary | ICD-10-CM | POA: Insufficient documentation

## 2016-02-10 DIAGNOSIS — R9431 Abnormal electrocardiogram [ECG] [EKG]: Secondary | ICD-10-CM | POA: Insufficient documentation

## 2016-02-10 DIAGNOSIS — E119 Type 2 diabetes mellitus without complications: Secondary | ICD-10-CM | POA: Diagnosis not present

## 2016-02-10 DIAGNOSIS — R5383 Other fatigue: Secondary | ICD-10-CM | POA: Diagnosis not present

## 2016-02-10 LAB — MYOCARDIAL PERFUSION IMAGING
CHL CUP NUCLEAR SDS: 2
CHL CUP NUCLEAR SRS: 4
CHL CUP RESTING HR STRESS: 57 {beats}/min
LV sys vol: 45 mL
LVDIAVOL: 100 mL (ref 62–150)
Peak HR: 83 {beats}/min
SSS: 6
TID: 1.1

## 2016-02-10 MED ORDER — TECHNETIUM TC 99M TETROFOSMIN IV KIT
10.5000 | PACK | Freq: Once | INTRAVENOUS | Status: AC | PRN
  Administered 2016-02-10: 11 via INTRAVENOUS
  Filled 2016-02-10: qty 11

## 2016-02-10 MED ORDER — TECHNETIUM TC 99M TETROFOSMIN IV KIT
31.1000 | PACK | Freq: Once | INTRAVENOUS | Status: AC | PRN
  Administered 2016-02-10: 31.1 via INTRAVENOUS
  Filled 2016-02-10: qty 31

## 2016-02-10 MED ORDER — REGADENOSON 0.4 MG/5ML IV SOLN
0.4000 mg | Freq: Once | INTRAVENOUS | Status: AC
  Administered 2016-02-10: 0.4 mg via INTRAVENOUS

## 2016-03-13 ENCOUNTER — Ambulatory Visit (INDEPENDENT_AMBULATORY_CARE_PROVIDER_SITE_OTHER): Payer: Medicare Other | Admitting: Internal Medicine

## 2016-03-13 ENCOUNTER — Encounter: Payer: Self-pay | Admitting: Internal Medicine

## 2016-03-13 ENCOUNTER — Encounter (INDEPENDENT_AMBULATORY_CARE_PROVIDER_SITE_OTHER): Payer: Self-pay

## 2016-03-13 VITALS — BP 128/66 | HR 66 | Ht 71.5 in | Wt 152.0 lb

## 2016-03-13 DIAGNOSIS — R9431 Abnormal electrocardiogram [ECG] [EKG]: Secondary | ICD-10-CM | POA: Diagnosis not present

## 2016-03-13 DIAGNOSIS — E785 Hyperlipidemia, unspecified: Secondary | ICD-10-CM | POA: Diagnosis not present

## 2016-03-13 DIAGNOSIS — I422 Other hypertrophic cardiomyopathy: Secondary | ICD-10-CM | POA: Diagnosis not present

## 2016-03-13 MED ORDER — CARVEDILOL 3.125 MG PO TABS: 3.1250 mg | ORAL_TABLET | Freq: Two times a day (BID) | ORAL | 3 refills | Status: DC

## 2016-03-13 NOTE — Patient Instructions (Signed)
Your carvedilol has been refilled.   Your physician wants you to follow-up in: 6 months with Dr. Rennis GoldenHilty. You will receive a reminder letter in the mail two months in advance. If you don't receive a letter, please call our office to schedule the follow-up appointment.

## 2016-03-13 NOTE — Progress Notes (Signed)
OFFICE NOTE  Chief Complaint:  Follow-up stress test  Primary Care Physician: Evlyn CourierGerald K Hill, MD  HPI:  [name] [name] a 70 year old gentleman with a history of coronary artery disease, status post stent to mid LAD in 2009. 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 He also has hypertension, diabetes, and dyslipidemia. All these have been well controlled. 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  He recently underwent hernia repair surgery which was successful. He says has been doing well without any difficulty. He denies any chest pain or worsening shortness of breath. He does have a loud systolic murmur which was previously noted and does have a history of mild asymmetric septal hypertrophy on echo in 2011. He recent had lab work through his primary care provider and reported that the cholesterol and blood glucose are within normal limits. We are awaiting those results.  I saw Mr. [name] [name] back in the office today. Overall he is doing well except for some mild swelling of the lower extremities. Blood pressure is well-controlled today. He's had no syncopal events. He does have a systolic murmur with asymmetric septal hypertrophy without outflow tract obstruction. This was looked at with echo last year and does not seem to worsen significantly. He denies any chest pain associated with his prior coronary artery disease and had a stent to the mid LAD in 2009.  02/06/2016  Mr. [name] [name] returns today for follow-up. He continues to work for the [employer]. Recently he's had no worsening shortness of breath or chest discomfort but he does not exercise regularly. When asked about energy level he reports he has had some worsening fatigue over the past 6-12 months but attributes that to the hot weather. He does  have a history of hypertrophic cardiomyopathy with his last echo in 2015. His EKG now shows new anterolateral T-wave inversions. These findings could be related to hypertrophic cardiomyopathy at the apex for ischemia in the LAD territory which he previously had.  03/13/2016  Mr. [name] [name] was seen today back in follow-up. He denies any worsening shortness of breath or chest discomfort. He did undergo nuclear stress test which was negative for ischemia, and I suspect his EKG changes are due to changes in his hypertrophic cardiomyopathy. In general his exercise tolerance is decreased somewhat but fairly stable.  PMHx:  Past Medical History:  Diagnosis Date  . Coronary artery disease    a. 10/2007 s/p PCI of LAD (3.5x23 Vision BMS); b. 05/2008 Cath/PCI: LM nl, LAD 6275m, 80 ISR (3.5x28 Promus DES), LCX min irregs, RI nl;  c. 12/2009 MV: EF 61%, no ischemia.  . Diabetes mellitus   . Dyslipidemia   . Hypertension   . Hypertrophic cardiomyopathy (HCC)    a. 03/2014 Echo: EF 60-65%, Gr 1 DD, sev septal basal hypertrophy, no SAM but near mid-cavitary obstruction, mild MR.    Past Surgical History:  Procedure Laterality Date  . BACK SURGERY  1992  . CORONARY STENT PLACEMENT  06/03/2008   Promus stent to mid LAD; 10/23/2007 Vision stent to mid LAD  . FEMORAL ENDARTERECTOMY  2010   right, with bovine patch angioplasty;  post cath, r./t aneursym/claudication  . INGUINAL HERNIA REPAIR Left 08/06/2013   Procedure: HERNIA REPAIR INGUINAL ADULT;  Surgeon: Marlane HatcherWilliam S Bradford, MD;  Location: [facility];  Service: General;  Laterality: Left;  .  TRANSTHORACIC ECHOCARDIOGRAM  01/12/2010   EF>55%; mild concentric LVH; mild MR, mild TR    FAMHx:  Family History  Problem Relation Age of Onset  . Cancer Brother   . Kidney disease Mother   . Heart Problems Mother     SOCHx:   reports that he has never smoked. He has never used smokeless tobacco. He reports that he does not drink alcohol or use drugs.  ALLERGIES:  No  Known Allergies  ROS: Pertinent items noted in HPI and remainder of comprehensive ROS otherwise negative.  HOME MEDS: Current Outpatient Prescriptions  Medication Sig Dispense Refill  . aspirin 325 MG EC tablet Take 81.25 mg by mouth daily.    . carvedilol (COREG) 3.125 MG tablet Take 3.125 mg by mouth 2 (two) times daily with a meal.      . docusate sodium 100 MG CAPS Take 100 mg by mouth daily. (Patient taking differently: Take 100 mg by mouth as needed. ) 10 capsule 0  . fosinopril (MONOPRIL) 40 MG tablet Take 40 mg by mouth daily.     Marland Kitchen. glipiZIDE (GLUCOTROL) 10 MG tablet Take 10 mg by mouth 2 (two) times daily.     . metFORMIN (GLUCOPHAGE) 1000 MG tablet Take 1,000 mg by mouth 2 (two) times daily with a meal.      . Multiple Vitamins-Minerals (MULTIVITAMIN PO) Take by mouth daily.    . rosuvastatin (CRESTOR) 10 MG tablet Take 2 tablets (20 mg total) by mouth daily. 28 tablet 0   No current facility-administered medications for this visit.     LABS/IMAGING: No results found for this or any previous visit (from the past 48 hour(s)). No results found.  VITALS: BP 128/66 (BP Location: Right Arm, Patient Position: Sitting, Cuff Size: Normal)   Pulse 66   Ht 5' 11.5" (1.816 m)   Wt 152 lb (68.9 kg)   SpO2 99%   BMI 20.90 kg/m   EXAM: Deferred  EKG:  Deferred  ASSESSMENT: 1. Progressive fatigue with an abnormal EKG demonstrating anterolateral deep T-wave inversions - negative NST (2017) 2. Coronary artery disease status post PCI to the mid LAD in 2009 3. Hypertension 4. Dyslipidemia 5. Diabetes type 2 - controlled, followed by PCP 6. SEM-asymmetric septal hypertrophy / probable hypertrophic cardiomyopathy  PLAN: 1.   Mr. [name] [name] reports his shortness of breath is somewhat stabilized. This may be due to hot weather and his activity. His stress test was negative for ischemia. He is EKG changes probably suggests changes and hypertrophic cardiomyopathy. I do not suspect  active coronary disease at this time. Follow-up with me in 6 months or sooner as necessary.  Chrystie NoseKenneth C. Marilla Boddy, MD, [facility] Attending Cardiologist [facility]   Chrystie NoseKenneth C Ewan Grau 03/13/2016, 9:16 AM

## 2016-03-19 DIAGNOSIS — N400 Enlarged prostate without lower urinary tract symptoms: Secondary | ICD-10-CM | POA: Diagnosis not present

## 2016-03-19 DIAGNOSIS — Z125 Encounter for screening for malignant neoplasm of prostate: Secondary | ICD-10-CM | POA: Diagnosis not present

## 2016-03-19 DIAGNOSIS — E118 Type 2 diabetes mellitus with unspecified complications: Secondary | ICD-10-CM | POA: Diagnosis not present

## 2016-03-19 DIAGNOSIS — I10 Essential (primary) hypertension: Secondary | ICD-10-CM | POA: Diagnosis not present

## 2016-04-16 DIAGNOSIS — M791 Myalgia: Secondary | ICD-10-CM | POA: Diagnosis not present

## 2016-04-16 DIAGNOSIS — Z6823 Body mass index (BMI) 23.0-23.9, adult: Secondary | ICD-10-CM | POA: Diagnosis not present

## 2016-04-16 DIAGNOSIS — Z23 Encounter for immunization: Secondary | ICD-10-CM | POA: Diagnosis not present

## 2016-04-16 DIAGNOSIS — E118 Type 2 diabetes mellitus with unspecified complications: Secondary | ICD-10-CM | POA: Diagnosis not present

## 2016-06-19 DIAGNOSIS — I10 Essential (primary) hypertension: Secondary | ICD-10-CM | POA: Diagnosis not present

## 2016-06-19 DIAGNOSIS — E118 Type 2 diabetes mellitus with unspecified complications: Secondary | ICD-10-CM | POA: Diagnosis not present

## 2016-10-22 DIAGNOSIS — I10 Essential (primary) hypertension: Secondary | ICD-10-CM | POA: Diagnosis not present

## 2016-10-22 DIAGNOSIS — E785 Hyperlipidemia, unspecified: Secondary | ICD-10-CM | POA: Diagnosis not present

## 2016-10-22 DIAGNOSIS — E118 Type 2 diabetes mellitus with unspecified complications: Secondary | ICD-10-CM | POA: Diagnosis not present

## 2016-12-03 ENCOUNTER — Other Ambulatory Visit: Payer: Self-pay | Admitting: Internal Medicine

## 2016-12-06 ENCOUNTER — Encounter: Payer: Self-pay | Admitting: Internal Medicine

## 2016-12-06 ENCOUNTER — Ambulatory Visit (INDEPENDENT_AMBULATORY_CARE_PROVIDER_SITE_OTHER): Payer: Medicare Other | Admitting: Internal Medicine

## 2016-12-06 VITALS — BP 130/70 | HR 61 | Ht 71.5 in | Wt 153.4 lb

## 2016-12-06 DIAGNOSIS — I2510 Atherosclerotic heart disease of native coronary artery without angina pectoris: Secondary | ICD-10-CM | POA: Diagnosis not present

## 2016-12-06 DIAGNOSIS — I10 Essential (primary) hypertension: Secondary | ICD-10-CM | POA: Diagnosis not present

## 2016-12-06 DIAGNOSIS — E782 Mixed hyperlipidemia: Secondary | ICD-10-CM

## 2016-12-06 DIAGNOSIS — I422 Other hypertrophic cardiomyopathy: Secondary | ICD-10-CM | POA: Diagnosis not present

## 2016-12-06 MED ORDER — ROSUVASTATIN CALCIUM 20 MG PO TABS: 20.0000 mg | ORAL_TABLET | Freq: Every day | ORAL | 5 refills | Status: DC

## 2016-12-06 NOTE — Patient Instructions (Addendum)
Medication Instructions:  RESTART ROSUVASTATIN (CRESTOR) RX SENT TO PHARMACY   START ASPIRIN AT 81 MG DAILY   Labwork: FASTING LIPID PANEL AT THE OFFICE IN 2 MONTHS  Testing/Procedures: Your physician has requested that you have an echocardiogram. Echocardiography is a painless test that uses sound waves to create images of your heart. It provides your doctor with information about the size and shape of your heart and how well your heart's chambers and valves are working. This procedure takes approximately one hour. There are no restrictions for this procedure. [facility] AT [address] 1 YEAR PRIOR TO OFFICE VISIT   Follow-Up: Your physician wants you to follow-up in: 1 YEAR AFTER ECHO You will receive a reminder letter in the mail two months in advance. If you don't receive a letter, please call our office to schedule the follow-up appointment.  If you need a refill on your cardiac medications before your next appointment, please call your pharmacy.

## 2016-12-06 NOTE — Progress Notes (Signed)
OFFICE NOTE  Chief Complaint:  No complaints  Primary Care Physician: Mirna Mires, MD  HPI:  [name] [name] a 71 year old gentleman with a history of coronary artery disease, status post stent to mid LAD in 2009. 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 He also has hypertension, diabetes, and dyslipidemia. All these have been well controlled. 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  He recently underwent hernia repair surgery which was successful. He says has been doing well without any difficulty. He denies any chest pain or worsening shortness of breath. He does have a loud systolic murmur which was previously noted and does have a history of mild asymmetric septal hypertrophy on echo in 2011. He recent had lab work through his primary care provider and reported that the cholesterol and blood glucose are within normal limits. We are awaiting those results.  I saw [name] [name] back in the office today. Overall he is doing well except for some mild swelling of the lower extremities. Blood pressure is well-controlled today. He's had no syncopal events. He does have a systolic murmur with asymmetric septal hypertrophy without outflow tract obstruction. This was looked at with echo last year and does not seem to worsen significantly. He denies any chest pain associated with his prior coronary artery disease and had a stent to the mid LAD in 2009.  02/06/2016  [name] [name] returns today for follow-up. He continues to work for the [employer]. Recently he's had no worsening shortness of breath or chest discomfort but he does not exercise regularly. When asked about energy level he reports he has had some worsening fatigue over the past 6-12 months but attributes that to the hot weather. He does have a  history of hypertrophic cardiomyopathy with his last echo in 2015. His EKG now shows new anterolateral T-wave inversions. These findings could be related to hypertrophic cardiomyopathy at the apex for ischemia in the LAD territory which he previously had.  03/13/2016  [name] [name] was seen today back in follow-up. He denies any worsening shortness of breath or chest discomfort. He did undergo nuclear stress test which was negative for ischemia, and I suspect his EKG changes are due to changes in his hypertrophic cardiomyopathy. In general his exercise tolerance is decreased somewhat but fairly stable.  12/06/2016  [name] [name] returns for follow-up. He denies any new complaints since his shortness of breath or chest discomfort. He says he occasionally gets some muscle spasm in his left arm and shoulder. He denies any chest involvement. He does have history of heart hypertrophic cardiopathy and will be due for repeat echocardiogram next year. He underwent nuclear stress testing last year which was negative for ischemia. He's noted to have some PVCs on his EKG but is asymptomatic with this. Recently I received some laboratory work from his primary care provider which was drawn in April 2018. Creatinine is 1.18, liver enzymes normal, hemoglobin A1c of 7, and total cholesterol 222, triglycerides 90, HDL-C 62, and LDL-C1 42. Our staff did inquire about his dose Crestor - apparently he has not been taking his medication per his recollection for 2 months, however ultimately it seems like he has not filled the prescription since 2016. Previously he was on Crestor 20 mg.  PMHx:  Past Medical History:  Diagnosis Date  . Coronary artery disease  a. 10/2007 s/p PCI of LAD (3.5x23 Vision BMS); b. 05/2008 Cath/PCI: LM nl, LAD 9m, 80 ISR (3.5x28 Promus DES), LCX min irregs, RI nl;  c. 12/2009 MV: EF 61%, no ischemia.  . Diabetes mellitus   . Dyslipidemia   . Hypertension   . Hypertrophic cardiomyopathy (HCC)    a.  03/2014 Echo: EF 60-65%, Gr 1 DD, sev septal basal hypertrophy, no SAM but near mid-cavitary obstruction, mild MR.    Past Surgical History:  Procedure Laterality Date  . BACK SURGERY  1992  . CORONARY STENT PLACEMENT  06/03/2008   Promus stent to mid LAD; 10/23/2007 Vision stent to mid LAD  . FEMORAL ENDARTERECTOMY  2010   right, with bovine patch angioplasty;  post cath, r./t aneursym/claudication  . INGUINAL HERNIA REPAIR Left 08/06/2013   Procedure: HERNIA REPAIR INGUINAL ADULT;  Surgeon: [name], MD;  Location: [facility];  Service: General;  Laterality: Left;  . TRANSTHORACIC ECHOCARDIOGRAM  01/12/2010   EF>55%; mild concentric LVH; mild MR, mild TR    FAMHx:  Family History  Problem Relation Age of Onset  . Kidney disease Mother   . Heart Problems Mother   . Cancer Brother     SOCHx:   reports that he has never smoked. He has never used smokeless tobacco. He reports that he does not drink alcohol or use drugs.  ALLERGIES:  No Known Allergies  ROS: Pertinent items noted in HPI and remainder of comprehensive ROS otherwise negative.  HOME MEDS: Current Outpatient Prescriptions  Medication Sig Dispense Refill  . aspirin EC 81 MG tablet Take 81 mg by mouth daily.    . carvedilol (COREG) 3.125 MG tablet Take 1 tablet (3.125 mg total) by mouth 2 (two) times daily with a meal. Keep appointment. 180 tablet 0  . docusate sodium 100 MG CAPS Take 100 mg by mouth daily. (Patient taking differently: Take 100 mg by mouth as needed. ) 10 capsule 0  . fosinopril (MONOPRIL) 40 MG tablet Take 40 mg by mouth daily.     Marland Kitchen glipiZIDE (GLUCOTROL) 10 MG tablet Take 10 mg by mouth 2 (two) times daily.     . metFORMIN (GLUCOPHAGE) 1000 MG tablet Take 1,000 mg by mouth 2 (two) times daily with a meal.      . Multiple Vitamins-Minerals (MULTIVITAMIN PO) Take by mouth daily.    . rosuvastatin (CRESTOR) 20 MG tablet Take 1 tablet (20 mg total) by mouth daily. 30 tablet 5   No current  facility-administered medications for this visit.     LABS/IMAGING: No results found for this or any previous visit (from the past 48 hour(s)). No results found.  VITALS: BP 130/70   Pulse 61   Ht 5' 11.5" (1.816 m)   Wt 153 lb 6.4 oz (69.6 kg)   BMI 21.10 kg/m   EXAM: General appearance: alert, no distress and Thin Neck: no carotid bruit and no JVD Lungs: clear to auscultation bilaterally Heart: regular rate and rhythm Abdomen: soft, non-tender; bowel sounds normal; no masses,  no organomegaly Extremities: edema Trace left ankle edema Pulses: 2+ and symmetric Skin: Skin color, texture, turgor normal. No rashes or lesions Neurologic: Grossly normal Psych: Pleasant  EKG: Sinus rhythm with PVCs at 66, anterolateral T-wave inversions, unchanged from prior EKG  ASSESSMENT: 1. Coronary artery disease status post PCI to the mid LAD in 2009 - negative NST (2017) 2. Hypertension 3. Dyslipidemia 4. Diabetes type 2 - controlled, followed by PCP 5. SEM-asymmetric septal hypertrophy /  probable hypertrophic cardiomyopathy  PLAN: 1.   [name] [name] denies any new symptoms such as worsening shortness of breath or chest pain. He had a negative nuclear stress test in 2017. LV functions been normal by echo although he does have some features consistent with hypertrophic cardiopathy. I would recommend a repeat echocardiogram prior to his visit next year. Blood pressure is well-controlled. He has been noncompliant for what we can tell with his cholesterol medication. His LDL-C is 142 goal LDL-C less than 70. I like for him to restart Crestor 20 mg daily. He may need increases and that dose or additional medication to reach his target, but I would first like to see compliance with medication. Finally, he has been on aspirin either 325 mg or 162 mg daily. Current guidelines recommend treatment with 81 mg aspirin which provided similar efficacy but less bleeding risk. We provided samples of that for him  today.  Follow-up with me annually or sooner as necessary. We'll plan a repeat lipid profile in 2 months.  Chrystie NoseKenneth C. Jin Capote, MD, [facility]ACC Attending Cardiologist [facility]   Chrystie NoseKenneth C Max Romano 12/06/2016, 8:47 AM

## 2017-01-21 DIAGNOSIS — E118 Type 2 diabetes mellitus with unspecified complications: Secondary | ICD-10-CM | POA: Diagnosis not present

## 2017-01-21 DIAGNOSIS — I10 Essential (primary) hypertension: Secondary | ICD-10-CM | POA: Diagnosis not present

## 2017-02-15 ENCOUNTER — Telehealth: Payer: Self-pay | Admitting: Internal Medicine

## 2017-02-15 DIAGNOSIS — E782 Mixed hyperlipidemia: Secondary | ICD-10-CM

## 2017-02-15 NOTE — Telephone Encounter (Signed)
Patient called regarding need for repeat lipid panel as noted @ May 2018 MD appointment. Lab ordered and order form mailed to patient.   Patient dropped of wellness benefit claim form to be completed. Copy of form made for patient's records and mailed w/lab order and copy + list of dates for requested cardiac testing faxed to [phone] * Dates requested from 2011-2018 for the following: resting EKG, cardiovascular stress test, lipid profile test, echocardiogram, holter monitor, diagnostic cardiac catheterization, carotid artery scan, MRI or CT scan, outpatient emergency room care for evaluation of cardiac symptoms

## 2017-02-28 ENCOUNTER — Other Ambulatory Visit: Payer: Self-pay | Admitting: Internal Medicine

## 2017-04-24 DIAGNOSIS — I10 Essential (primary) hypertension: Secondary | ICD-10-CM | POA: Diagnosis not present

## 2017-04-24 DIAGNOSIS — E118 Type 2 diabetes mellitus with unspecified complications: Secondary | ICD-10-CM | POA: Diagnosis not present

## 2017-04-24 DIAGNOSIS — E785 Hyperlipidemia, unspecified: Secondary | ICD-10-CM | POA: Diagnosis not present

## 2017-04-24 DIAGNOSIS — Z23 Encounter for immunization: Secondary | ICD-10-CM | POA: Diagnosis not present

## 2017-05-08 DIAGNOSIS — Z0000 Encounter for general adult medical examination without abnormal findings: Secondary | ICD-10-CM | POA: Diagnosis not present

## 2017-05-14 ENCOUNTER — Other Ambulatory Visit: Payer: Self-pay

## 2017-05-14 NOTE — Patient Outreach (Signed)
[facility] Care Management  05/14/2017  [name] KinsmanJimmie Gumbs 1946-07-01 [id number]   Medication Adherence call to [name] [name] the reason for this call is because [name] [name] is showing past due under [employer].on Fosinopril 40 mg blood pressure medication spoke to patient he said doctor Hill told him to take half of tablet instead of 1 tablet daily but,now he is back to taking a full tablet daily, patient will order this medication in a couple of days.  Lillia AbedAna Ollison-Moran CPhT Pharmacy Technician [facility] Care Management Direct Dial [phone]  Fax [phone] [email or web address]

## 2017-08-14 DIAGNOSIS — H25813 Combined forms of age-related cataract, bilateral: Secondary | ICD-10-CM | POA: Diagnosis not present

## 2017-08-14 DIAGNOSIS — E119 Type 2 diabetes mellitus without complications: Secondary | ICD-10-CM | POA: Diagnosis not present

## 2017-08-14 DIAGNOSIS — H353131 Nonexudative age-related macular degeneration, bilateral, early dry stage: Secondary | ICD-10-CM | POA: Diagnosis not present

## 2017-08-14 DIAGNOSIS — H25812 Combined forms of age-related cataract, left eye: Secondary | ICD-10-CM | POA: Diagnosis not present

## 2017-08-14 DIAGNOSIS — H25811 Combined forms of age-related cataract, right eye: Secondary | ICD-10-CM | POA: Diagnosis not present

## 2017-08-15 DIAGNOSIS — H2512 Age-related nuclear cataract, left eye: Secondary | ICD-10-CM | POA: Diagnosis not present

## 2017-08-15 DIAGNOSIS — H25812 Combined forms of age-related cataract, left eye: Secondary | ICD-10-CM | POA: Diagnosis not present

## 2017-08-19 DIAGNOSIS — E1165 Type 2 diabetes mellitus with hyperglycemia: Secondary | ICD-10-CM | POA: Diagnosis not present

## 2017-08-19 DIAGNOSIS — I10 Essential (primary) hypertension: Secondary | ICD-10-CM | POA: Diagnosis not present

## 2017-08-19 DIAGNOSIS — E1142 Type 2 diabetes mellitus with diabetic polyneuropathy: Secondary | ICD-10-CM | POA: Diagnosis not present

## 2017-08-23 DIAGNOSIS — H2513 Age-related nuclear cataract, bilateral: Secondary | ICD-10-CM | POA: Diagnosis not present

## 2017-08-23 DIAGNOSIS — K250 Acute gastric ulcer with hemorrhage: Secondary | ICD-10-CM

## 2017-08-23 HISTORY — DX: Acute gastric ulcer with hemorrhage: K25.0

## 2017-08-28 DIAGNOSIS — E118 Type 2 diabetes mellitus with unspecified complications: Secondary | ICD-10-CM | POA: Diagnosis not present

## 2017-08-28 DIAGNOSIS — E1165 Type 2 diabetes mellitus with hyperglycemia: Secondary | ICD-10-CM | POA: Diagnosis not present

## 2017-08-29 DIAGNOSIS — H25811 Combined forms of age-related cataract, right eye: Secondary | ICD-10-CM | POA: Diagnosis not present

## 2017-08-29 DIAGNOSIS — H2511 Age-related nuclear cataract, right eye: Secondary | ICD-10-CM | POA: Diagnosis not present

## 2017-08-30 ENCOUNTER — Other Ambulatory Visit: Payer: Self-pay

## 2017-08-30 ENCOUNTER — Encounter (HOSPITAL_COMMUNITY): Payer: Self-pay | Admitting: Emergency Medicine

## 2017-08-30 ENCOUNTER — Emergency Department (HOSPITAL_COMMUNITY)
Admission: EM | Admit: 2017-08-30 | Discharge: 2017-08-30 | Disposition: A | Discharge status: Home / Self Care | Payer: Medicare Other | Attending: Emergency Medicine | Admitting: Emergency Medicine

## 2017-08-30 ENCOUNTER — Emergency Department (HOSPITAL_COMMUNITY): Payer: Medicare Other

## 2017-08-30 DIAGNOSIS — I2510 Atherosclerotic heart disease of native coronary artery without angina pectoris: Secondary | ICD-10-CM | POA: Insufficient documentation

## 2017-08-30 DIAGNOSIS — Z955 Presence of coronary angioplasty implant and graft: Secondary | ICD-10-CM | POA: Diagnosis not present

## 2017-08-30 DIAGNOSIS — I10 Essential (primary) hypertension: Secondary | ICD-10-CM | POA: Insufficient documentation

## 2017-08-30 DIAGNOSIS — R110 Nausea: Secondary | ICD-10-CM | POA: Diagnosis not present

## 2017-08-30 DIAGNOSIS — E119 Type 2 diabetes mellitus without complications: Secondary | ICD-10-CM | POA: Insufficient documentation

## 2017-08-30 DIAGNOSIS — R5383 Other fatigue: Secondary | ICD-10-CM | POA: Diagnosis not present

## 2017-08-30 DIAGNOSIS — R531 Weakness: Secondary | ICD-10-CM

## 2017-08-30 DIAGNOSIS — R404 Transient alteration of awareness: Secondary | ICD-10-CM | POA: Diagnosis not present

## 2017-08-30 LAB — URINALYSIS, ROUTINE W REFLEX MICROSCOPIC
Bacteria, UA: NONE SEEN
Bilirubin Urine: NEGATIVE
Hgb urine dipstick: NEGATIVE
Ketones, ur: 5 mg/dL — AB
Leukocytes, UA: NEGATIVE
Nitrite: NEGATIVE
PH: 5 (ref 5.0–8.0)
Protein, ur: NEGATIVE mg/dL
RBC / HPF: NONE SEEN RBC/hpf (ref 0–5)
SPECIFIC GRAVITY, URINE: 1.027 (ref 1.005–1.030)
Squamous Epithelial / LPF: NONE SEEN

## 2017-08-30 LAB — CBC WITH DIFFERENTIAL/PLATELET
BASOS PCT: 0 %
Basophils Absolute: 0 10*3/uL (ref 0.0–0.1)
EOS PCT: 1 %
Eosinophils Absolute: 0.1 10*3/uL (ref 0.0–0.7)
HCT: 30 % — ABNORMAL LOW (ref 39.0–52.0)
Hemoglobin: 9.9 g/dL — ABNORMAL LOW (ref 13.0–17.0)
LYMPHS PCT: 13 %
Lymphs Abs: 1.1 10*3/uL (ref 0.7–4.0)
MCH: 29 pg (ref 26.0–34.0)
MCHC: 33 g/dL (ref 30.0–36.0)
MCV: 88 fL (ref 78.0–100.0)
MONO ABS: 0.4 10*3/uL (ref 0.1–1.0)
MONOS PCT: 5 %
Neutro Abs: 6.7 10*3/uL (ref 1.7–7.7)
Neutrophils Relative %: 81 %
PLATELETS: 226 10*3/uL (ref 150–400)
RBC: 3.41 MIL/uL — ABNORMAL LOW (ref 4.22–5.81)
RDW: 13.1 % (ref 11.5–15.5)
WBC: 8.3 10*3/uL (ref 4.0–10.5)

## 2017-08-30 LAB — COMPREHENSIVE METABOLIC PANEL
ALBUMIN: 4.1 g/dL (ref 3.5–5.0)
ALK PHOS: 54 U/L (ref 38–126)
ALT: 12 U/L — ABNORMAL LOW (ref 17–63)
AST: 19 U/L (ref 15–41)
Anion gap: 12 (ref 5–15)
BILIRUBIN TOTAL: 0.8 mg/dL (ref 0.3–1.2)
BUN: 36 mg/dL — AB (ref 6–20)
CALCIUM: 9.7 mg/dL (ref 8.9–10.3)
CO2: 24 mmol/L (ref 22–32)
Chloride: 99 mmol/L — ABNORMAL LOW (ref 101–111)
Creatinine, Ser: 1.01 mg/dL (ref 0.61–1.24)
GFR calc Af Amer: >60 mL/min (ref >60)
GFR calc non Af Amer: >60 mL/min (ref >60)
GLUCOSE: 248 mg/dL — AB (ref 65–99)
Potassium: 4.6 mmol/L (ref 3.5–5.1)
Sodium: 135 mmol/L (ref 135–145)
TOTAL PROTEIN: 6.6 g/dL (ref 6.5–8.1)

## 2017-08-30 LAB — CBG MONITORING, ED: Glucose-Capillary: 198 mg/dL — ABNORMAL HIGH (ref 65–99)

## 2017-08-30 LAB — I-STAT TROPONIN, ED: Troponin i, poc: 0.01 ng/mL (ref 0.00–0.08)

## 2017-08-30 MED ORDER — SODIUM CHLORIDE 0.9 % IV BOLUS (SEPSIS)
500.0000 mL | Freq: Once | INTRAVENOUS | Status: AC
  Administered 2017-08-30: 500 mL via INTRAVENOUS

## 2017-08-30 MED ORDER — ONDANSETRON 4 MG PO TBDP: 4.0000 mg | ORAL_TABLET | Freq: Three times a day (TID) | ORAL | 0 refills | Status: DC | PRN

## 2017-08-30 NOTE — Discharge Instructions (Signed)
You have been seen in the Emergency Department (ED) today for nausea and weakness.  Your work up today has not shown a clear cause for your symptoms. You have been prescribed Zofran; please use as prescribed as needed for your nausea.  Follow up with your doctor as soon as possible regarding today?s emergent visit and your symptoms of nausea.   Return to the ED if you develop abdominal, bloody vomiting, bloody diarrhea, if you are unable to tolerate fluids due to vomiting, or if you develop other symptoms that concern you.

## 2017-08-30 NOTE — ED Provider Notes (Signed)
Emergency Department Provider Note   I have reviewed the triage vital signs and the nursing notes.   HISTORY  Chief Complaint Weakness   HPI Philippe Cassatt is a 72 y.o. male with PMH of CAD, HLD, DM, HTN, and cardiomyopathy presents to the ED with generalized fatigue over the last week. Patient with associated nausea but denies any vomiting or diarrhea. No fever or chills. Appetite has been decreased. No abdominal or chest pain. No dyspnea. No sick contacts. He did have cataract surgery yesterday and reports getting some medication for mild sedation at that time but symptoms were present before this procedure. No radiation of symptoms or modifying factors. Denies any dysuria, hesitancy, or urgency.   Past Medical History:  Diagnosis Date  . Coronary artery disease    a. 10/2007 s/p PCI of LAD (3.5x23 Vision BMS); b. 05/2008 Cath/PCI: LM nl, LAD 22m, 80 ISR (3.5x28 Promus DES), LCX min irregs, RI nl;  c. 12/2009 MV: EF 61%, no ischemia.  . Diabetes mellitus   . Dyslipidemia   . Hypertension   . Hypertrophic cardiomyopathy (HCC)    a. 03/2014 Echo: EF 60-65%, Gr 1 DD, sev septal basal hypertrophy, no SAM but near mid-cavitary obstruction, mild MR.    Patient Active Problem List   Diagnosis Date Noted  . Abnormal EKG 02/06/2016  . Other fatigue 02/06/2016  . Hypertrophic cardiomyopathy (HCC)   . Dyslipidemia   . Hypertension   . Coronary artery disease   . Type II diabetes mellitus (HCC)   . Inguinal hernia 08/06/2013  . Preoperative cardiovascular examination 07/29/2013  . CAD (coronary artery disease) 02/26/2013  . INSECT BITE 03/07/2009  . ANEMIA, NORMOCYTIC 10/27/2008  . SYNCOPE 08/07/2007  . ECHOCARDIOGRAM, ABNORMAL 08/07/2007  . DIABETES MELLITUS, TYPE II, CONTROLLED 06/16/2007  . ELECTROCARDIOGRAM, ABNORMAL 03/28/2007  . DEGENERATIVE DISC DISEASE 01/01/2007  . ERECTILE DYSFUNCTION 12/27/2006  . Hyperlipidemia 09/06/2006  . Essential hypertension 09/06/2006  .  CONSTIPATION 09/06/2006    Past Surgical History:  Procedure Laterality Date  . BACK SURGERY  1992  . CORONARY STENT PLACEMENT  06/03/2008   Promus stent to mid LAD; 10/23/2007 Vision stent to mid LAD  . FEMORAL ENDARTERECTOMY  2010   right, with bovine patch angioplasty;  post cath, r./t aneursym/claudication  . INGUINAL HERNIA REPAIR Left 08/06/2013   Procedure: HERNIA REPAIR INGUINAL ADULT;  Surgeon: [name], MD;  Location: [facility];  Service: General;  Laterality: Left;  . TRANSTHORACIC ECHOCARDIOGRAM  01/12/2010   EF>55%; mild concentric LVH; mild MR, mild TR    Current Outpatient Rx  . Order #: [id number] Class: Historical Med  . Order #: [id number] Class: Normal  . Order #: [id number] Class: Normal  . Order #: [id number] Class: Historical Med  . Order #: [id number] Class: Historical Med  . Order #: [id number] Class: Historical Med  . Order #: [id number] Class: Historical Med  . Order #: [id number] Class: Print  . Order #: [id number] Class: Normal    Allergies Patient has no known allergies.  Family History  Problem Relation Age of Onset  . Kidney disease Mother   . Heart Problems Mother   . Cancer Brother     Social History Social History   Tobacco Use  . Smoking status: Never Smoker  . Smokeless tobacco: Never Used  Substance Use Topics  . Alcohol use: No  . Drug use: No    Review of Systems  Constitutional: No fever/chills. Positive generalized fatigue.  Eyes: No visual changes. ENT: No sore throat. Cardiovascular: Denies chest pain. Respiratory:  Denies shortness of breath. Gastrointestinal: No abdominal pain. Positive nausea, no vomiting.  No diarrhea.  No constipation. Genitourinary: Negative for dysuria. Musculoskeletal: Negative for back pain. Skin: Negative for rash. Neurological: Negative for headaches, focal weakness or numbness.  10-point ROS otherwise negative.  ____________________________________________   PHYSICAL EXAM:  VITAL SIGNS: ED  Triage Vitals  Enc Vitals Group     BP 08/30/17 1039 115/65     Pulse Rate 08/30/17 1039 82     Resp 08/30/17 1039 18     Temp 08/30/17 1039 (!) 97.5 F (36.4 C)     Temp src --      SpO2 08/30/17 1039 100 %     Pain Score 08/30/17 1038 6   Constitutional: Alert and oriented. Well appearing and in no acute distress. Eyes: Conjunctivae are normal. Head: Atraumatic. Nose: No congestion/rhinnorhea. Mouth/Throat: Mucous membranes are moist.  Oropharynx non-erythematous. Neck: No stridor.   Cardiovascular: Normal rate, regular rhythm. Good peripheral circulation. Grossly normal heart sounds.   Respiratory: Normal respiratory effort.  No retractions. Lungs CTAB. Gastrointestinal: Soft and nontender. No distention.  Musculoskeletal: No lower extremity tenderness nor edema. No gross deformities of extremities. Neurologic:  Normal speech and language. No gross focal neurologic deficits are appreciated.  Skin:  Skin is warm, dry and intact. No rash noted.  ____________________________________________   LABS (all labs ordered are listed, but only abnormal results are displayed)  Labs Reviewed  URINALYSIS, ROUTINE W REFLEX MICROSCOPIC - Abnormal; Notable for the following components:      Result Value   Color, Urine STRAW (*)    Glucose, UA >=500 (*)    Ketones, ur 5 (*)    All other components within normal limits  COMPREHENSIVE METABOLIC PANEL - Abnormal; Notable for the following components:   Chloride 99 (*)    Glucose, Bld 248 (*)    BUN 36 (*)    ALT 12 (*)    All other components within normal limits  CBC WITH DIFFERENTIAL/PLATELET - Abnormal; Notable for the following components:   RBC 3.41 (*)    Hemoglobin 9.9 (*)    HCT 30.0 (*)    All other components within normal limits  CBG MONITORING, ED - Abnormal; Notable for the following components:   Glucose-Capillary 198 (*)    All other components within normal limits  I-STAT TROPONIN, ED    ____________________________________________  EKG   EKG Interpretation  Date/Time:  Friday August 30 2017 11:26:48 EST Ventricular Rate:  73 PR Interval:    QRS Duration: 100 QT Interval:  387 QTC Calculation: 427 R Axis:   -71 Text Interpretation:  Sinus rhythm LAD, consider LAFB or inferior infarct Abnormal T, consider ischemia, lateral leads No STEMI.  Confirmed by Alona BeneLong, Ojani Berenson [phone]) on 08/30/2017 11:31:43 AM       ____________________________________________  RADIOLOGY  Dg Chest 2 View  Result Date: 08/30/2017 CLINICAL DATA:  Nausea, weakness, diaphoresis, dizziness, hypertension EXAM: CHEST  2 VIEW COMPARISON:  07/29/2014 FINDINGS: Normal heart size, mediastinal contours, and pulmonary vascularity. Atherosclerotic calcification aorta. Lungs clear. No pleural effusion or pneumothorax. Bones unremarkable. IMPRESSION: Normal exam. Electronically Signed   By: Ulyses SouthwardMark  Boles M.D.   On: 08/30/2017 11:42    ____________________________________________   PROCEDURES  Procedure(s) performed:   Procedures   None ____________________________________________   INITIAL IMPRESSION / ASSESSMENT AND PLAN / ED COURSE  Pertinent labs & imaging results that were available during my care of the patient were reviewed by me and considered in my medical  decision making (see chart for details).  Patient presents to the emergency department with nausea, generalized weakness, loss of appetite over the last week.  No fevers or pain.  Patient with no focal neurological deficits on exam.  Appears slightly dehydrated.  Plan for IV fluids and Zofran.  Abdomen is completely soft and nontender to deep palpation.  Labs pending including workup for CAD to evaluate for atypical ACS symptoms. EKG from triage reviewed with no evidence of acute ischemia.   01:01 PM Reevaluated patient.  He is feeling better after IV fluids and Zofran.  Labs are largely unremarkable including urine.  Chest x-ray with  no acute abnormality.  He does have hyperglycemia but no evidence of DKA.  Plan to continue glipizide and metformin but follow-up with his primary care physician regarding these medications and his blood sugar control at home.  No indication at this time to suggest underlying coronary artery disease as the cause of his current symptoms. Po challenging now and will reassess.   01:52 PM Patient had a full lunch here in the emergency department with no nausea or vomiting.  Weakness has improved significantly since arrival.  Plan for discharge home with Zofran and close primary care physician follow-up.  Discussed return precautions to the emergency department in detail with the patient and his wife at bedside.  At this time, I do not feel there is any life-threatening condition present. I have reviewed and discussed all results (EKG, imaging, lab, urine as appropriate), exam findings with patient. I have reviewed nursing notes and appropriate previous records.  I feel the patient is safe to be discharged home without further emergent workup. Discussed usual and customary return precautions. Patient and family (if present) verbalize understanding and are comfortable with this plan.  Patient will follow-up with their primary care provider. If they do not have a primary care provider, information for follow-up has been provided to them. All questions have been answered.  ____________________________________________  FINAL CLINICAL IMPRESSION(S) / ED DIAGNOSES  Final diagnoses:  Generalized weakness  Nausea     MEDICATIONS GIVEN DURING THIS VISIT:  Medications  sodium chloride 0.9 % bolus 500 mL (500 mLs Intravenous New Bag/Given 08/30/17 1142)     NEW OUTPATIENT MEDICATIONS STARTED DURING THIS VISIT:  New Prescriptions   ONDANSETRON (ZOFRAN ODT) 4 MG DISINTEGRATING TABLET    Take 1 tablet (4 mg total) by mouth every 8 (eight) hours as needed for nausea or vomiting.    Note:  This document was  prepared using Dragon voice recognition software and may include unintentional dictation errors.  Alona Bene, MD Emergency Medicine    Hser Belanger, Arlyss Repress, MD 08/30/17 [phone]

## 2017-08-30 NOTE — ED Triage Notes (Signed)
Pt c/o of nausea, weakness and loss of appetite since last Saturday. Denies fever.

## 2017-08-30 NOTE — ED Notes (Signed)
Pt w cataract surgery  Yesterday  Weak for one week

## 2017-08-30 NOTE — ED Notes (Signed)
Meal provided 

## 2017-09-02 ENCOUNTER — Ambulatory Visit: Payer: Medicare Other | Admitting: Internal Medicine

## 2017-09-02 ENCOUNTER — Telehealth: Payer: Self-pay | Admitting: Internal Medicine

## 2017-09-02 VITALS — BP 111/63 | HR 67 | Ht 71.5 in | Wt 146.0 lb

## 2017-09-02 DIAGNOSIS — I422 Other hypertrophic cardiomyopathy: Secondary | ICD-10-CM

## 2017-09-02 DIAGNOSIS — R531 Weakness: Secondary | ICD-10-CM

## 2017-09-02 DIAGNOSIS — I2510 Atherosclerotic heart disease of native coronary artery without angina pectoris: Secondary | ICD-10-CM

## 2017-09-02 DIAGNOSIS — R002 Palpitations: Secondary | ICD-10-CM | POA: Diagnosis not present

## 2017-09-02 NOTE — Patient Instructions (Signed)
NO CHANGE WITH MEDICATIONS    SCHEDULE AT [address] Your physician has recommended that you wear an event monitor-2 WEEKS . Event monitors are medical devices that record the heart's electrical activity. Doctors most often us these monitors to diagnose arrhythmias. Arrhythmias are problems with the speed or rhythm of the heartbeat. The monitor is a small, portable device. You can wear one while you do your normal daily activities. This is usually used to diagnose what is causing palpitations/syncope (passing out).    RESCHEDULE FOR ECHO THIS MONTH FROM MAY 2019    Your physician recommends that you schedule a follow-up appointment in 2 WEEKS WITH DR HILTY.

## 2017-09-02 NOTE — Telephone Encounter (Signed)
Returned the call to the patient. He stated that for the last couple of weeks he has been having weakness and off and on left arm pain. He denies chest pain and shortness of breath.   The patient went to the ED on 2/8 for weakness. He has requested a follow up with Dr Rennis GoldenHilty. One has been made for today.

## 2017-09-02 NOTE — Telephone Encounter (Signed)
New message    Patient spouse calling to report weakness. Wants an appoint ment for today. No chest pain. Not SOB.  Patient c/o Palpitations:  High priority if patient c/o lightheadedness, shortness of breath, or chest pain  1) How long have you had palpitations/irregular HR/ Afib? Are you having the symptoms now? 1 week of feeling weak   2) Are you currently experiencing lightheadedness, SOB or CP? dizziness  3) Do you have a history of afib (atrial fibrillation) or irregular heart rhythm? stent  Have you checked your BP or HR? (document readings if available): n/a 4) Are you experiencing any other symptoms? weakness

## 2017-09-04 ENCOUNTER — Encounter: Payer: Self-pay | Admitting: Internal Medicine

## 2017-09-04 DIAGNOSIS — R002 Palpitations: Secondary | ICD-10-CM | POA: Insufficient documentation

## 2017-09-04 DIAGNOSIS — E118 Type 2 diabetes mellitus with unspecified complications: Secondary | ICD-10-CM | POA: Diagnosis not present

## 2017-09-04 DIAGNOSIS — Z125 Encounter for screening for malignant neoplasm of prostate: Secondary | ICD-10-CM | POA: Diagnosis not present

## 2017-09-04 DIAGNOSIS — K625 Hemorrhage of anus and rectum: Secondary | ICD-10-CM | POA: Diagnosis not present

## 2017-09-04 DIAGNOSIS — D649 Anemia, unspecified: Secondary | ICD-10-CM | POA: Diagnosis not present

## 2017-09-04 NOTE — Progress Notes (Signed)
OFFICE NOTE  Chief Complaint:  Chest pain, left arm pressure, fatigue, lethargy, hiccoughs  Primary Care Physician: Mirna Mires, MD  HPI:  [name] [name] a 72 year old gentleman with a history of coronary artery disease, status post stent to mid LAD in 2009. 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 He also has hypertension, diabetes, and dyslipidemia. All these have been well controlled. 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  He recently underwent hernia repair surgery which was successful. He says has been doing well without any difficulty. He denies any chest pain or worsening shortness of breath. He does have a loud systolic murmur which was previously noted and does have a history of mild asymmetric septal hypertrophy on echo in 2011. He recent had lab work through his primary care provider and reported that the cholesterol and blood glucose are within normal limits. We are awaiting those results.  I saw [name] [name] back in the office today. Overall he is doing well except for some mild swelling of the lower extremities. Blood pressure is well-controlled today. He's had no syncopal events. He does have a systolic murmur with asymmetric septal hypertrophy without outflow tract obstruction. This was looked at with echo last year and does not seem to worsen significantly. He denies any chest pain associated with his prior coronary artery disease and had a stent to the mid LAD in 2009.  02/06/2016  [name] [name] returns today for follow-up. He continues to work for the [employer]. Recently he's had no worsening shortness of breath or chest discomfort but he does not exercise regularly. When asked about energy level he reports he has had some worsening fatigue over the past 6-12 months but  attributes that to the hot weather. He does have a history of hypertrophic cardiomyopathy with his last echo in 2015. His EKG now shows new anterolateral T-wave inversions. These findings could be related to hypertrophic cardiomyopathy at the apex for ischemia in the LAD territory which he previously had.  03/13/2016  [name] [name] was seen today back in follow-up. He denies any worsening shortness of breath or chest discomfort. He did undergo nuclear stress test which was negative for ischemia, and I suspect his EKG changes are due to changes in his hypertrophic cardiomyopathy. In general his exercise tolerance is decreased somewhat but fairly stable.  12/06/2016  [name] [name] returns for follow-up. He denies any new complaints since his shortness of breath or chest discomfort. He says he occasionally gets some muscle spasm in his left arm and shoulder. He denies any chest involvement. He does have history of heart hypertrophic cardiopathy and will be due for repeat echocardiogram next year. He underwent nuclear stress testing last year which was negative for ischemia. He's noted to have some PVCs on his EKG but is asymptomatic with this. Recently I received some laboratory work from his primary care provider which was drawn in April 2018. Creatinine is 1.18, liver enzymes normal, hemoglobin A1c of 7, and total cholesterol 222, triglycerides 90, HDL-C 62, and LDL-C1 42. Our staff did inquire about his dose Crestor - apparently he has not been taking his medication per his recollection for 2 months, however ultimately it seems like he has not filled the prescription since 2016. Previously he was on Crestor 20 mg.  09/03/2017  [name] [name] was seen today in  follow-up.  He has a Chief Operating Officerlaundry list of complaints.  He is complaining of some chest discomfort and left arm pressure, fatigue and lethargy, occasional lightheadedness and several weeks of hiccups.  He was seen in the ER several days ago for generalized weakness.   He was noted to have nausea and essentially had no significant findings.  He was given a saline bolus and discharged.  Today blood pressure appears appropriate and heart rate is normal.  He was scheduled for a follow-up echocardiogram in May which we will likely due earlier.  In addition he notes some intermittent palpitations.  It is possible that the symptoms which are associated with fatigue could represent something like atrial fibrillation.  PMHx:  Past Medical History:  Diagnosis Date  . Coronary artery disease    a. 10/2007 s/p PCI of LAD (3.5x23 Vision BMS); b. 05/2008 Cath/PCI: LM nl, LAD 2190m, 80 ISR (3.5x28 Promus DES), LCX min irregs, RI nl;  c. 12/2009 MV: EF 61%, no ischemia.  . Diabetes mellitus   . Dyslipidemia   . Hypertension   . Hypertrophic cardiomyopathy (HCC)    a. 03/2014 Echo: EF 60-65%, Gr 1 DD, sev septal basal hypertrophy, no SAM but near mid-cavitary obstruction, mild MR.    Past Surgical History:  Procedure Laterality Date  . BACK SURGERY  1992  . CORONARY STENT PLACEMENT  06/03/2008   Promus stent to mid LAD; 10/23/2007 Vision stent to mid LAD  . FEMORAL ENDARTERECTOMY  2010   right, with bovine patch angioplasty;  post cath, r./t aneursym/claudication  . INGUINAL HERNIA REPAIR Left 08/06/2013   Procedure: HERNIA REPAIR INGUINAL ADULT;  Surgeon: Marlane HatcherWilliam S Bradford, MD;  Location: [facility];  Service: General;  Laterality: Left;  . TRANSTHORACIC ECHOCARDIOGRAM  01/12/2010   EF>55%; mild concentric LVH; mild MR, mild TR    FAMHx:  Family History  Problem Relation Age of Onset  . Kidney disease Mother   . Heart Problems Mother   . Cancer Brother     SOCHx:   reports that  has never smoked. he has never used smokeless tobacco. He reports that he does not drink alcohol or use drugs.  ALLERGIES:  No Known Allergies  ROS: Pertinent items noted in HPI and remainder of comprehensive ROS otherwise negative.  HOME MEDS: Current Outpatient Medications  Medication  Sig Dispense Refill  . aspirin EC 81 MG tablet Take 81 mg by mouth daily.    . carvedilol (COREG) 3.125 MG tablet TAKE ONE TABLET BY MOUTH TWICE DAILY 180 tablet 2  . docusate sodium 100 MG CAPS Take 100 mg by mouth daily. (Patient taking differently: Take 100 mg by mouth as needed. ) 10 capsule 0  . fosinopril (MONOPRIL) 40 MG tablet Take 40 mg by mouth daily.     Marland Kitchen. glipiZIDE (GLUCOTROL) 10 MG tablet Take 10 mg by mouth 2 (two) times daily.     . metFORMIN (GLUCOPHAGE) 1000 MG tablet Take 1,000 mg by mouth 2 (two) times daily with a meal.      . Multiple Vitamins-Minerals (MULTIVITAMIN PO) Take by mouth daily.    . ondansetron (ZOFRAN ODT) 4 MG disintegrating tablet Take 1 tablet (4 mg total) by mouth every 8 (eight) hours as needed for nausea or vomiting. 20 tablet 0  . rosuvastatin (CRESTOR) 20 MG tablet Take 1 tablet (20 mg total) by mouth daily. 30 tablet 5   No current facility-administered medications for this visit.     LABS/IMAGING: No results found for this  or any previous visit (from the past 48 hour(s)). No results found.  VITALS: BP 111/63   Pulse 67   Ht 5' 11.5" (1.816 m)   Wt 146 lb (66.2 kg)   BMI 20.08 kg/m   EXAM: General appearance: alert, no distress and Thin Neck: no carotid bruit and no JVD Lungs: clear to auscultation bilaterally Heart: regular rate and rhythm Abdomen: soft, non-tender; bowel sounds normal; no masses,  no organomegaly Extremities: edema Trace left ankle edema Pulses: 2+ and symmetric Skin: Skin color, texture, turgor normal. No rashes or lesions Neurologic: Grossly normal Psych: Pleasant  EKG: Deferred  ASSESSMENT: 1. Coronary artery disease status post PCI to the mid LAD in 2009 - negative NST (2017) 2. Hypertension 3. Dyslipidemia 4. Diabetes type 2 - controlled, followed by PCP 5. SEM-asymmetric septal hypertrophy / probable hypertrophic cardiomyopathy 6. Palpitations 7. Fatigue  PLAN: 1.   [name] [name] has a number of  complaints including chest pain, left arm pressure, fatigue and occasional lightheadedness as well as hiccups.  He is describing what sound like possible palpitations associated with fatigue and may represent A. fib.  I like to place him on a monitor to further evaluate this.  We will also get an echocardiogram to evaluate for any new wall motion abnormalities.  He does have coronary disease with a stent to the mid LAD in 2009 but had a negative nuclear stress test in 2017.  If his symptoms persist he may need repeat heart catheterization.  Follow-up with me after the studies.  [name], MD, [facility], FACP  [place]  [facility]  Medical Director of the [facility]  [facility] Diplomate of the American Board of Clinical Lipidology Attending Cardiologist  Direct Dial: [phone]  Fax: [phone]  Website:  [email or web address] Nicely Greenlee Ancheta 09/04/2017, 11:18 AM

## 2017-09-05 ENCOUNTER — Encounter (HOSPITAL_COMMUNITY): Payer: Self-pay

## 2017-09-05 ENCOUNTER — Emergency Department (HOSPITAL_COMMUNITY): Payer: Medicare Other

## 2017-09-05 ENCOUNTER — Observation Stay (HOSPITAL_COMMUNITY)
Admission: EM | Admit: 2017-09-05 | Discharge: 2017-09-06 | Disposition: A | Discharge status: Home / Self Care | Payer: Medicare Other | Attending: Internal Medicine | Admitting: Internal Medicine

## 2017-09-05 DIAGNOSIS — K921 Melena: Secondary | ICD-10-CM | POA: Diagnosis not present

## 2017-09-05 DIAGNOSIS — R531 Weakness: Secondary | ICD-10-CM | POA: Diagnosis not present

## 2017-09-05 DIAGNOSIS — I422 Other hypertrophic cardiomyopathy: Secondary | ICD-10-CM | POA: Diagnosis not present

## 2017-09-05 DIAGNOSIS — K259 Gastric ulcer, unspecified as acute or chronic, without hemorrhage or perforation: Secondary | ICD-10-CM | POA: Diagnosis not present

## 2017-09-05 DIAGNOSIS — I700 Atherosclerosis of aorta: Secondary | ICD-10-CM | POA: Diagnosis not present

## 2017-09-05 DIAGNOSIS — D500 Iron deficiency anemia secondary to blood loss (chronic): Secondary | ICD-10-CM | POA: Diagnosis not present

## 2017-09-05 DIAGNOSIS — N400 Enlarged prostate without lower urinary tract symptoms: Secondary | ICD-10-CM | POA: Diagnosis not present

## 2017-09-05 DIAGNOSIS — E785 Hyperlipidemia, unspecified: Secondary | ICD-10-CM | POA: Diagnosis present

## 2017-09-05 DIAGNOSIS — I2510 Atherosclerotic heart disease of native coronary artery without angina pectoris: Secondary | ICD-10-CM | POA: Diagnosis not present

## 2017-09-05 DIAGNOSIS — R710 Precipitous drop in hematocrit: Secondary | ICD-10-CM | POA: Diagnosis not present

## 2017-09-05 DIAGNOSIS — K254 Chronic or unspecified gastric ulcer with hemorrhage: Principal | ICD-10-CM | POA: Insufficient documentation

## 2017-09-05 DIAGNOSIS — K2970 Gastritis, unspecified, without bleeding: Secondary | ICD-10-CM | POA: Diagnosis not present

## 2017-09-05 DIAGNOSIS — R195 Other fecal abnormalities: Secondary | ICD-10-CM

## 2017-09-05 DIAGNOSIS — I10 Essential (primary) hypertension: Secondary | ICD-10-CM | POA: Diagnosis present

## 2017-09-05 DIAGNOSIS — Z955 Presence of coronary angioplasty implant and graft: Secondary | ICD-10-CM | POA: Diagnosis not present

## 2017-09-05 DIAGNOSIS — D649 Anemia, unspecified: Secondary | ICD-10-CM | POA: Diagnosis not present

## 2017-09-05 DIAGNOSIS — K760 Fatty (change of) liver, not elsewhere classified: Secondary | ICD-10-CM | POA: Insufficient documentation

## 2017-09-05 DIAGNOSIS — R109 Unspecified abdominal pain: Secondary | ICD-10-CM | POA: Diagnosis not present

## 2017-09-05 DIAGNOSIS — E1151 Type 2 diabetes mellitus with diabetic peripheral angiopathy without gangrene: Secondary | ICD-10-CM | POA: Insufficient documentation

## 2017-09-05 DIAGNOSIS — Z794 Long term (current) use of insulin: Secondary | ICD-10-CM | POA: Diagnosis not present

## 2017-09-05 DIAGNOSIS — E119 Type 2 diabetes mellitus without complications: Secondary | ICD-10-CM

## 2017-09-05 DIAGNOSIS — R5383 Other fatigue: Secondary | ICD-10-CM | POA: Diagnosis not present

## 2017-09-05 DIAGNOSIS — Z7982 Long term (current) use of aspirin: Secondary | ICD-10-CM | POA: Diagnosis not present

## 2017-09-05 DIAGNOSIS — K250 Acute gastric ulcer with hemorrhage: Secondary | ICD-10-CM

## 2017-09-05 DIAGNOSIS — R9431 Abnormal electrocardiogram [ECG] [EKG]: Secondary | ICD-10-CM | POA: Diagnosis present

## 2017-09-05 DIAGNOSIS — K922 Gastrointestinal hemorrhage, unspecified: Secondary | ICD-10-CM | POA: Diagnosis present

## 2017-09-05 DIAGNOSIS — Z79899 Other long term (current) drug therapy: Secondary | ICD-10-CM | POA: Diagnosis not present

## 2017-09-05 LAB — HEPATIC FUNCTION PANEL
ALBUMIN: 3.8 g/dL (ref 3.5–5.0)
ALT: 14 U/L — AB (ref 17–63)
AST: 17 U/L (ref 15–41)
Alkaline Phosphatase: 57 U/L (ref 38–126)
BILIRUBIN TOTAL: 0.8 mg/dL (ref 0.3–1.2)
Bilirubin, Direct: <0.1 mg/dL — ABNORMAL LOW (ref 0.1–0.5)
Total Protein: 6.6 g/dL (ref 6.5–8.1)

## 2017-09-05 LAB — CBC
HCT: 27.2 % — ABNORMAL LOW (ref 39.0–52.0)
Hemoglobin: 9 g/dL — ABNORMAL LOW (ref 13.0–17.0)
MCH: 29.4 pg (ref 26.0–34.0)
MCHC: 33.1 g/dL (ref 30.0–36.0)
MCV: 88.9 fL (ref 78.0–100.0)
PLATELETS: 359 10*3/uL (ref 150–400)
RBC: 3.06 MIL/uL — ABNORMAL LOW (ref 4.22–5.81)
RDW: 14 % (ref 11.5–15.5)
WBC: 5.4 10*3/uL (ref 4.0–10.5)

## 2017-09-05 LAB — I-STAT TROPONIN, ED: Troponin i, poc: 0.01 ng/mL (ref 0.00–0.08)

## 2017-09-05 LAB — BASIC METABOLIC PANEL
Anion gap: 11 (ref 5–15)
BUN: 14 mg/dL (ref 6–20)
CALCIUM: 9.2 mg/dL (ref 8.9–10.3)
CHLORIDE: 102 mmol/L (ref 101–111)
CO2: 23 mmol/L (ref 22–32)
CREATININE: 0.89 mg/dL (ref 0.61–1.24)
GFR calc Af Amer: >60 mL/min (ref >60)
GFR calc non Af Amer: >60 mL/min (ref >60)
GLUCOSE: 168 mg/dL — AB (ref 65–99)
Potassium: 4.2 mmol/L (ref 3.5–5.1)
Sodium: 136 mmol/L (ref 135–145)

## 2017-09-05 LAB — URINALYSIS, ROUTINE W REFLEX MICROSCOPIC
Bacteria, UA: NONE SEEN
Bilirubin Urine: NEGATIVE
Ketones, ur: NEGATIVE mg/dL
Leukocytes, UA: NEGATIVE
Nitrite: NEGATIVE
PH: 5 (ref 5.0–8.0)
Protein, ur: NEGATIVE mg/dL
SPECIFIC GRAVITY, URINE: 1.027 (ref 1.005–1.030)
SQUAMOUS EPITHELIAL / LPF: NONE SEEN

## 2017-09-05 LAB — POC OCCULT BLOOD, ED: Fecal Occult Bld: POSITIVE — AB

## 2017-09-05 LAB — HEMOGLOBIN AND HEMATOCRIT, BLOOD
HCT: 23.6 % — ABNORMAL LOW (ref 39.0–52.0)
HEMATOCRIT: 25.8 % — AB (ref 39.0–52.0)
HEMOGLOBIN: 7.8 g/dL — AB (ref 13.0–17.0)
Hemoglobin: 8.4 g/dL — ABNORMAL LOW (ref 13.0–17.0)

## 2017-09-05 LAB — PREPARE RBC (CROSSMATCH)

## 2017-09-05 MED ORDER — TRAZODONE HCL 50 MG PO TABS: 25.0000 mg | ORAL_TABLET | Freq: Every evening | ORAL | Status: DC | PRN

## 2017-09-05 MED ORDER — SODIUM CHLORIDE 0.9 % IV SOLN
INTRAVENOUS | Status: DC
  Administered 2017-09-05: 14:00:00 via INTRAVENOUS

## 2017-09-05 MED ORDER — ONDANSETRON HCL 4 MG PO TABS: 4.0000 mg | ORAL_TABLET | Freq: Four times a day (QID) | ORAL | Status: DC | PRN

## 2017-09-05 MED ORDER — LISINOPRIL 40 MG PO TABS
40.0000 mg | ORAL_TABLET | Freq: Every day | ORAL | Status: DC
  Administered 2017-09-05 – 2017-09-06 (×2): 40 mg via ORAL
  Filled 2017-09-05: qty 2
  Filled 2017-09-05: qty 1

## 2017-09-05 MED ORDER — CARVEDILOL 3.125 MG PO TABS
3.1250 mg | ORAL_TABLET | Freq: Two times a day (BID) | ORAL | Status: DC
  Administered 2017-09-05 – 2017-09-06 (×2): 3.125 mg via ORAL
  Filled 2017-09-05 (×3): qty 1

## 2017-09-05 MED ORDER — ACETAMINOPHEN 325 MG PO TABS: 650.0000 mg | ORAL_TABLET | Freq: Four times a day (QID) | ORAL | Status: DC | PRN

## 2017-09-05 MED ORDER — PANTOPRAZOLE SODIUM 40 MG IV SOLR
40.0000 mg | Freq: Two times a day (BID) | INTRAVENOUS | Status: DC
  Administered 2017-09-05 – 2017-09-06 (×2): 40 mg via INTRAVENOUS
  Filled 2017-09-05 (×2): qty 40

## 2017-09-05 MED ORDER — FERROUS SULFATE 325 (65 FE) MG PO TABS: 325.0000 mg | ORAL_TABLET | Freq: Every day | ORAL | Status: DC | PRN

## 2017-09-05 MED ORDER — IOPAMIDOL (ISOVUE-300) INJECTION 61%
INTRAVENOUS | Status: AC
  Administered 2017-09-05: 100 mL
  Filled 2017-09-05: qty 100

## 2017-09-05 MED ORDER — PANTOPRAZOLE SODIUM 40 MG PO TBEC: 40.0000 mg | DELAYED_RELEASE_TABLET | Freq: Every day | ORAL | Status: DC

## 2017-09-05 MED ORDER — ACETAMINOPHEN 650 MG RE SUPP: 650.0000 mg | Freq: Four times a day (QID) | RECTAL | Status: DC | PRN

## 2017-09-05 MED ORDER — DSS 100 MG PO CAPS: 100.0000 mg | ORAL_CAPSULE | ORAL | Status: DC | PRN

## 2017-09-05 MED ORDER — FOSINOPRIL SODIUM 20 MG PO TABS: 40.0000 mg | ORAL_TABLET | Freq: Every day | ORAL | Status: DC

## 2017-09-05 MED ORDER — CARVEDILOL 3.125 MG PO TABS
3.1250 mg | ORAL_TABLET | Freq: Once | ORAL | Status: AC
  Administered 2017-09-05: 3.125 mg via ORAL
  Filled 2017-09-05: qty 1

## 2017-09-05 MED ORDER — ONDANSETRON HCL 4 MG/2ML IJ SOLN: 4.0000 mg | Freq: Four times a day (QID) | INTRAMUSCULAR | Status: DC | PRN

## 2017-09-05 MED ORDER — PANTOPRAZOLE SODIUM 40 MG IV SOLR
40.0000 mg | Freq: Once | INTRAVENOUS | Status: AC
  Administered 2017-09-05: 40 mg via INTRAVENOUS
  Filled 2017-09-05: qty 40

## 2017-09-05 MED ORDER — SODIUM CHLORIDE 0.9 % IV SOLN: Freq: Once | INTRAVENOUS | Status: DC

## 2017-09-05 NOTE — ED Notes (Signed)
Patient transported to CT 

## 2017-09-05 NOTE — ED Provider Notes (Signed)
[facility] EMERGENCY DEPARTMENT Provider Note   CSN: [id number] Arrival date & time: 09/05/17  4098     History   Chief Complaint No chief complaint on file.   HPI [name] [name] is a 72 y.o. male.  The history is provided by the patient, the spouse and a relative. No language interpreter was used.   [name] [name] is a 72 y.o. male who presents to the Emergency Department complaining of weakness.  Progressive generalized weakness and increased fatigue.  He has dyspnea on exertion and occasional diaphoresis.  He endorsesProgressive weakness over the last 2 weeks.  He endorses nausea with poor appetite black stools intermittently over the last few days.  He saw his PCP yesterday who did a Hemoccult and was told it was positive.  He is planning to follow-up with GI in a week. He also reports hiccups over the last 1.5 weeks. Past Medical History:  Diagnosis Date  . Coronary artery disease    a. 10/2007 s/p PCI of LAD (3.5x23 Vision BMS); b. 05/2008 Cath/PCI: LM nl, LAD 38m, 80 ISR (3.5x28 Promus DES), LCX min irregs, RI nl;  c. 12/2009 MV: EF 61%, no ischemia.  . Diabetes mellitus   . Dyslipidemia   . Hypertension   . Hypertrophic cardiomyopathy (HCC)    a. 03/2014 Echo: EF 60-65%, Gr 1 DD, sev septal basal hypertrophy, no SAM but near mid-cavitary obstruction, mild MR.    Patient Active Problem List   Diagnosis Date Noted  . Palpitations 09/04/2017  . Abnormal EKG 02/06/2016  . Weakness 02/06/2016  . Hypertrophic cardiomyopathy (HCC)   . Dyslipidemia   . Hypertension   . Coronary artery disease   . Type II diabetes mellitus (HCC)   . Inguinal hernia 08/06/2013  . Preoperative cardiovascular examination 07/29/2013  . CAD (coronary artery disease) 02/26/2013  . INSECT BITE 03/07/2009  . ANEMIA, NORMOCYTIC 10/27/2008  . SYNCOPE 08/07/2007  . ECHOCARDIOGRAM, ABNORMAL 08/07/2007  . DIABETES MELLITUS, TYPE II, CONTROLLED 06/16/2007  . ELECTROCARDIOGRAM, ABNORMAL  03/28/2007  . DEGENERATIVE DISC DISEASE 01/01/2007  . ERECTILE DYSFUNCTION 12/27/2006  . Hyperlipidemia 09/06/2006  . Essential hypertension 09/06/2006  . CONSTIPATION 09/06/2006    Past Surgical History:  Procedure Laterality Date  . BACK SURGERY  1992  . CORONARY STENT PLACEMENT  06/03/2008   Promus stent to mid LAD; 10/23/2007 Vision stent to mid LAD  . FEMORAL ENDARTERECTOMY  2010   right, with bovine patch angioplasty;  post cath, r./t aneursym/claudication  . INGUINAL HERNIA REPAIR Left 08/06/2013   Procedure: HERNIA REPAIR INGUINAL ADULT;  Surgeon: [name], MD;  Location: [facility];  Service: General;  Laterality: Left;  . TRANSTHORACIC ECHOCARDIOGRAM  01/12/2010   EF>55%; mild concentric LVH; mild MR, mild TR       Home Medications    Prior to Admission medications   Medication Sig Start Date End Date Taking? Authorizing Provider  aspirin EC 81 MG tablet Take 81 mg by mouth daily.    [provider]  carvedilol (COREG) 3.125 MG tablet TAKE ONE TABLET BY MOUTH TWICE DAILY 02/28/17   Hilty, Lisette Abu, MD  docusate sodium 100 MG CAPS Take 100 mg by mouth daily. Patient taking differently: Take 100 mg by mouth as needed.  08/07/13   Barbaraann Barthel, MD  fosinopril (MONOPRIL) 40 MG tablet Take 40 mg by mouth daily.     [provider]  glipiZIDE (GLUCOTROL) 10 MG tablet Take 10 mg by mouth 2 (two) times daily.  07/19/14   [provider]  metFORMIN (GLUCOPHAGE) 1000 MG tablet Take 1,000 mg by mouth 2 (two) times daily with a meal.      [provider]  Multiple Vitamins-Minerals (MULTIVITAMIN PO) Take by mouth daily.    [provider]  ondansetron (ZOFRAN ODT) 4 MG disintegrating tablet Take 1 tablet (4 mg total) by mouth every 8 (eight) hours as needed for nausea or vomiting. 08/30/17   Long, Arlyss RepressJoshua G, MD  rosuvastatin (CRESTOR) 20 MG tablet Take 1 tablet (20 mg total) by mouth daily. 12/06/16   Hilty, Lisette AbuKenneth C, MD    Family  History Family History  Problem Relation Age of Onset  . Kidney disease Mother   . Heart Problems Mother   . Cancer Brother     Social History Social History   Tobacco Use  . Smoking status: Never Smoker  . Smokeless tobacco: Never Used  Substance Use Topics  . Alcohol use: No  . Drug use: No     Allergies   Patient has no known allergies.   Review of Systems Review of Systems  All other systems reviewed and are negative.    Physical Exam Updated Vital Signs BP 138/66   Pulse 61   Temp 97.7 F (36.5 C) (Oral)   Resp 19   Ht 5' 11.5" (1.816 m)   Wt 66.2 kg (146 lb)   SpO2 97%   BMI 20.08 kg/m   Physical Exam  Constitutional: He is oriented to person, place, and time. He appears well-developed.  thin  HENT:  Head: Normocephalic and atraumatic.  Cardiovascular: Normal rate and regular rhythm.  No murmur heard. Pulmonary/Chest: Effort normal and breath sounds normal. No respiratory distress.  Abdominal: Soft. There is no tenderness. There is no rebound and no guarding.  Genitourinary:  Genitourinary Comments: Enlarged prostate, brown stool  Musculoskeletal: He exhibits no edema or tenderness.  Neurological: He is alert and oriented to person, place, and time.  Skin: Skin is warm and dry. There is pallor.  Psychiatric: He has a normal mood and affect. His behavior is normal.  Nursing note and vitals reviewed.    ED Treatments / Results  Labs (all labs ordered are listed, but only abnormal results are displayed) Labs Reviewed  BASIC METABOLIC PANEL - Abnormal; Notable for the following components:      Result Value   Glucose, Bld 168 (*)    All other components within normal limits  CBC - Abnormal; Notable for the following components:   RBC 3.06 (*)    Hemoglobin 9.0 (*)    HCT 27.2 (*)    All other components within normal limits  URINALYSIS, ROUTINE W REFLEX MICROSCOPIC - Abnormal; Notable for the following components:   Glucose, UA >=500 (*)      Hgb urine dipstick SMALL (*)    All other components within normal limits  HEPATIC FUNCTION PANEL  POC OCCULT BLOOD, ED  I-STAT TROPONIN, ED  TYPE AND SCREEN    EKG  EKG Interpretation  Date/Time:  Thursday September 05 2017 07:35:34 EST Ventricular Rate:  74 PR Interval:  134 QRS Duration: 96 QT Interval:  392 QTC Calculation: 435 R Axis:   -63 Text Interpretation:  Normal sinus rhythm Left axis deviation Nonspecific ST abnormality Abnormal ECG Confirmed by Tilden Fossaees, Margherita Collyer [phone]) on 09/05/2017 10:09:23 AM       Radiology No results found.  Procedures Procedures (including critical care time)  Medications Ordered in ED Medications - No data to  display   Initial Impression / Assessment and Plan / ED Course  I have reviewed the triage vital signs and the nursing notes.  Pertinent labs & imaging results that were available during my care of the patient were reviewed by me and considered in my medical decision making (see chart for details).  Clinical Course as of Sep 05 1536  Thu Sep 05, 2017  1222 D/w Blase Mess with GI  [ER]    Clinical Course User Index [ER] Tilden Fossa, MD    Patient here for evaluation of decreased appetite, progressive weakness for 2 weeks as well as intermittent black and tarry stools at home.  He has heme positive brown stool in the emergency department with drop in hemoglobin compared to a week prior.  Given reports of hiccups and weight loss CT abdomen obtained.  CT with no evidence of mass lesion.  Medicine consulted for admission for weakness with anemia, concern for upper GI bleed.  Discussed with gastroenterology group on call - will see in consult.    Final Clinical Impressions(s) / ED Diagnoses   Final diagnoses:  Asthenia  Anemia, unspecified type    ED Discharge Orders    None       Tilden Fossa, MD 09/05/17 1541

## 2017-09-05 NOTE — H&P (Signed)
History and Physical    Marquest Gunkel ZOX:[id number] DOB: 05/02/1946 DOA: 09/05/2017  PCP: Mirna Mires, MD   Consultants:  GI - called by ED MD Patient coming from: Home - lives with ; [facility]:   Chief Complaint: weakness, dark stools  HPI: [name] [name] is a 72 y.o. male with medical history significant of coronary artery disease with interventions in the past, diabetes mellitus, dyslipidemia, hypertension who presented to the emergency room with complaints of weakness with syncopal episodes, decreased appetite, frequent hiccups over the period of last couple weeks and during the last two days he had  black stools.  ED Course: VS on presentation were stable with temperature 97.7, heart rate 61 respirations 14 blood pressure 119/63 Blood work was notable for normal white blood cell count hemoglobin 9.0 and approximately 10 days ago it was 9.9 and on January 7 13.0 BMP was benign except elevated glucose to 168 EKG showed sinus rhythm with nonspecific ST-T wave changes which are not new Abdominal CT was ordered by ED provider and is still pending   Review of Systems: As per HPI; otherwise review of systems reviewed and negative.   Ambulatory Status:  Ambulates without assistance  Past Medical History:  Diagnosis Date  . Coronary artery disease    a. 10/2007 s/p PCI of LAD (3.5x23 Vision BMS); b. 05/2008 Cath/PCI: LM nl, LAD 37m, 80 ISR (3.5x28 Promus DES), LCX min irregs, RI nl;  c. 12/2009 MV: EF 61%, no ischemia.  . Diabetes mellitus   . Dyslipidemia   . Hypertension   . Hypertrophic cardiomyopathy (HCC)    a. 03/2014 Echo: EF 60-65%, Gr 1 DD, sev septal basal hypertrophy, no SAM but near mid-cavitary obstruction, mild MR.    Past Surgical History:  Procedure Laterality Date  . BACK SURGERY  1992  . CORONARY STENT PLACEMENT  06/03/2008   Promus stent to mid LAD; 10/23/2007 Vision stent to mid LAD  . FEMORAL ENDARTERECTOMY  2010   right, with bovine patch angioplasty;  post cath, r./t  aneursym/claudication  . INGUINAL HERNIA REPAIR Left 08/06/2013   Procedure: HERNIA REPAIR INGUINAL ADULT;  Surgeon: [name], MD;  Location: [facility];  Service: General;  Laterality: Left;  . TRANSTHORACIC ECHOCARDIOGRAM  01/12/2010   EF>55%; mild concentric LVH; mild MR, mild TR    Social History   Socioeconomic History  . Marital status: Married    Spouse name: Not on file  . Number of children: 2  . Years of education: Not on file  . Highest education level: Not on file  Social Needs  . Financial resource strain: Not on file  . Food insecurity - worry: Not on file  . Food insecurity - inability: Not on file  . Transportation needs - medical: Not on file  . Transportation needs - non-medical: Not on file  Occupational History  . Not on file  Tobacco Use  . Smoking status: Never Smoker  . Smokeless tobacco: Never Used  Substance and Sexual Activity  . Alcohol use: No  . Drug use: No  . Sexual activity: Yes    Birth control/protection: None  Other Topics Concern  . Not on file  Social History Narrative  . Not on file    No Known Allergies  Family History  Problem Relation Age of Onset  . Kidney disease Mother   . Heart Problems Mother   . Cancer Brother     Prior to Admission medications   Medication Sig Start Date End Date  Taking? Authorizing Provider  aspirin EC 81 MG tablet Take 81 mg by mouth daily.   Yes [provider]  carvedilol (COREG) 3.125 MG tablet TAKE ONE TABLET BY MOUTH TWICE DAILY Patient taking differently: 3.125mg  by mouth twice daily 02/28/17  Yes Hilty, Lisette AbuKenneth C, MD  docusate sodium 100 MG CAPS Take 100 mg by mouth daily. Patient taking differently: Take 100 mg by mouth as needed (constipation).  08/07/13  Yes Barbaraann BarthelBradford, William, MD  Empagliflozin-Linagliptin (GLYXAMBI) 10-5 MG TABS Take 1 tablet by mouth daily.   Yes [provider]  ferrous sulfate 325 (65 FE) MG tablet Take 325 mg by mouth daily as needed (supplement).    Yes [provider]  fosinopril (MONOPRIL) 40 MG tablet Take 40 mg by mouth daily.    Yes [provider]  metFORMIN (GLUCOPHAGE) 1000 MG tablet Take 1,000 mg by mouth 2 (two) times daily with a meal.     Yes [provider]  Multiple Vitamins-Minerals (MULTIVITAMIN PO) Take by mouth daily.   Yes [provider]  ondansetron (ZOFRAN ODT) 4 MG disintegrating tablet Take 1 tablet (4 mg total) by mouth every 8 (eight) hours as needed for nausea or vomiting. 08/30/17  Yes Long, Arlyss RepressJoshua G, MD  rosuvastatin (CRESTOR) 20 MG tablet Take 1 tablet (20 mg total) by mouth daily. 12/06/16  Yes Chrystie NoseHilty, Kenneth C, MD    Physical Exam: Vitals:   09/05/17 1115 09/05/17 1130 09/05/17 1145 09/05/17 1200  BP: 118/63 135/64 133/67 126/64  Pulse: 65 66 69 68  Resp: 14 20 16 20   Temp:      TempSrc:      SpO2: 99% 100% 100% 100%  Weight:      Height:         General:  Appears calm and comfortable Eyes: PERRLA, sckerae unicteric, EOM intact,  ENT: normal external ear canals, oral mucous membranes moist and intact, no nasal discharge Neck: no lympnadenopathy, masses or thyromegaly Cardiovascular: RRR, no murmurs, gallops or rubs, mild left LE edema. Respiratory: CTA bilaterally, no adventitious sounds auscultated. Normal respiratory effort. Abdomen: soft, NT / ND, BS (+) 4, no masses or organomegaly appreciated Skin: no rash or induration seen on limited exam Musculoskeletal: no joint deformities observed, active full ROM  Psychiatric: grossly normal mood and affect, speech fluent and appropriate Neurologic: CN II-XII grossly normal, no focal deficit visualized, moves all extremities independently, DTR and sensation intact.      Radiological Exams on Admission: Ct Abdomen Pelvis W Contrast  Result Date: 09/05/2017 CLINICAL DATA:  Abdominal distention, weight loss, abdominal pain EXAM: CT ABDOMEN AND PELVIS WITH CONTRAST TECHNIQUE: Multidetector CT imaging of the abdomen  and pelvis was performed using the standard protocol following bolus administration of intravenous contrast. CONTRAST:  100mL ISOVUE-300 IOPAMIDOL (ISOVUE-300) INJECTION 61% COMPARISON:  None. FINDINGS: Lower chest: Stent visualized in the left anterior descending coronary artery. Heart is normal size. Lung bases clear. No effusions. Hepatobiliary: Fatty infiltration of the liver. No focal abnormality or biliary ductal dilatation. Gallbladder unremarkable. Pancreas: No focal abnormality or ductal dilatation. Spleen: No focal abnormality.  Normal size. Adrenals/Urinary Tract: At least 2 small nodules in the left adrenal gland, measuring 20 mm and 16 mm. These are nonspecific, favor adenomas. No focal mass in the right adrenal gland. Small cyst in the midpole of the right kidney. No hydronephrosis or suspicious renal lesion. No stones. Urinary bladder unremarkable. Stomach/Bowel: Moderate stool throughout the colon. Stomach, large and small bowel grossly unremarkable. Vascular/Lymphatic: Aortic atherosclerosis.  No enlarged abdominal or pelvic lymph nodes. Reproductive: No visible focal abnormality. Other: No free fluid or free air. Musculoskeletal: No acute bony abnormality. Degenerative changes in the lumbar spine. IMPRESSION: Two small left adrenal nodules which cannot be characterized on this enhanced CT. These could be further evaluated with noncontrast CT or MRI. Mild fatty infiltration of the liver. Moderate stool burden in the colon. Aortoiliac atherosclerosis. No acute findings in the abdomen or pelvis. Electronically Signed   By: Charlett Nose M.D.   On: 09/05/2017 11:05    EKG: Independently reviewed.  NSR with rate ; nonspecific ST changes that are not new  Labs on Admission: I have personally reviewed the available labs and imaging studies at the time of the admission.  Pertinent labs: Hgb 9.0 today and 13.0 on 07/24/17    Assessment/Plan Principal Problem:   GIB (gastrointestinal  bleeding) Active Problems:   Hyperlipidemia   Essential hypertension   Type II diabetes mellitus (HCC)   Abnormal EKG   GIB - patient has positive fecal occult blood  Will continue IV Protonix GI  Consult requested by ED provider, will start clear ;liquid diet and keep patient NPO for possible endoscopy tomorrow  Blood loss anemia - Hgb is 9.0 and 13.0 on 07/29/17 Continue to monitor [facility] Currently patient is hemodynamically stable, free of chest and SOB Continue to monitor and transfuse if needed  Hyperlipidemia - most recent lipid profile was in 2010 in EPIC Restart Crestor after patient allowed to eat regular diet  DM type II - no recent hemoglobin A1c on file Continue diabetic diet, monitor FSBS, check hemoglobin A1c add sliding scale insulin  Hypertension - currently stable Continue home medication and adjust the doses if needed depending on the BP readings  DVT prophylaxis: SCDs Code Status: Full - confirmed with patient/family Family Communication: at bedside Disposition Plan: Home once clinically improved Consults called: GI Admission status: observation    Raymon Mutton PA-C [phone] [facility]  If note is complete, please contact covering daytime or nighttime physician. [email or web address]  Password [email or web address]  09/05/2017, 12:20 PM

## 2017-09-05 NOTE — H&P (View-Only) (Signed)
[facility] Consult: 2:03 PM 09/05/2017  LOS: 0 days    Referring Provider: Dr Ophelia Charter  Primary Care Physician:  Mirna Mires, MD Primary Gastroenterologist:   Dorena Cookey in 2004   Reason for Consultation:  Anemia and dark, FOBT + stool.      HPI: [name] [name] is a 72 y.o. male.  PMH NIDDM.  Htn.  CAD. S/p cardiac DES 2009.  Takes 81 mg ASA, not on Plavix etc. hypertrophic cardiomyopathy.  S/p 2010 right femoral endarterectomy/angioplasty to address post heart cath dissection of right CFA with claudication.  S/p 2015 Left Inguinal hernia repair without mesh.   BPH.  05/2003 screening colonoscopy.  Normal study.     S/p cataract surgery 08/29/17. Was seen in the emergency department 08/30/17 complaining of a week's worth of fatigue.  Anorexia.  There was nausea but no vomiting, no diarrhea.  Hgb was 9.9.  Serum glucose 248.  Troponin normal.  BUN elevated at 36 with normal creatinine 1.0.  He was treated with a half a liter of fluids.  He was discharged with a prescription for as needed Zofran.  He did not need to take the Zofran though he had some continued mild queasiness.  Wife reports anorexia, poor p.o. intake and early satiety for a few months.  He is probably lost about 10 pounds.  No dysphagia.  No abdominal pain.  Stools were brown until Monday or Tuesday of this week when he started noticing his stools were a little bit looser and darker in color.  He went to see Dr. Loleta Chance yesterday and was prescribed oral iron because of the anemia.  He has had regular fecal occult blood testing and this is been negative.  Previously he never been told he was anemic.  No alcohol intake, no significant history of alcohol intake.  Other than 81 mg aspirin, does not use aspirin or nonsteroidal products.  Due to the progression of  fatigue to the point where it is hard for him to walk around his house or to perform simple ADLs, he came to the [facility].  At 730 this morning Hgb 9, MCV 88.  Check Hgb at 130 this afternoon with Hgb 7.8 (not clear id this is Serum assay of finger stick POC assay).  Previous Hgb from 07/2014 was 13.   FOBT positive.  Normal CT abdomen pelvis with contrast: Small left adrenal nodules, mild fatty liver, moderate colonic stool burden.  Aortic atherosclerosis.  + colon cancer hx: brother, in his 11s.     Past Medical History:  Diagnosis Date  . Coronary artery disease    a. 10/2007 s/p PCI of LAD (3.5x23 Vision BMS); b. 05/2008 Cath/PCI: LM nl, LAD 87m, 80 ISR (3.5x28 Promus DES), LCX min irregs, RI nl;  c. 12/2009 MV: EF 61%, no ischemia.  . Diabetes mellitus   . Dyslipidemia   . Hypertension   . Hypertrophic cardiomyopathy (HCC)    a. 03/2014 Echo: EF 60-65%, Gr 1 DD, sev septal basal hypertrophy, no SAM but near mid-cavitary obstruction, mild MR.  Past Surgical History:  Procedure Laterality Date  . BACK SURGERY  1992  . CORONARY STENT PLACEMENT  06/03/2008   Promus stent to mid LAD; 10/23/2007 Vision stent to mid LAD  . FEMORAL ENDARTERECTOMY  2010   right, with bovine patch angioplasty;  post cath, r./t aneursym/claudication  . INGUINAL HERNIA REPAIR Left 08/06/2013   Procedure: HERNIA REPAIR INGUINAL ADULT;  Surgeon: Marlane HatcherWilliam S Bradford, MD;  Location: [facility];  Service: General;  Laterality: Left;  . TRANSTHORACIC ECHOCARDIOGRAM  01/12/2010   EF>55%; mild concentric LVH; mild MR, mild TR    Prior to Admission medications   Medication Sig Start Date End Date Taking? Authorizing Provider  aspirin EC 81 MG tablet Take 81 mg by mouth daily.   Yes [provider]  carvedilol (COREG) 3.125 MG tablet TAKE ONE TABLET BY MOUTH TWICE DAILY Patient taking differently: 3.125mg  by mouth twice daily 02/28/17  Yes Hilty, Lisette AbuKenneth C, MD  docusate sodium 100 MG CAPS Take 100 mg by mouth  daily. Patient taking differently: Take 100 mg by mouth as needed (constipation).  08/07/13  Yes Barbaraann BarthelBradford, William, MD  Empagliflozin-Linagliptin (GLYXAMBI) 10-5 MG TABS Take 1 tablet by mouth daily.   Yes [provider]  ferrous sulfate 325 (65 FE) MG tablet Take 325 mg by mouth daily as needed (supplement).   Yes [provider]  fosinopril (MONOPRIL) 40 MG tablet Take 40 mg by mouth daily.    Yes [provider]  metFORMIN (GLUCOPHAGE) 1000 MG tablet Take 1,000 mg by mouth 2 (two) times daily with a meal.     Yes [provider]  Multiple Vitamins-Minerals (MULTIVITAMIN PO) Take by mouth daily.   Yes [provider]  ondansetron (ZOFRAN ODT) 4 MG disintegrating tablet Take 1 tablet (4 mg total) by mouth every 8 (eight) hours as needed for nausea or vomiting. 08/30/17  Yes Long, Arlyss RepressJoshua G, MD  rosuvastatin (CRESTOR) 20 MG tablet Take 1 tablet (20 mg total) by mouth daily. 12/06/16  Yes Hilty, Lisette AbuKenneth C, MD    Scheduled Meds: . carvedilol  3.125 mg Oral BID  . fosinopril  40 mg Oral Daily   Infusions: . sodium chloride     PRN Meds: acetaminophen **OR** acetaminophen, DSS, ferrous sulfate, ondansetron **OR** ondansetron (ZOFRAN) IV, traZODone   Allergies as of 09/05/2017  . (No Known Allergies)    Family History  Problem Relation Age of Onset  . Kidney disease Mother   . Heart Problems Mother   . Cancer Brother     Social History   Socioeconomic History  . Marital status: Married    Spouse name: Not on file  . Number of children: 2  . Years of education: Not on file  . Highest education level: Not on file  Social Needs  . Financial resource strain: Not on file  . Food insecurity - worry: Not on file  . Food insecurity - inability: Not on file  . Transportation needs - medical: Not on file  . Transportation needs - non-medical: Not on file  Occupational History  . Not on file  Tobacco Use  . Smoking status: Never Smoker  .  Smokeless tobacco: Never Used  Substance and Sexual Activity  . Alcohol use: No  . Drug use: No  . Sexual activity: Yes    Birth control/protection: None  Other Topics Concern  . Not on file  Social History Narrative  . Not on file    REVIEW OF SYSTEMS: Constitutional: Weakness, fatigue. ENT:  No nose bleeds Pulm: Dyspnea on exertion.  No cough.  No dyspnea at rest. CV:  No palpitations, no chest pain.  Long-standing chronic left lower extremity edema. GU:  No hematuria, no frequency GI:  Per HPI Heme:  Per HPI   Transfusions:  none Neuro:  Some lightheadedness.  No headaches, no peripheral tingling or numbness Derm:  No itching, no rash or sores.  Endocrine:  No sweats or chills.  No polyuria or dysuria Immunization:  Not queried.   Travel:  None beyond local counties in last few months.    PHYSICAL EXAM: Vital signs in last 24 hours: Vitals:   09/05/17 1145 09/05/17 1200  BP: 133/67 126/64  Pulse: 69 68  Resp: 16 20  Temp:    SpO2: 100% 100%   Wt Readings from Last 3 Encounters:  09/05/17 66.2 kg (146 lb)  09/02/17 66.2 kg (146 lb)  12/06/16 69.6 kg (153 lb 6.4 oz)    General: Pleasant, elderly AAM.  He looks somewhat frail and somewhat chronically ill.  He is awake and alert.  Comfortable. Head: No facial swelling or asymmetry.  No signs of head trauma. Eyes: No scleral icterus.  No conjunctival pallor. Ears: Not hard of hearing. Nose: No discharge or congestion Mouth: Tongue midline.  Oral mucosa pink, moist, clear.  Smile symmetric. Neck: No JVD, no thyromegaly, no masses. Lungs: Clear bilaterally.  No adventitious sounds.  No dyspnea.  No cough. Heart: RRR.  No MRG.  S1, S2 present. Abdomen: Soft.  Not tender or distended.  No masses.  No organomegaly.  No hernias.  Bowel sounds active..   Rectal: Performed by ED physician, stool dark brown and FOBT positive but not melenic Musc/Skeltl: No joint swelling, redness or significant deformity. Extremities:  1-2+ edema of the left foot/ankle. Neurologic: Oriented x3.  Fully alert.  Moves all 4 limbs, strength not tested.  No tremor.  No gross deficits. Skin: No open sores.  No rashes or suspicious lesions. Tattoos: None observed. Nodes: No inguinal or cervical adenopathy. Psych: Cooperative, calm, pleasant, fluid speech.  Intake/Output from previous day: No intake/output data recorded. Intake/Output this shift: No intake/output data recorded.  LAB RESULTS: Recent Labs    09/05/17 0729 09/05/17 1330  WBC 5.4  --   HGB 9.0* 7.8*  HCT 27.2* 23.6*  PLT 359  --    BMET Lab Results  Component Value Date   NA 136 09/05/2017   NA 135 08/30/2017   NA 138 07/29/2014   K 4.2 09/05/2017   K 4.6 08/30/2017   K 5.2 (H) 07/29/2014   CL 102 09/05/2017   CL 99 (L) 08/30/2017   CL 104 07/29/2014   CO2 23 09/05/2017   CO2 24 08/30/2017   CO2 24 07/29/2014   GLUCOSE 168 (H) 09/05/2017   GLUCOSE 248 (H) 08/30/2017   GLUCOSE 244 (H) 07/29/2014   BUN 14 09/05/2017   BUN 36 (H) 08/30/2017   BUN 10 07/29/2014   CREATININE 0.89 09/05/2017   CREATININE 1.01 08/30/2017   CREATININE 1.03 07/29/2014   CALCIUM 9.2 09/05/2017   CALCIUM 9.7 08/30/2017   CALCIUM 9.1 07/29/2014   LFT Recent Labs    09/05/17 0729  PROT 6.6  ALBUMIN 3.8  AST 17  ALT 14*  ALKPHOS 57  BILITOT 0.8  BILIDIR <0.1*  IBILI NOT CALCULATED   PT/INR Lab Results  Component Value Date   INR 1.1 08/24/2008    RADIOLOGY STUDIES: Ct Abdomen Pelvis W Contrast  Result Date:  09/05/2017 CLINICAL DATA:  Abdominal distention, weight loss, abdominal pain EXAM: CT ABDOMEN AND PELVIS WITH CONTRAST TECHNIQUE: Multidetector CT imaging of the abdomen and pelvis was performed using the standard protocol following bolus administration of intravenous contrast. CONTRAST:  ISOVUE-300 IOPAMIDOL (ISOVUE-300) INJECTION 61% COMPARISON:  None. FINDINGS: Lower chest: Stent visualized in the left anterior descending coronary artery.  Heart is normal size. Lung bases clear. No effusions. Hepatobiliary: Fatty infiltration of the liver. No focal abnormality or biliary ductal dilatation. Gallbladder unremarkable. Pancreas: No focal abnormality or ductal dilatation. Spleen: No focal abnormality.  Normal size. Adrenals/Urinary Tract: At least 2 small nodules in the left adrenal gland, measuring 20 mm and 16 mm. These are nonspecific, favor adenomas. No focal mass in the right adrenal gland. Small cyst in the midpole of the right kidney. No hydronephrosis or suspicious renal lesion. No stones. Urinary bladder unremarkable. Stomach/Bowel: Moderate stool throughout the colon. Stomach, large and small bowel grossly unremarkable. Vascular/Lymphatic: Aortic atherosclerosis. No enlarged abdominal or pelvic lymph nodes. Reproductive: No visible focal abnormality. Other: No free fluid or free air. Musculoskeletal: No acute bony abnormality. Degenerative changes in the lumbar spine. IMPRESSION: Two small left adrenal nodules which cannot be characterized on this enhanced CT. These could be further evaluated with noncontrast CT or MRI. Mild fatty infiltration of the liver. Moderate stool burden in the colon. Aortoiliac atherosclerosis. No acute findings in the abdomen or pelvis. Electronically Signed   By: Charlett Nose M.D.   On: 09/05/2017 11:05     IMPRESSION:   *   FOBT positive anemia. Dark but not melenic stool.  Symptomatic with fatigue and weakness.  BUN was elevated last week, not now.   Normal screening colonoscopy in 2004.  Family history positive for colon cancer which killed his brother in his early to mid 65s. So far no orders for blood transfusion.  The Hgb of 7.8 may have been a POC lab, not serum, so want to recheck Hgb early this PM.    *   NIDDM.     *   History CAD.  Drug-eluting stent placed 2009.  No longer on Plavix but does take daily 81 mg aspirin.  Troponins normal 1 week ago and today.  No chest pain.  PLAN:     *  EGD  set for 130 PM tomorrow.  Hold off on colonoscopy for now.   Regular diet tonight, NPO after 5 AM tomorrow.   May need transfusion with PRBC but for now ordering Hgb/hct for 6 PM tonite.  Stop po iron, it will confuse picture of dark stools and will not benefit pt in near term.   Empiric oral protonix, got 1 dose IV protonix this AM.  .      Jennye Moccasin  09/05/2017, 2:03 PM Pager: [phone]    [facility] Attending   I have taken an interval history, reviewed the chart and examined the patient. I agree with the Advanced Practitioner's note, impression and recommendations.    Iva Boop, MD, [facility] [phone] (pager) 09/05/2017 7:38 PM

## 2017-09-05 NOTE — ED Triage Notes (Signed)
Patient complains of ongoing weakness x 2-3 weeks. States that he had syncopal episode x 2 over the weekend and [facility]. Son reports that he has also had dark stools x 2-3 days. Denies pain, alert and oriented, ambulatory

## 2017-09-05 NOTE — Consult Note (Signed)
[facility] Consult: 2:03 PM 09/05/2017  LOS: 0 days    Referring Provider: Dr Ophelia Charter  Primary Care Physician:  Mirna Mires, MD Primary Gastroenterologist:   Dorena Cookey in 2004   Reason for Consultation:  Anemia and dark, FOBT + stool.      HPI: [name] [name] is a 72 y.o. male.  PMH NIDDM.  Htn.  CAD. S/p cardiac DES 2009.  Takes 81 mg ASA, not on Plavix etc. hypertrophic cardiomyopathy.  S/p 2010 right femoral endarterectomy/angioplasty to address post heart cath dissection of right CFA with claudication.  S/p 2015 Left Inguinal hernia repair without mesh.   BPH.  05/2003 screening colonoscopy.  Normal study.     S/p cataract surgery 08/29/17. Was seen in the emergency department 08/30/17 complaining of a week's worth of fatigue.  Anorexia.  There was nausea but no vomiting, no diarrhea.  Hgb was 9.9.  Serum glucose 248.  Troponin normal.  BUN elevated at 36 with normal creatinine 1.0.  He was treated with a half a liter of fluids.  He was discharged with a prescription for as needed Zofran.  He did not need to take the Zofran though he had some continued mild queasiness.  Wife reports anorexia, poor p.o. intake and early satiety for a few months.  He is probably lost about 10 pounds.  No dysphagia.  No abdominal pain.  Stools were brown until Monday or Tuesday of this week when he started noticing his stools were a little bit looser and darker in color.  He went to see Dr. Loleta Chance yesterday and was prescribed oral iron because of the anemia.  He has had regular fecal occult blood testing and this is been negative.  Previously he never been told he was anemic.  No alcohol intake, no significant history of alcohol intake.  Other than 81 mg aspirin, does not use aspirin or nonsteroidal products.  Due to the progression of  fatigue to the point where it is hard for him to walk around his house or to perform simple ADLs, he came to the [facility].  At 730 this morning Hgb 9, MCV 88.  Check Hgb at 130 this afternoon with Hgb 7.8 (not clear id this is Serum assay of finger stick POC assay).  Previous Hgb from 07/2014 was 13.   FOBT positive.  Normal CT abdomen pelvis with contrast: Small left adrenal nodules, mild fatty liver, moderate colonic stool burden.  Aortic atherosclerosis.  + colon cancer hx: brother, in his 11s.     Past Medical History:  Diagnosis Date  . Coronary artery disease    a. 10/2007 s/p PCI of LAD (3.5x23 Vision BMS); b. 05/2008 Cath/PCI: LM nl, LAD 87m, 80 ISR (3.5x28 Promus DES), LCX min irregs, RI nl;  c. 12/2009 MV: EF 61%, no ischemia.  . Diabetes mellitus   . Dyslipidemia   . Hypertension   . Hypertrophic cardiomyopathy (HCC)    a. 03/2014 Echo: EF 60-65%, Gr 1 DD, sev septal basal hypertrophy, no SAM but near mid-cavitary obstruction, mild MR.  Past Surgical History:  Procedure Laterality Date  . BACK SURGERY  1992  . CORONARY STENT PLACEMENT  06/03/2008   Promus stent to mid LAD; 10/23/2007 Vision stent to mid LAD  . FEMORAL ENDARTERECTOMY  2010   right, with bovine patch angioplasty;  post cath, r./t aneursym/claudication  . INGUINAL HERNIA REPAIR Left 08/06/2013   Procedure: HERNIA REPAIR INGUINAL ADULT;  Surgeon: Marlane HatcherWilliam S Bradford, MD;  Location: [facility];  Service: General;  Laterality: Left;  . TRANSTHORACIC ECHOCARDIOGRAM  01/12/2010   EF>55%; mild concentric LVH; mild MR, mild TR    Prior to Admission medications   Medication Sig Start Date End Date Taking? Authorizing Provider  aspirin EC 81 MG tablet Take 81 mg by mouth daily.   Yes [provider]  carvedilol (COREG) 3.125 MG tablet TAKE ONE TABLET BY MOUTH TWICE DAILY Patient taking differently: 3.125mg  by mouth twice daily 02/28/17  Yes Hilty, Lisette AbuKenneth C, MD  docusate sodium 100 MG CAPS Take 100 mg by mouth  daily. Patient taking differently: Take 100 mg by mouth as needed (constipation).  08/07/13  Yes Barbaraann BarthelBradford, William, MD  Empagliflozin-Linagliptin (GLYXAMBI) 10-5 MG TABS Take 1 tablet by mouth daily.   Yes [provider]  ferrous sulfate 325 (65 FE) MG tablet Take 325 mg by mouth daily as needed (supplement).   Yes [provider]  fosinopril (MONOPRIL) 40 MG tablet Take 40 mg by mouth daily.    Yes [provider]  metFORMIN (GLUCOPHAGE) 1000 MG tablet Take 1,000 mg by mouth 2 (two) times daily with a meal.     Yes [provider]  Multiple Vitamins-Minerals (MULTIVITAMIN PO) Take by mouth daily.   Yes [provider]  ondansetron (ZOFRAN ODT) 4 MG disintegrating tablet Take 1 tablet (4 mg total) by mouth every 8 (eight) hours as needed for nausea or vomiting. 08/30/17  Yes Long, Arlyss RepressJoshua G, MD  rosuvastatin (CRESTOR) 20 MG tablet Take 1 tablet (20 mg total) by mouth daily. 12/06/16  Yes Hilty, Lisette AbuKenneth C, MD    Scheduled Meds: . carvedilol  3.125 mg Oral BID  . fosinopril  40 mg Oral Daily   Infusions: . sodium chloride     PRN Meds: acetaminophen **OR** acetaminophen, DSS, ferrous sulfate, ondansetron **OR** ondansetron (ZOFRAN) IV, traZODone   Allergies as of 09/05/2017  . (No Known Allergies)    Family History  Problem Relation Age of Onset  . Kidney disease Mother   . Heart Problems Mother   . Cancer Brother     Social History   Socioeconomic History  . Marital status: Married    Spouse name: Not on file  . Number of children: 2  . Years of education: Not on file  . Highest education level: Not on file  Social Needs  . Financial resource strain: Not on file  . Food insecurity - worry: Not on file  . Food insecurity - inability: Not on file  . Transportation needs - medical: Not on file  . Transportation needs - non-medical: Not on file  Occupational History  . Not on file  Tobacco Use  . Smoking status: Never Smoker  .  Smokeless tobacco: Never Used  Substance and Sexual Activity  . Alcohol use: No  . Drug use: No  . Sexual activity: Yes    Birth control/protection: None  Other Topics Concern  . Not on file  Social History Narrative  . Not on file    REVIEW OF SYSTEMS: Constitutional: Weakness, fatigue. ENT:  No nose bleeds Pulm: Dyspnea on exertion.  No cough.  No dyspnea at rest. CV:  No palpitations, no chest pain.  Long-standing chronic left lower extremity edema. GU:  No hematuria, no frequency GI:  Per HPI Heme:  Per HPI   Transfusions:  none Neuro:  Some lightheadedness.  No headaches, no peripheral tingling or numbness Derm:  No itching, no rash or sores.  Endocrine:  No sweats or chills.  No polyuria or dysuria Immunization:  Not queried.   Travel:  None beyond local counties in last few months.    PHYSICAL EXAM: Vital signs in last 24 hours: Vitals:   09/05/17 1145 09/05/17 1200  BP: 133/67 126/64  Pulse: 69 68  Resp: 16 20  Temp:    SpO2: 100% 100%   Wt Readings from Last 3 Encounters:  09/05/17 66.2 kg (146 lb)  09/02/17 66.2 kg (146 lb)  12/06/16 69.6 kg (153 lb 6.4 oz)    General: Pleasant, elderly AAM.  He looks somewhat frail and somewhat chronically ill.  He is awake and alert.  Comfortable. Head: No facial swelling or asymmetry.  No signs of head trauma. Eyes: No scleral icterus.  No conjunctival pallor. Ears: Not hard of hearing. Nose: No discharge or congestion Mouth: Tongue midline.  Oral mucosa pink, moist, clear.  Smile symmetric. Neck: No JVD, no thyromegaly, no masses. Lungs: Clear bilaterally.  No adventitious sounds.  No dyspnea.  No cough. Heart: RRR.  No MRG.  S1, S2 present. Abdomen: Soft.  Not tender or distended.  No masses.  No organomegaly.  No hernias.  Bowel sounds active..   Rectal: Performed by ED physician, stool dark brown and FOBT positive but not melenic Musc/Skeltl: No joint swelling, redness or significant deformity. Extremities:  1-2+ edema of the left foot/ankle. Neurologic: Oriented x3.  Fully alert.  Moves all 4 limbs, strength not tested.  No tremor.  No gross deficits. Skin: No open sores.  No rashes or suspicious lesions. Tattoos: None observed. Nodes: No inguinal or cervical adenopathy. Psych: Cooperative, calm, pleasant, fluid speech.  Intake/Output from previous day: No intake/output data recorded. Intake/Output this shift: No intake/output data recorded.  LAB RESULTS: Recent Labs    09/05/17 0729 09/05/17 1330  WBC 5.4  --   HGB 9.0* 7.8*  HCT 27.2* 23.6*  PLT 359  --    BMET Lab Results  Component Value Date   NA 136 09/05/2017   NA 135 08/30/2017   NA 138 07/29/2014   K 4.2 09/05/2017   K 4.6 08/30/2017   K 5.2 (H) 07/29/2014   CL 102 09/05/2017   CL 99 (L) 08/30/2017   CL 104 07/29/2014   CO2 23 09/05/2017   CO2 24 08/30/2017   CO2 24 07/29/2014   GLUCOSE 168 (H) 09/05/2017   GLUCOSE 248 (H) 08/30/2017   GLUCOSE 244 (H) 07/29/2014   BUN 14 09/05/2017   BUN 36 (H) 08/30/2017   BUN 10 07/29/2014   CREATININE 0.89 09/05/2017   CREATININE 1.01 08/30/2017   CREATININE 1.03 07/29/2014   CALCIUM 9.2 09/05/2017   CALCIUM 9.7 08/30/2017   CALCIUM 9.1 07/29/2014   LFT Recent Labs    09/05/17 0729  PROT 6.6  ALBUMIN 3.8  AST 17  ALT 14*  ALKPHOS 57  BILITOT 0.8  BILIDIR <0.1*  IBILI NOT CALCULATED   PT/INR Lab Results  Component Value Date   INR 1.1 08/24/2008    RADIOLOGY STUDIES: Ct Abdomen Pelvis W Contrast  Result Date:  09/05/2017 CLINICAL DATA:  Abdominal distention, weight loss, abdominal pain EXAM: CT ABDOMEN AND PELVIS WITH CONTRAST TECHNIQUE: Multidetector CT imaging of the abdomen and pelvis was performed using the standard protocol following bolus administration of intravenous contrast. CONTRAST:  ISOVUE-300 IOPAMIDOL (ISOVUE-300) INJECTION 61% COMPARISON:  None. FINDINGS: Lower chest: Stent visualized in the left anterior descending coronary artery.  Heart is normal size. Lung bases clear. No effusions. Hepatobiliary: Fatty infiltration of the liver. No focal abnormality or biliary ductal dilatation. Gallbladder unremarkable. Pancreas: No focal abnormality or ductal dilatation. Spleen: No focal abnormality.  Normal size. Adrenals/Urinary Tract: At least 2 small nodules in the left adrenal gland, measuring 20 mm and 16 mm. These are nonspecific, favor adenomas. No focal mass in the right adrenal gland. Small cyst in the midpole of the right kidney. No hydronephrosis or suspicious renal lesion. No stones. Urinary bladder unremarkable. Stomach/Bowel: Moderate stool throughout the colon. Stomach, large and small bowel grossly unremarkable. Vascular/Lymphatic: Aortic atherosclerosis. No enlarged abdominal or pelvic lymph nodes. Reproductive: No visible focal abnormality. Other: No free fluid or free air. Musculoskeletal: No acute bony abnormality. Degenerative changes in the lumbar spine. IMPRESSION: Two small left adrenal nodules which cannot be characterized on this enhanced CT. These could be further evaluated with noncontrast CT or MRI. Mild fatty infiltration of the liver. Moderate stool burden in the colon. Aortoiliac atherosclerosis. No acute findings in the abdomen or pelvis. Electronically Signed   By: Charlett Nose M.D.   On: 09/05/2017 11:05     IMPRESSION:   *   FOBT positive anemia. Dark but not melenic stool.  Symptomatic with fatigue and weakness.  BUN was elevated last week, not now.   Normal screening colonoscopy in 2004.  Family history positive for colon cancer which killed his brother in his early to mid 65s. So far no orders for blood transfusion.  The Hgb of 7.8 may have been a POC lab, not serum, so want to recheck Hgb early this PM.    *   NIDDM.     *   History CAD.  Drug-eluting stent placed 2009.  No longer on Plavix but does take daily 81 mg aspirin.  Troponins normal 1 week ago and today.  No chest pain.  PLAN:     *  EGD  set for 130 PM tomorrow.  Hold off on colonoscopy for now.   Regular diet tonight, NPO after 5 AM tomorrow.   May need transfusion with PRBC but for now ordering Hgb/hct for 6 PM tonite.  Stop po iron, it will confuse picture of dark stools and will not benefit pt in near term.   Empiric oral protonix, got 1 dose IV protonix this AM.  .      Jennye Moccasin  09/05/2017, 2:03 PM Pager: [phone]    [facility] Attending   I have taken an interval history, reviewed the chart and examined the patient. I agree with the Advanced Practitioner's note, impression and recommendations.    Iva Boop, MD, [facility] [phone] (pager) 09/05/2017 7:38 PM

## 2017-09-05 NOTE — ED Notes (Signed)
Report attempted x 1

## 2017-09-06 ENCOUNTER — Observation Stay (HOSPITAL_COMMUNITY): Payer: Medicare Other | Admitting: Anesthesiology

## 2017-09-06 ENCOUNTER — Encounter (HOSPITAL_COMMUNITY)
Admission: EM | Disposition: A | Discharge status: Home / Self Care | Payer: Self-pay | Source: Home / Self Care | Attending: Emergency Medicine

## 2017-09-06 ENCOUNTER — Other Ambulatory Visit (HOSPITAL_COMMUNITY): Payer: Medicare Other

## 2017-09-06 ENCOUNTER — Encounter (HOSPITAL_COMMUNITY): Payer: Self-pay | Admitting: Critical Care Medicine

## 2017-09-06 DIAGNOSIS — K259 Gastric ulcer, unspecified as acute or chronic, without hemorrhage or perforation: Secondary | ICD-10-CM | POA: Diagnosis not present

## 2017-09-06 DIAGNOSIS — K922 Gastrointestinal hemorrhage, unspecified: Secondary | ICD-10-CM | POA: Diagnosis not present

## 2017-09-06 DIAGNOSIS — I2510 Atherosclerotic heart disease of native coronary artery without angina pectoris: Secondary | ICD-10-CM | POA: Diagnosis not present

## 2017-09-06 DIAGNOSIS — K2970 Gastritis, unspecified, without bleeding: Secondary | ICD-10-CM

## 2017-09-06 DIAGNOSIS — K250 Acute gastric ulcer with hemorrhage: Secondary | ICD-10-CM

## 2017-09-06 DIAGNOSIS — I422 Other hypertrophic cardiomyopathy: Secondary | ICD-10-CM | POA: Diagnosis not present

## 2017-09-06 DIAGNOSIS — R531 Weakness: Secondary | ICD-10-CM | POA: Diagnosis not present

## 2017-09-06 DIAGNOSIS — K254 Chronic or unspecified gastric ulcer with hemorrhage: Secondary | ICD-10-CM | POA: Diagnosis not present

## 2017-09-06 DIAGNOSIS — D649 Anemia, unspecified: Secondary | ICD-10-CM | POA: Diagnosis not present

## 2017-09-06 DIAGNOSIS — K921 Melena: Secondary | ICD-10-CM | POA: Diagnosis not present

## 2017-09-06 DIAGNOSIS — E119 Type 2 diabetes mellitus without complications: Secondary | ICD-10-CM

## 2017-09-06 DIAGNOSIS — K2951 Unspecified chronic gastritis with bleeding: Secondary | ICD-10-CM | POA: Diagnosis not present

## 2017-09-06 DIAGNOSIS — K269 Duodenal ulcer, unspecified as acute or chronic, without hemorrhage or perforation: Secondary | ICD-10-CM | POA: Diagnosis not present

## 2017-09-06 DIAGNOSIS — D62 Acute posthemorrhagic anemia: Secondary | ICD-10-CM | POA: Diagnosis not present

## 2017-09-06 DIAGNOSIS — I10 Essential (primary) hypertension: Secondary | ICD-10-CM | POA: Diagnosis not present

## 2017-09-06 DIAGNOSIS — E782 Mixed hyperlipidemia: Secondary | ICD-10-CM | POA: Diagnosis not present

## 2017-09-06 DIAGNOSIS — D500 Iron deficiency anemia secondary to blood loss (chronic): Secondary | ICD-10-CM | POA: Diagnosis not present

## 2017-09-06 DIAGNOSIS — R195 Other fecal abnormalities: Secondary | ICD-10-CM | POA: Diagnosis not present

## 2017-09-06 HISTORY — PX: ESOPHAGOGASTRODUODENOSCOPY: SHX5428

## 2017-09-06 LAB — CBC
HCT: 34.6 % — ABNORMAL LOW (ref 39.0–52.0)
Hemoglobin: 11.6 g/dL — ABNORMAL LOW (ref 13.0–17.0)
MCH: 28 pg (ref 26.0–34.0)
MCHC: 33.5 g/dL (ref 30.0–36.0)
MCV: 83.6 fL (ref 78.0–100.0)
PLATELETS: 293 10*3/uL (ref 150–400)
RBC: 4.14 MIL/uL — ABNORMAL LOW (ref 4.22–5.81)
RDW: 14.7 % (ref 11.5–15.5)
WBC: 5.4 10*3/uL (ref 4.0–10.5)

## 2017-09-06 LAB — BASIC METABOLIC PANEL
ANION GAP: 11 (ref 5–15)
BUN: 13 mg/dL (ref 6–20)
CALCIUM: 9.1 mg/dL (ref 8.9–10.3)
CO2: 24 mmol/L (ref 22–32)
Chloride: 103 mmol/L (ref 101–111)
Creatinine, Ser: 0.91 mg/dL (ref 0.61–1.24)
GFR calc Af Amer: >60 mL/min (ref >60)
Glucose, Bld: 168 mg/dL — ABNORMAL HIGH (ref 65–99)
POTASSIUM: 4.4 mmol/L (ref 3.5–5.1)
SODIUM: 138 mmol/L (ref 135–145)

## 2017-09-06 LAB — GLUCOSE, CAPILLARY: GLUCOSE-CAPILLARY: 151 mg/dL — AB (ref 65–99)

## 2017-09-06 SURGERY — EGD (ESOPHAGOGASTRODUODENOSCOPY)
Anesthesia: Monitor Anesthesia Care

## 2017-09-06 MED ORDER — PANTOPRAZOLE SODIUM 40 MG PO TBEC: 40.0000 mg | DELAYED_RELEASE_TABLET | Freq: Every day | ORAL | 1 refills | Status: DC

## 2017-09-06 MED ORDER — LACTATED RINGERS IV SOLN
INTRAVENOUS | Status: AC | PRN
  Administered 2017-09-06: 1000 mL via INTRAVENOUS
  Administered 2017-09-06: 14:00:00 via INTRAVENOUS

## 2017-09-06 MED ORDER — FERROUS SULFATE 325 (65 FE) MG PO TABS: 325.0000 mg | ORAL_TABLET | Freq: Two times a day (BID) | ORAL | 1 refills | Status: AC

## 2017-09-06 MED ORDER — PROPOFOL 10 MG/ML IV BOLUS
INTRAVENOUS | Status: DC | PRN
  Administered 2017-09-06: 20 mg via INTRAVENOUS

## 2017-09-06 MED ORDER — PANTOPRAZOLE SODIUM 40 MG PO TBEC: 40.0000 mg | DELAYED_RELEASE_TABLET | Freq: Every day | ORAL | Status: DC

## 2017-09-06 MED ORDER — ASPIRIN EC 81 MG PO TBEC: 81.0000 mg | DELAYED_RELEASE_TABLET | Freq: Every day | ORAL | Status: DC

## 2017-09-06 MED ORDER — PROPOFOL 500 MG/50ML IV EMUL
INTRAVENOUS | Status: DC | PRN
  Administered 2017-09-06: 200 ug/kg/min via INTRAVENOUS

## 2017-09-06 NOTE — Op Note (Signed)
[facility] Patient Name: [name] [name] Procedure Date : 09/06/2017 MRN: [id number] Attending MD: Iva Boop , MD Date of Birth: 09-02-1945 CSN: [id number] Age: 72 Admit Type: Inpatient Procedure:                Upper GI endoscopy Indications:              Suspected upper gastrointestinal bleeding Providers:                Iva Boop, MDMarilyn Everhart, Technician,                            Harold Barban, RN Referring MD:              Medicines:                Propofol per Anesthesia, Monitored Anesthesia Care Complications:            No immediate complications. Estimated blood loss:                            Minimal. Estimated Blood Loss:     Estimated blood loss: none. Procedure:                Pre-Anesthesia Assessment:                           - Prior to the procedure, a History and Physical                            was performed, and patient medications and                            allergies were reviewed. The patient's tolerance of                            previous anesthesia was also reviewed. The risks                            and benefits of the procedure and the sedation                            options and risks were discussed with the patient.                            All questions were answered, and informed consent                            was obtained. Prior Anticoagulants: The patient                            last took aspirin 1 day prior to the procedure. ASA                            Grade Assessment: III - A patient with severe  systemic disease. After reviewing the risks and                            benefits, the patient was deemed in satisfactory                            condition to undergo the procedure.                           After obtaining informed consent, the endoscope was                            passed under direct vision. Throughout the                            procedure,  the patient's blood pressure, pulse, and                            oxygen saturations were monitored continuously. The                            [id number] ([id number] scope was introduced through the                            mouth, and advanced to the second part of duodenum.                            The upper GI endoscopy was accomplished without                            difficulty. The patient tolerated the procedure                            well. Scope In: Scope Out: Findings:      One non-bleeding cratered gastric ulcer with no stigmata of bleeding was       found in the prepyloric region of the stomach. The lesion was 8 mm in       largest dimension. Biopsies were taken with a cold forceps for       histology. Verification of patient identification for the specimen was       done. Estimated blood loss was minimal.      The exam was otherwise without abnormality.      The cardia and gastric fundus were normal on retroflexion. Impression:               - Non-bleeding gastric ulcer with no stigmata of                            bleeding. Biopsied.                           - The examination was otherwise normal. Moderate Sedation:      Please see anesthesia notes, moderate sedation not given Recommendation:           - Return patient to hospital ward for ongoing care.                           -  Cardiac diet.                           - Continue present medications.                           - Use Protonix (pantoprazole) 40 mg PO daily.                           - Await pathology results.                           - Maybe ok to go home today (fine by me)                           OK to resume 81 mg ASA qd - ulcer should heal on                            PPI and he has CAD s/p stents                           I will f/u pathology and arrange GI f/u                           - Resume aspirin at prior dose tomorrow. Procedure Code(s):        --- Professional ---                            [phone], Esophagogastroduodenoscopy, flexible,                            transoral; with biopsy, single or multiple Diagnosis Code(s):        --- Professional ---                           K25.9, Gastric ulcer, unspecified as acute or                            chronic, without hemorrhage or perforation CPT copyright 2016 American Medical Association. All rights reserved. The codes documented in this report are preliminary and upon coder review may  be revised to meet current compliance requirements. Iva Boop, MD 09/06/2017 2:20:50 PM This report has been signed electronically. Number of Addenda: 0

## 2017-09-06 NOTE — Interval H&P Note (Signed)
History and Physical Interval Note:  09/06/2017 1:50 PM  [name] [name]  has presented today for surgery, with the diagnosis of anemia, FOBT +  The various methods of treatment have been discussed with the patient and family. After consideration of risks, benefits and other options for treatment, the patient has consented to  Procedure(s): ESOPHAGOGASTRODUODENOSCOPY (EGD) (N/A) as a surgical intervention .  The patient's history has been reviewed, patient examined, no change in status, stable for surgery.  I have reviewed the patient's chart and labs.  Questions were answered to the patient's satisfaction.     Stan Headarl Jakiah Goree

## 2017-09-06 NOTE — Anesthesia Preprocedure Evaluation (Addendum)
Anesthesia Evaluation  Patient identified by MRN, date of birth, ID band Patient awake    Reviewed: Allergy & Precautions, NPO status , Patient's Chart, lab work & pertinent test results  Airway Mallampati: II  TM Distance: >3 FB Neck ROM: Full    Dental no notable dental hx.    Pulmonary neg pulmonary ROS,    Pulmonary exam normal breath sounds clear to auscultation       Cardiovascular hypertension, negative cardio ROS Normal cardiovascular exam Rhythm:Regular Rate:Normal     Neuro/Psych negative neurological ROS  negative psych ROS   GI/Hepatic negative GI ROS, Neg liver ROS,   Endo/Other  negative endocrine ROSdiabetes  Renal/GU negative Renal ROS  negative genitourinary   Musculoskeletal   Abdominal   Peds  Hematology negative hematology ROS (+)   Anesthesia Other Findings   Reproductive/Obstetrics negative OB ROS                            Anesthesia Physical Anesthesia Plan  ASA: III  Anesthesia Plan: MAC   Post-op Pain Management:    Induction:   PONV Risk Score and Plan: Treatment may vary due to age or medical condition  Airway Management Planned: Mask, Natural Airway and Nasal Cannula  Additional Equipment:   Intra-op Plan:   Post-operative Plan:   Informed Consent: I have reviewed the patients History and Physical, chart, labs and discussed the procedure including the risks, benefits and alternatives for the proposed anesthesia with the patient or authorized representative who has indicated his/her understanding and acceptance.     Plan Discussed with:   Anesthesia Plan Comments:         Anesthesia Quick Evaluation

## 2017-09-06 NOTE — Anesthesia Procedure Notes (Signed)
Procedure Name: MAC Date/Time: 09/06/2017 1:56 PM Performed by: Wilburn Cornelia, CRNA Pre-anesthesia Checklist: Patient identified, Emergency Drugs available, Suction available, Patient being monitored and Timeout performed Patient Re-evaluated:Patient Re-evaluated prior to induction Oxygen Delivery Method: Nasal cannula Induction Type: IV induction Placement Confirmation: positive ETCO2 and breath sounds checked- equal and bilateral Dental Injury: Teeth and Oropharynx as per pre-operative assessment

## 2017-09-06 NOTE — Progress Notes (Signed)
Quency Morici to be D/C'd  per MD order. Discussed with the patient and all questions fully answered.  VSS, Skin clean, dry and intact without evidence of skin break down, no evidence of skin tears noted.  IV catheter discontinued intact. Site without signs and symptoms of complications. Dressing and pressure applied.  An After Visit Summary was printed and given to the patient. Patient received prescription.  D/c education completed with patient/family including follow up instructions, medication list, d/c activities limitations if indicated, with other d/c instructions as indicated by MD - patient able to verbalize understanding, all questions fully answered.   Patient instructed to return to ED, call 911, or call MD for any changes in condition.   Patient to be escorted via WC, and D/C home via private auto.

## 2017-09-06 NOTE — Progress Notes (Signed)
Daily Rounding Note  09/06/2017, 11:10 AM  LOS: 0 days   SUBJECTIVE:   Weakness improved/resolved.  No stools whatsoever.  Walking back forth from bed to bathroom, no issues with this.        OBJECTIVE:         Vital signs in last 24 hours:    Temp:  [98 F (36.7 C)-99.1 F (37.3 C)] 98.4 F (36.9 C) (02/15 0625) Pulse Rate:  [56-87] 59 (02/15 0625) Resp:  [13-24] 18 (02/15 0625) BP: (112-143)/(41-74) 136/63 (02/15 0625) SpO2:  [98 %-100 %] 100 % (02/15 0625) Weight:  [65.6 kg (144 lb 9.6 oz)] 65.6 kg (144 lb 9.6 oz) (02/14 2027) Last BM Date: 09/05/17 Filed Weights   09/05/17 0723 09/05/17 2027  Weight: 66.2 kg (146 lb) 65.6 kg (144 lb 9.6 oz)   General: pleasant, looks well, alert, comfortable   Heart: RRR Chest: clear bil.  No dyspnea or cough Abdomen: soft, NT, ND.  Active BS  Extremities:   Feet warm.  Swelling on left foot (chronic) Neuro/Psych:  Oriented x 3.  No weakness.  Calm, cooperative.  Moving all limbs without obvious problems.   Intake/Output from previous day: 02/14 0701 - 02/15 0700 In: 350 [Blood:350] Out: 200 [Urine:200]  Intake/Output this shift: No intake/output data recorded.  Lab Results: Recent Labs    09/05/17 0729 09/05/17 1330 09/05/17 1748 09/06/17 0630  WBC 5.4  --   --  5.4  HGB 9.0* 7.8* 8.4* 11.6*  HCT 27.2* 23.6* 25.8* 34.6*  PLT 359  --   --  293   BMET Recent Labs    09/05/17 0729 09/06/17 0630  NA 136 138  K 4.2 4.4  CL 102 103  CO2 23 24  GLUCOSE 168* 168*  BUN 14 13  CREATININE 0.89 0.91  CALCIUM 9.2 9.1   LFT Recent Labs    09/05/17 0729  PROT 6.6  ALBUMIN 3.8  AST 17  ALT 14*  ALKPHOS 57  BILITOT 0.8  BILIDIR <0.1*  IBILI NOT CALCULATED   PT/INR No results for input(s): LABPROT, INR in the last 72 hours. Hepatitis Panel No results for input(s): HEPBSAG, HCVAB, HEPAIGM, HEPBIGM in the last 72 hours.  Studies/Results: Ct Abdomen  Pelvis W Contrast  Result Date: 09/05/2017 CLINICAL DATA:  Abdominal distention, weight loss, abdominal pain EXAM: CT ABDOMEN AND PELVIS WITH CONTRAST TECHNIQUE: Multidetector CT imaging of the abdomen and pelvis was performed using the standard protocol following bolus administration of intravenous contrast. CONTRAST:  100mL ISOVUE-300 IOPAMIDOL (ISOVUE-300) INJECTION 61% COMPARISON:  None. FINDINGS: Lower chest: Stent visualized in the left anterior descending coronary artery. Heart is normal size. Lung bases clear. No effusions. Hepatobiliary: Fatty infiltration of the liver. No focal abnormality or biliary ductal dilatation. Gallbladder unremarkable. Pancreas: No focal abnormality or ductal dilatation. Spleen: No focal abnormality.  Normal size. Adrenals/Urinary Tract: At least 2 small nodules in the left adrenal gland, measuring 20 mm and 16 mm. These are nonspecific, favor adenomas. No focal mass in the right adrenal gland. Small cyst in the midpole of the right kidney. No hydronephrosis or suspicious renal lesion. No stones. Urinary bladder unremarkable. Stomach/Bowel: Moderate stool throughout the colon. Stomach, large and small bowel grossly unremarkable. Vascular/Lymphatic: Aortic atherosclerosis. No enlarged abdominal or pelvic lymph nodes. Reproductive: No visible focal abnormality. Other: No free fluid or free air. Musculoskeletal: No acute bony abnormality. Degenerative changes in the lumbar spine. IMPRESSION: Two small left adrenal nodules  which cannot be characterized on this enhanced CT. These could be further evaluated with noncontrast CT or MRI. Mild fatty infiltration of the liver. Moderate stool burden in the colon. Aortoiliac atherosclerosis. No acute findings in the abdomen or pelvis. Electronically Signed   By: Charlett Nose M.D.   On: 09/05/2017 11:05   Scheduled Meds: . carvedilol  3.125 mg Oral BID  . lisinopril  40 mg Oral Daily  . pantoprazole (PROTONIX) IV  40 mg Intravenous Q12H     Continuous Infusions: . sodium chloride Stopped (09/05/17 1858)   PRN Meds:.acetaminophen **OR** acetaminophen, ondansetron **OR** ondansetron (ZOFRAN) IV, traZODone   ASSESMENT:   *  Symptomatic anemia, dark/FOBT + stool.    Empiric Protonix 40 IV BID in place.   Hgb 7.8 >> 11.6 after 2 PRBCs early 2/14.    PLAN   *  EGD 1:30 today.      Jennye Moccasin  09/06/2017, 11:10 AM Pager: [phone]

## 2017-09-06 NOTE — Transfer of Care (Signed)
Immediate Anesthesia Transfer of Care Note  Patient: [name] [name]  Procedure(s) Performed: ESOPHAGOGASTRODUODENOSCOPY (EGD) (N/A )  Patient Location: Endoscopy Unit  Anesthesia Type:MAC  Level of Consciousness: sedated  Airway & Oxygen Therapy: Patient Spontanous Breathing and Patient connected to nasal cannula oxygen  Post-op Assessment: Report given to RN, Post -op Vital signs reviewed and stable and Patient moving all extremities X 4  Post vital signs: Reviewed and stable  Last Vitals:  Vitals:   09/06/17 0625 09/06/17 1323  BP: 136/63 (!) 145/59  Pulse: (!) 59   Resp: 18 (!) 23  Temp: 36.9 C 36.9 C  SpO2: 100% 99%    Last Pain:  Vitals:   09/06/17 1323  TempSrc: Oral  PainSc:          Complications: No apparent anesthesia complications

## 2017-09-06 NOTE — Discharge Summary (Signed)
Physician Discharge Summary  Patient ID: [name] KinsmanJimmie [name] MRN: [id number] DOB/AGE: 02-01-1946 72 y.o.  Admit date: 09/05/2017 Discharge date: 09/06/2017  Admission Diagnoses:  Discharge Diagnoses:  Principal Problem:   GIB (gastrointestinal bleeding) Gastric ulcer. Anemia, likely iron deficiency anemia. Weakness and fatigue. Active Problems:   Hyperlipidemia   Essential hypertension   Type II diabetes mellitus (HCC)   Abnormal EKG   Acute upper GI bleed   Discharged Condition: Stable.  Brief history: Patient is a 72 year old African-American male with past medical history significant for coronary artery disease with interventions in the past, diabetes mellitus, dyslipidemia, iron deficiency and hypertension.  Patient was admitted with weakness, fatigue and syncopal episodes.  Patient also reported about 10 pound weight loss in the last 1 month, with associated anorexia.  Patient reported having black stools a couple of days prior to presentation.  On presentation to the hospital, the patient's vital signs were stable.  However, the patient's hemoglobin had dropped significantly.  Patient's hemoglobin was 13 g/dL and January 2016, 9.9 g/dL on 13/24/401002/02/2018, and on the day of presentation, the patient's hemoglobin was 7.8 g/dL.  Patient was admitted for further assessment and workup likely upper GI bleed.  There is no use of NSAIDs or Goody powder.  Patient denies being an alcoholic.  There is no family history of gastric malignancies reported.  Hospital course: Gastrointestinal bleed: Patient had symptoms of significant anemia.  Lab work also revealed significant drop in patient's hemoglobin.  Fecal occult blood came back positive.  Patient was kept n.p.o., started on IV Protonix and fluids.  GI was consulted.  Patient proceeded with EGD.  EGD revealed gastric ulcer with no bleeding stigmata.  Biopsy of the gastric ulcer was taken.  Patient, patient's wife and son understand need to follow with GI  physician and PCP to discuss the gastric ulcer biopsied result.  This is more important as the patient has had history of weight loss and anorexia with associated early satiety.  Patient will be discharged on Protonix.  Since hemoglobin will need to be monitored.   Blood loss anemia - hemoglobin was 9 g/dL on presentation.  In January 2016, the patient's hemoglobin was 13 g/dL.  History of dark colored stool is noted.  Patient has undergone EGD (please see above).  Patient be discharged on PPI.  Hyperlipidemia - continue to monitor.  Continue Crestor.  DM type II - continue to optimize.    Hypertension - currently stable.   Consults: GI.  Upper GI scope was performed by the GI team.  Significant Diagnostic Studies: EGD.  The EGD revealed- Non-bleeding gastric ulcer with no stigmata of bleeding. Biopsied.   Discharge Exam: Blood pressure 125/64, pulse 61, temperature 99.2 F (37.3 C), temperature source Oral, resp. rate 16, height 5\' 11"  (1.803 m), weight 144 lb (65.3 kg), SpO2 100 %.   Disposition: 01-Home or Self Care  Discharge Instructions    Call MD for:   Complete by:  As directed    Please call MD if the symptoms worsen.   Diet - low sodium heart healthy   Complete by:  As directed    Increase activity slowly   Complete by:  As directed      Allergies as of 09/06/2017   No Known Allergies     Medication List    STOP taking these medications   ondansetron 4 MG disintegrating tablet Commonly known as:  ZOFRAN ODT     TAKE these medications   aspirin EC 81 MG  tablet Take 81 mg by mouth daily.   carvedilol 3.125 MG tablet Commonly known as:  COREG TAKE ONE TABLET BY MOUTH TWICE DAILY What changed:    how much to take  how to take this  when to take this   DSS 100 MG Caps Take 100 mg by mouth daily. What changed:    when to take this  reasons to take this   ferrous sulfate 325 (65 FE) MG tablet Take 1 tablet (325 mg total) by mouth 2 (two) times  daily with a meal. What changed:    when to take this  reasons to take this   fosinopril 40 MG tablet Commonly known as:  MONOPRIL Take 40 mg by mouth daily.   GLYXAMBI 10-5 MG Tabs Generic drug:  Empagliflozin-Linagliptin Take 1 tablet by mouth daily.   metFORMIN 1000 MG tablet Commonly known as:  GLUCOPHAGE Take 1,000 mg by mouth 2 (two) times daily with a meal.   MULTIVITAMIN PO Take by mouth daily.   pantoprazole 40 MG tablet Commonly known as:  PROTONIX Take 1 tablet (40 mg total) by mouth daily before breakfast. Start taking on:  09/07/2017   rosuvastatin 20 MG tablet Commonly known as:  CRESTOR Take 1 tablet (20 mg total) by mouth daily.        SignedBarnetta Chapel 09/06/2017, 3:51 PM

## 2017-09-06 NOTE — Anesthesia Postprocedure Evaluation (Signed)
Anesthesia Post Note  Patient: [name] [name]  Procedure(s) Performed: ESOPHAGOGASTRODUODENOSCOPY (EGD) (N/A )     Patient location during evaluation: Endoscopy Anesthesia Type: MAC Level of consciousness: awake and alert Pain management: pain level controlled Vital Signs Assessment: post-procedure vital signs reviewed and stable Respiratory status: spontaneous breathing, nonlabored ventilation, respiratory function stable and patient connected to nasal cannula oxygen Cardiovascular status: stable and blood pressure returned to baseline Postop Assessment: no apparent nausea or vomiting Anesthetic complications: no    Last Vitals:  Vitals:   09/06/17 1410 09/06/17 1420  BP: (!) 108/37 (!) 107/48  Pulse: (!) 59 (!) 59  Resp: (!) 24 20  Temp:    SpO2: 100% 98%    Last Pain:  Vitals:   09/06/17 1323  TempSrc: Oral  PainSc:                  Barnet Glasgow

## 2017-09-07 LAB — BPAM RBC
BLOOD PRODUCT EXPIRATION DATE: 201903122359
BLOOD PRODUCT EXPIRATION DATE: 201903122359
Blood Product Expiration Date: 201903132359
ISSUE DATE / TIME: 201902142124
ISSUE DATE / TIME: 201902150106
ISSUE DATE / TIME: 201902151621
UNIT TYPE AND RH: 7300
UNIT TYPE AND RH: 7300
Unit Type and Rh: 7300

## 2017-09-07 LAB — TYPE AND SCREEN
ABO/RH(D): B POS
ANTIBODY SCREEN: NEGATIVE
UNIT DIVISION: 0
UNIT DIVISION: 0
Unit division: 0

## 2017-09-09 ENCOUNTER — Encounter (HOSPITAL_COMMUNITY): Payer: Self-pay | Admitting: Internal Medicine

## 2017-09-11 NOTE — Progress Notes (Signed)
F/U appt only

## 2017-09-11 NOTE — Progress Notes (Signed)
Let him know ulcer is benign   Needs:   F/U me next available dx gastric ulcer

## 2017-09-13 ENCOUNTER — Telehealth: Payer: Self-pay | Admitting: Internal Medicine

## 2017-09-13 NOTE — Telephone Encounter (Signed)
See results notes for details.  

## 2017-09-17 ENCOUNTER — Other Ambulatory Visit (HOSPITAL_COMMUNITY): Payer: Medicare Other

## 2017-09-19 ENCOUNTER — Other Ambulatory Visit: Payer: Self-pay

## 2017-09-19 ENCOUNTER — Ambulatory Visit (INDEPENDENT_AMBULATORY_CARE_PROVIDER_SITE_OTHER): Payer: Medicare Other

## 2017-09-19 ENCOUNTER — Ambulatory Visit (HOSPITAL_COMMUNITY): Payer: Medicare Other | Attending: Cardiovascular Disease

## 2017-09-19 DIAGNOSIS — R531 Weakness: Secondary | ICD-10-CM | POA: Diagnosis not present

## 2017-09-19 DIAGNOSIS — I2510 Atherosclerotic heart disease of native coronary artery without angina pectoris: Secondary | ICD-10-CM | POA: Diagnosis not present

## 2017-09-19 DIAGNOSIS — I071 Rheumatic tricuspid insufficiency: Secondary | ICD-10-CM | POA: Diagnosis not present

## 2017-09-19 DIAGNOSIS — R002 Palpitations: Secondary | ICD-10-CM | POA: Diagnosis not present

## 2017-09-19 DIAGNOSIS — I422 Other hypertrophic cardiomyopathy: Secondary | ICD-10-CM | POA: Diagnosis not present

## 2017-09-19 DIAGNOSIS — E785 Hyperlipidemia, unspecified: Secondary | ICD-10-CM | POA: Diagnosis not present

## 2017-09-19 DIAGNOSIS — E119 Type 2 diabetes mellitus without complications: Secondary | ICD-10-CM | POA: Diagnosis not present

## 2017-09-19 LAB — ECHOCARDIOGRAM COMPLETE
CHL CUP MV DEC (S): 352
E decel time: 352 msec
E/e' ratio: 6.7
FS: 47 % — AB (ref 28–44)
IVS/LV PW RATIO, ED: 1.06
LA ID, A-P, ES: 34 mm
LA vol A4C: 51 ml
LA vol index: 31.1 mL/m2
LA vol: 57 mL
LADIAMINDEX: 1.86 cm/m2
LEFT ATRIUM END SYS DIAM: 34 mm
LVEEAVG: 6.7
LVEEMED: 6.7
LVELAT: 8.99 cm/s
LVOT area: 3.46 cm2
LVOTD: 21 mm
MVPKAVEL: 77.5 m/s
MVPKEVEL: 60.2 m/s
PW: 14.2 mm — AB (ref 0.6–1.1)
TDI e' lateral: 8.99
TDI e' medial: 9.1

## 2017-09-27 DIAGNOSIS — E785 Hyperlipidemia, unspecified: Secondary | ICD-10-CM | POA: Insufficient documentation

## 2017-09-27 DIAGNOSIS — K250 Acute gastric ulcer with hemorrhage: Secondary | ICD-10-CM

## 2017-09-27 DIAGNOSIS — I422 Other hypertrophic cardiomyopathy: Secondary | ICD-10-CM | POA: Insufficient documentation

## 2017-09-27 DIAGNOSIS — I2510 Atherosclerotic heart disease of native coronary artery without angina pectoris: Secondary | ICD-10-CM | POA: Insufficient documentation

## 2017-10-02 ENCOUNTER — Ambulatory Visit: Payer: Medicare Other | Admitting: Internal Medicine

## 2017-10-08 ENCOUNTER — Encounter: Payer: Self-pay | Admitting: Internal Medicine

## 2017-10-15 ENCOUNTER — Ambulatory Visit: Payer: Medicare Other | Admitting: Internal Medicine

## 2017-10-15 ENCOUNTER — Encounter: Payer: Self-pay | Admitting: Internal Medicine

## 2017-10-15 VITALS — BP 122/60 | HR 60 | Ht 71.0 in | Wt 149.0 lb

## 2017-10-15 DIAGNOSIS — I422 Other hypertrophic cardiomyopathy: Secondary | ICD-10-CM | POA: Diagnosis not present

## 2017-10-15 DIAGNOSIS — R531 Weakness: Secondary | ICD-10-CM | POA: Diagnosis not present

## 2017-10-15 DIAGNOSIS — R002 Palpitations: Secondary | ICD-10-CM | POA: Diagnosis not present

## 2017-10-15 NOTE — Patient Instructions (Signed)
Your physician wants you to follow-up in: 6 months with Dr. Hilty. You will receive a reminder letter in the mail two months in advance. If you don't receive a letter, please call our office to schedule the follow-up appointment.    

## 2017-10-15 NOTE — Progress Notes (Signed)
OFFICE NOTE  Chief Complaint:  Follow-up dyspnea and palpitations  Primary Care Physician: Jerlyn Ly, MD  HPI:  [name] [name] a 72 year old gentleman with a history of coronary artery disease, status post stent to mid LAD in 2009. 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 He also has hypertension, diabetes, and dyslipidemia. All these have been well controlled. 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  He recently underwent hernia repair surgery which was successful. He says has been doing well without any difficulty. He denies any chest pain or worsening shortness of breath. He does have a loud systolic murmur which was previously noted and does have a history of mild asymmetric septal hypertrophy on echo in 2011. He recent had lab work through his primary care provider and reported that the cholesterol and blood glucose are within normal limits. We are awaiting those results.  I saw Mr. Mikesell back in the office today. Overall he is doing well except for some mild swelling of the lower extremities. Blood pressure is well-controlled today. He's had no syncopal events. He does have a systolic murmur with asymmetric septal hypertrophy without outflow tract obstruction. This was looked at with echo last year and does not seem to worsen significantly. He denies any chest pain associated with his prior coronary artery disease and had a stent to the mid LAD in 2009.  02/06/2016  Mr. Biegel returns today for follow-up. He continues to work for the [employer]. Recently he's had no worsening shortness of breath or chest discomfort but he does not exercise regularly. When asked about energy level he reports he has had some worsening fatigue over the past 6-12 months but attributes that to the hot  weather. He does have a history of hypertrophic cardiomyopathy with his last echo in 2015. His EKG now shows new anterolateral T-wave inversions. These findings could be related to hypertrophic cardiomyopathy at the apex for ischemia in the LAD territory which he previously had.  03/13/2016  Mr. Reim was seen today back in follow-up. He denies any worsening shortness of breath or chest discomfort. He did undergo nuclear stress test which was negative for ischemia, and I suspect his EKG changes are due to changes in his hypertrophic cardiomyopathy. In general his exercise tolerance is decreased somewhat but fairly stable.  12/06/2016  Mr. Dohrman returns for follow-up. He denies any new complaints since his shortness of breath or chest discomfort. He says he occasionally gets some muscle spasm in his left arm and shoulder. He denies any chest involvement. He does have history of heart hypertrophic cardiopathy and will be due for repeat echocardiogram next year. He underwent nuclear stress testing last year which was negative for ischemia. He's noted to have some PVCs on his EKG but is asymptomatic with this. Recently I received some laboratory work from his primary care provider which was drawn in April 2018. Creatinine is 1.18, liver enzymes normal, hemoglobin A1c of 7, and total cholesterol 222, triglycerides 90, HDL-C 62, and LDL-C1 42. Our staff did inquire about his dose Crestor - apparently he has not been taking his medication per his recollection for 2 months, however ultimately it seems like he has not filled the prescription since 2016. Previously he was on Crestor 20 mg.  09/03/2017  Mr. Soloway was seen today in follow-up.  He  has a Chief Operating Officer of complaints.  He is complaining of some chest discomfort and left arm pressure, fatigue and lethargy, occasional lightheadedness and several weeks of hiccups.  He was seen in the ER several days ago for generalized weakness.  He was noted to have nausea  and essentially had no significant findings.  He was given a saline bolus and discharged.  Today blood pressure appears appropriate and heart rate is normal.  He was scheduled for a follow-up echocardiogram in May which we will likely due earlier.  In addition he notes some intermittent palpitations.  It is possible that the symptoms which are associated with fatigue could represent something like atrial fibrillation.  10/15/2017  Mr. Loveland returns today for follow-up of palpitations and dyspnea.  He underwent an echocardiogram which showed a hyperdynamic LV with some mild mid cavitary gradient.  A monitor showed infrequent PACs and PVCs.  He had complained to be short of breath and fatigued as well as feeling like his heart was pounding in his throat.  Subsequently lab work indicated he was significantly anemic with hemoglobin of 7.8.  Workup revealed a bleeding ulcer and this was appropriately treated.  He is now on a PPI and iron and underwent a transfusion.  His symptoms have significantly improved and he no longer has any chest discomfort, palpitations or pounding in his neck.  PMHx:  Past Medical History:  Diagnosis Date  . Coronary artery disease    a. 10/2007 s/p PCI of LAD (3.5x23 Vision BMS); b. 05/2008 Cath/PCI: LM nl, LAD 68m, 80 ISR (3.5x28 Promus DES), LCX min irregs, RI nl;  c. 12/2009 MV: EF 61%, no ischemia.  . Diabetes mellitus   . Dyslipidemia   . Hypertension   . Hypertrophic cardiomyopathy (HCC)    a. 03/2014 Echo: EF 60-65%, Gr 1 DD, sev septal basal hypertrophy, no SAM but near mid-cavitary obstruction, mild MR.    Past Surgical History:  Procedure Laterality Date  . BACK SURGERY  1992  . CORONARY STENT PLACEMENT  06/03/2008   Promus stent to mid LAD; 10/23/2007 Vision stent to mid LAD  . ESOPHAGOGASTRODUODENOSCOPY N/A 09/06/2017   Procedure: ESOPHAGOGASTRODUODENOSCOPY (EGD);  Surgeon: Iva Boop, MD;  Location: [facility] ENDOSCOPY;  Service: Endoscopy;  Laterality: N/A;  .  FEMORAL ENDARTERECTOMY  2010   right, with bovine patch angioplasty;  post cath, r./t aneursym/claudication  . INGUINAL HERNIA REPAIR Left 08/06/2013   Procedure: HERNIA REPAIR INGUINAL ADULT;  Surgeon: [name], MD;  Location: [facility] ORS;  Service: General;  Laterality: Left;  . TRANSTHORACIC ECHOCARDIOGRAM  01/12/2010   EF>55%; mild concentric LVH; mild MR, mild TR    FAMHx:  Family History  Problem Relation Age of Onset  . Kidney disease Mother   . Heart Problems Mother   . Cancer Brother     SOCHx:   reports that he has never smoked. He has never used smokeless tobacco. He reports that he does not drink alcohol or use drugs.  ALLERGIES:  No Known Allergies  ROS: Pertinent items noted in HPI and remainder of comprehensive ROS otherwise negative.  HOME MEDS: Current Outpatient Medications  Medication Sig Dispense Refill  . aspirin EC 81 MG tablet Take 81 mg by mouth daily.    . carvedilol (COREG) 3.125 MG tablet TAKE ONE TABLET BY MOUTH TWICE DAILY (Patient taking differently: 3.125mg  by mouth twice daily) 180 tablet 2  . docusate sodium 100 MG CAPS Take 100 mg by mouth daily. (Patient taking differently: Take  100 mg by mouth as needed (constipation). ) 10 capsule 0  . ferrous sulfate 325 (65 FE) MG tablet Take 1 tablet (325 mg total) by mouth 2 (two) times daily with a meal. 60 tablet 1  . fosinopril (MONOPRIL) 40 MG tablet Take 40 mg by mouth daily.     Marland Kitchen. glipiZIDE (GLUCOTROL) 10 MG tablet Take 1 tablet by mouth daily.    . metFORMIN (GLUCOPHAGE) 1000 MG tablet Take 1,000 mg by mouth 2 (two) times daily with a meal.      . Multiple Vitamins-Minerals (MULTIVITAMIN PO) Take by mouth daily.    . pantoprazole (PROTONIX) 40 MG tablet Take 1 tablet (40 mg total) by mouth daily before breakfast. 30 tablet 1  . rosuvastatin (CRESTOR) 20 MG tablet Take 1 tablet (20 mg total) by mouth daily. 30 tablet 5   No current facility-administered medications for this visit.      LABS/IMAGING: No results found for this or any previous visit (from the past 48 hour(s)). No results found.  VITALS: BP 122/60   Pulse 60   Ht 5\' 11"  (1.803 m)   Wt 149 lb (67.6 kg)   SpO2 97%   BMI 20.78 kg/m   EXAM: Deferred  EKG: Deferred  ASSESSMENT: 1. Coronary artery disease status post PCI to the mid LAD in 2009 - negative NST (2017) 2. Hypertension 3. Dyslipidemia 4. Diabetes type 2 - controlled, followed by PCP 5. SEM-asymmetric septal hypertrophy / probable hypertrophic cardiomyopathy 6. Palpitations 7. Fatigue - due to anemia from GIB  PLAN: 1.   [name] unfortunately had a recent ulcer and GI bleed with significant anemia.  This is improved and he was transfused.  His echo was reassuring.  His monitor failed to show any significant cause of his symptoms.  No changes to his medications today.  We will see him back in 6 months or sooner as necessary.  Chrystie NoseKenneth C. Haroldine Redler, MD, [facility]etnerFACC, FACP  [place]  [facility]  Medical Director of the [facility]  [facility] Diplomate of the American Board of Clinical Lipidology Attending Cardiologist  Direct Dial: [phone]  Fax: [phone]  Website:  [email or web address]   Mireyah Chervenak C Consuelo Suthers 10/15/2017, 10:45 AM

## 2017-10-31 ENCOUNTER — Encounter: Payer: Self-pay | Admitting: Internal Medicine

## 2017-10-31 ENCOUNTER — Other Ambulatory Visit (INDEPENDENT_AMBULATORY_CARE_PROVIDER_SITE_OTHER): Payer: Medicare Other

## 2017-10-31 ENCOUNTER — Ambulatory Visit: Payer: Medicare Other | Admitting: Internal Medicine

## 2017-10-31 VITALS — BP 144/62 | HR 60 | Ht 71.5 in | Wt 148.2 lb

## 2017-10-31 DIAGNOSIS — K250 Acute gastric ulcer with hemorrhage: Secondary | ICD-10-CM

## 2017-10-31 DIAGNOSIS — D62 Acute posthemorrhagic anemia: Secondary | ICD-10-CM | POA: Diagnosis not present

## 2017-10-31 DIAGNOSIS — Z1211 Encounter for screening for malignant neoplasm of colon: Secondary | ICD-10-CM | POA: Diagnosis not present

## 2017-10-31 LAB — CBC WITH DIFFERENTIAL/PLATELET
BASOS PCT: 0.5 % (ref 0.0–3.0)
Basophils Absolute: 0 10*3/uL (ref 0.0–0.1)
EOS PCT: 6 % — AB (ref 0.0–5.0)
Eosinophils Absolute: 0.3 10*3/uL (ref 0.0–0.7)
HCT: 39.3 % (ref 39.0–52.0)
HEMOGLOBIN: 13 g/dL (ref 13.0–17.0)
LYMPHS ABS: 1.4 10*3/uL (ref 0.7–4.0)
Lymphocytes Relative: 28.5 % (ref 12.0–46.0)
MCHC: 33 g/dL (ref 30.0–36.0)
MCV: 85.7 fl (ref 78.0–100.0)
MONO ABS: 0.4 10*3/uL (ref 0.1–1.0)
Monocytes Relative: 7.4 % (ref 3.0–12.0)
NEUTROS ABS: 2.9 10*3/uL (ref 1.4–7.7)
NEUTROS PCT: 57.6 % (ref 43.0–77.0)
Platelets: 222 10*3/uL (ref 150.0–400.0)
RBC: 4.59 Mil/uL (ref 4.22–5.81)
RDW: 14.7 % (ref 11.5–15.5)
WBC: 5 10*3/uL (ref 4.0–10.5)

## 2017-10-31 LAB — FERRITIN: Ferritin: 18.4 ng/mL — ABNORMAL LOW (ref 22.0–322.0)

## 2017-10-31 MED ORDER — PANTOPRAZOLE SODIUM 40 MG PO TBEC: 40.0000 mg | DELAYED_RELEASE_TABLET | Freq: Every day | ORAL | 3 refills | Status: AC

## 2017-10-31 NOTE — Progress Notes (Signed)
[name] [name] 72 y.o. Aug 17, 1945 [id number]  Assessment & Plan:   Encounter Diagnoses  Name Primary?  . Acute gastric ulcer with hemorrhage   . Acute blood loss anemia Yes  . Colon cancer screening     Things are improving.  Labs drawn today showed a normal hemoglobin, his ferritin is still low.  He will remain on his ferrous sulfate and take a PPI indefinitely.  I do not think he needs a follow-up EGD for the small ulcer thought likely due to aspirin use without PPI on board.  He has scheduled a screening colonoscopy.  A brother had colon cancer though I think he was elderly we will clarify The risks and benefits as well as alternatives of endoscopic procedure(s) have been discussed and reviewed. All questions answered. The patient agrees to proceed.  I appreciate the opportunity to care for this patient. CC: Hollar, Larina Earthly, MD    Subjective:   Chief Complaint: Follow-up of gastric ulcer  HPI The patient is here with his wife, I had met him in the hospital in February he had a small gastric ulcer that had caused an upper GI bleed.  Biopsies were benign and no H. pylori.  He was treated with medical therapy, iron replacement and is here for follow-up.  He feels better.  Hemoglobin checked recently about a month or so ago by primary care and was improving though not normalized.  He has since been the primary care in [place] and I reviewed that note and labs that show hemoglobin 12.5 on March 8 No Known Allergies Current Meds  Medication Sig  . aspirin EC 81 MG tablet Take 81 mg by mouth daily.  . carvedilol (COREG) 3.125 MG tablet TAKE ONE TABLET BY MOUTH TWICE DAILY (Patient taking differently: 3.128m by mouth twice daily)  . docusate sodium 100 MG CAPS Take 100 mg by mouth daily. (Patient taking differently: Take 100 mg by mouth as needed (constipation). )  . ferrous sulfate 325 (65 FE) MG tablet Take 1 tablet (325 mg total) by mouth 2 (two) times daily with a meal.    . fosinopril (MONOPRIL) 40 MG tablet Take 40 mg by mouth daily.   .Marland KitchenglipiZIDE (GLUCOTROL) 10 MG tablet Take 1 tablet by mouth daily.  . metFORMIN (GLUCOPHAGE) 1000 MG tablet Take 1,000 mg by mouth 2 (two) times daily with a meal.    . Multiple Vitamins-Minerals (MULTIVITAMIN PO) Take by mouth daily.  . pantoprazole (PROTONIX) 40 MG tablet Take 1 tablet (40 mg total) by mouth daily before breakfast.  . rosuvastatin (CRESTOR) 20 MG tablet Take 1 tablet (20 mg total) by mouth daily.   Past Medical History:  Diagnosis Date  . Bleeding acute gastric ulcer 08/2017  . Coronary artery disease    a. 10/2007 s/p PCI of LAD (3.5x23 Vision BMS); b. 05/2008 Cath/PCI: LM nl, LAD 254m80 ISR (3.5x28 Promus DES), LCX min irregs, RI nl;  c. 12/2009 MV: EF 61%, no ischemia.  . Diabetes mellitus   . Dyslipidemia   . Hypertension   . [place]   a. 03/2014 Echo: EF 60-65%, Gr 1 DD, sev septal basal hypertrophy, no SAM but near mid-cavitary obstruction, mild MR.   Past Surgical History:  Procedure Laterality Date  . BACK SURGERY  1992  . CORONARY STENT PLACEMENT  06/03/2008   Promus stent to mid LAD; 10/23/2007 Vision stent to mid LAD  . ESOPHAGOGASTRODUODENOSCOPY N/A 09/06/2017   Procedure: ESOPHAGOGASTRODUODENOSCOPY (EGD);  Surgeon: GeCarlean Purl  Ofilia Neas, MD;  Location: [facility] END[facility];  Service: Endoscopy;  Laterality: N/A;  . FEMORAL ENDARTERECTOMY  2010   right, with bovine patch angioplasty;  post cath, r./t aneursym/claudication  . INGUINAL HERNIA REPAIR Left 08/06/2013   Procedure: HERNIA REPAIR INGUINAL ADULT;  Surgeon: Scherry Ran, MD;  Location: [facility] ORS;  Service: General;  Laterality: Left;  . TRANSTHORACIC ECHOCARDIOGRAM  01/12/2010   EF>55%; mild concentric LVH; mild MR, mild TR   Social History   Social History Narrative   Retired and married 2 sons one is a Teacher, music in [place]   No alcohol tobacco or drug use   family history includes Colon cancer in his  brother; Heart Problems in his mother; Kidney disease in his mother.   Review of Systems As per HPI  Objective:   Physical Exam BP (!) 144/62   Pulse 60   Ht 5' 11.5" (1.816 m)   Wt 148 lb 4 oz (67.2 kg)   BMI 20.39 kg/m  Well-developed elderly black man in no acute distress   15 minutes time spent with patient > half in counseling coordination of care

## 2017-10-31 NOTE — Patient Instructions (Signed)
  We have sent the following medications to your pharmacy for you to pick up at your convenience: Pantoprazole   Your provider has requested that you go to the basement level for lab work before leaving today. Press "B" on the elevator. The lab is located at the first door on the left as you exit the elevator.  Normal BMI (Body Mass Index- based on height and weight) is between 23 and 30. Your BMI today is Body mass index is 20.39 kg/m. Marland Kitchen. Please consider follow up  regarding your BMI with your Primary Care Provider.   You have been scheduled for a colonoscopy. Please follow written instructions given to you at your visit today.  Please pick up your prep supplies at the pharmacy within the next 1-3 days. If you use inhalers (even only as needed), please bring them with you on the day of your procedure.  I appreciate the opportunity to care for you. Stan Headarl Gessner, MD, [facility]

## 2017-10-31 NOTE — Assessment & Plan Note (Signed)
Better\Stay on PPI

## 2017-11-01 ENCOUNTER — Encounter: Payer: Self-pay | Admitting: Internal Medicine

## 2017-11-01 NOTE — Progress Notes (Signed)
Labs show normal blood counts but iron level still low so needs to stay on the iron supplement  Will see him at colonoscopy

## 2017-12-05 ENCOUNTER — Other Ambulatory Visit: Payer: Self-pay | Admitting: Internal Medicine

## 2017-12-06 ENCOUNTER — Other Ambulatory Visit (HOSPITAL_COMMUNITY): Payer: Medicare Other

## 2017-12-09 ENCOUNTER — Encounter: Payer: Self-pay | Admitting: Internal Medicine

## 2017-12-17 ENCOUNTER — Encounter: Payer: Self-pay | Admitting: Internal Medicine

## 2017-12-17 ENCOUNTER — Ambulatory Visit (AMBULATORY_SURGERY_CENTER): Payer: Medicare Other | Admitting: Internal Medicine

## 2017-12-17 ENCOUNTER — Other Ambulatory Visit: Payer: Self-pay

## 2017-12-17 VITALS — BP 141/67 | HR 48 | Temp 99.8°F | Resp 9 | Ht 71.5 in | Wt 148.0 lb

## 2017-12-17 DIAGNOSIS — Z1211 Encounter for screening for malignant neoplasm of colon: Secondary | ICD-10-CM | POA: Diagnosis present

## 2017-12-17 NOTE — Op Note (Signed)
[facility] Patient Name: [name] [name] Procedure Date: 12/17/2017 2:42 PM MRN: [id number] Endoscopist: Iva Boop , MD Age: 72 Referring MD:  Date of Birth: Apr 13, 1946 Gender: Male Account #: [id number] Procedure:                Colonoscopy Indications:              Screening for colorectal malignant neoplasm Medicines:                Propofol per Anesthesia, Monitored Anesthesia Care Procedure:                Pre-Anesthesia Assessment:                           - Prior to the procedure, a History and Physical                            was performed, and patient medications and                            allergies were reviewed. The patient's tolerance of                            previous anesthesia was also reviewed. The risks                            and benefits of the procedure and the sedation                            options and risks were discussed with the patient.                            All questions were answered, and informed consent                            was obtained. Prior Anticoagulants: The patient has                            taken no previous anticoagulant or antiplatelet                            agents. ASA Grade Assessment: II - A patient with                            mild systemic disease. After reviewing the risks                            and benefits, the patient was deemed in                            satisfactory condition to undergo the procedure.                           After obtaining informed consent, the colonoscope  was passed under direct vision. Throughout the                            procedure, the patient's blood pressure, pulse, and                            oxygen saturations were monitored continuously. The                            Colonoscope was introduced through the anus and                            advanced to the the cecum, identified by                             appendiceal orifice and ileocecal valve. The                            colonoscopy was performed without difficulty. The                            patient tolerated the procedure well. The quality                            of the bowel preparation was adequate. The bowel                            preparation used was Miralax. The ileocecal valve,                            appendiceal orifice, and rectum were photographed. Scope In: 2:56:54 PM Scope Out: 3:13:49 PM Scope Withdrawal Time: 0 hours 12 minutes 19 seconds  Total Procedure Duration: 0 hours 16 minutes 55 seconds  Findings:                 External and internal hemorrhoids were found during                            retroflexion.                           The exam was otherwise without abnormality on                            direct and retroflexion views. Complications:            No immediate complications. Estimated Blood Loss:     Estimated blood loss: none. Impression:               - External and internal hemorrhoids.                           - The examination was otherwise normal on direct                            and retroflexion views.                           -  No specimens collected. Recommendation:           - Patient has a contact number available for                            emergencies. The signs and symptoms of potential                            delayed complications were discussed with the                            patient. Return to normal activities tomorrow.                            Written discharge instructions were provided to the                            patient.                           - Resume previous diet.                           - Continue present medications.                           - No repeat colonoscopy due to age and the absence                            of colonic polyps. Iva Boop, MD 12/17/2017 3:19:37 PM This report has been signed electronically.

## 2017-12-17 NOTE — Progress Notes (Signed)
To recovery, report to RN, VSS. 

## 2017-12-17 NOTE — Patient Instructions (Addendum)
No polyps or cancer seen.  You do have some swollen hemorrhoids - this usually occurs from the preparation. If you have hemorrhoid problems (swelling, itching, bleeding) I am able to treat those with an in-office procedure. If you like, please call my office at [phone] to schedule an appointment and I can evaluate you further.  I appreciate the opportunity to care for you. Iva Boop, MD, [facility]   Handout given for hemorrhoids.    YOU HAD AN ENDOSCOPIC PROCEDURE TODAY AT THE [facility]:   Refer to the procedure report that was given to you for any specific questions about what was found during the examination.  If the procedure report does not answer your questions, please call your gastroenterologist to clarify.  If you requested that your care partner not be given the details of your procedure findings, then the procedure report has been included in a sealed envelope for you to review at your convenience later.  YOU SHOULD EXPECT: Some feelings of bloating in the abdomen. Passage of more gas than usual.  Walking can help get rid of the air that was put into your GI tract during the procedure and reduce the bloating. If you had a lower endoscopy (such as a colonoscopy or flexible sigmoidoscopy) you may notice spotting of blood in your stool or on the toilet paper. If you underwent a bowel prep for your procedure, you may not have a normal bowel movement for a few days.  Please Note:  You might notice some irritation and congestion in your nose or some drainage.  This is from the oxygen used during your procedure.  There is no need for concern and it should clear up in a day or so.  SYMPTOMS TO REPORT IMMEDIATELY:   Following lower endoscopy (colonoscopy or flexible sigmoidoscopy):  Excessive amounts of blood in the stool  Significant tenderness or worsening of abdominal pains  Swelling of the abdomen that is new, acute  Fever of 100F or higher   For urgent or  emergent issues, a gastroenterologist can be reached at any hour by calling [phone].   DIET:  We do recommend a small meal at first, but then you may proceed to your regular diet.  Drink plenty of fluids but you should avoid alcoholic beverages for 24 hours.  ACTIVITY:  You should plan to take it easy for the rest of today and you should NOT DRIVE or use heavy machinery until tomorrow (because of the sedation medicines used during the test).    FOLLOW UP: Our staff will call the number listed on your records the next business day following your procedure to check on you and address any questions or concerns that you may have regarding the information given to you following your procedure. If we do not reach you, we will leave a message.  However, if you are feeling well and you are not experiencing any problems, there is no need to return our call.  We will assume that you have returned to your regular daily activities without incident.  If any biopsies were taken you will be contacted by phone or by letter within the next 1-3 weeks.  Please call us at [phone] if you have not heard about the biopsies in 3 weeks.    SIGNATURES/CONFIDENTIALITY: You and/or your care partner have signed paperwork which will be entered into your electronic medical record.  These signatures attest to the fact that that the information above on your After  Visit Summary has been reviewed and is understood.  Full responsibility of the confidentiality of this discharge information lies with you and/or your care-partner. 

## 2017-12-18 ENCOUNTER — Telehealth: Payer: Self-pay | Admitting: *Deleted

## 2017-12-18 NOTE — Telephone Encounter (Signed)
  Follow up Call-  Call back number 12/17/2017  Post procedure Call Back phone  # [phone]  Permission to leave phone message Yes  Some recent data might be hidden     Patient questions:  Do you have a fever, pain , or abdominal swelling? No. Pain Score  0 *  Have you tolerated food without any problems? Yes.    Have you been able to return to your normal activities? Yes.    Do you have any questions about your discharge instructions: Diet   No. Medications  No. Follow up visit  No.  Do you have questions or concerns about your Care? No.  Actions: * If pain score is 4 or above: No action needed, pain <4.

## 2018-03-29 ENCOUNTER — Other Ambulatory Visit: Payer: Self-pay | Admitting: Internal Medicine

## 2018-05-02 ENCOUNTER — Other Ambulatory Visit: Payer: Self-pay | Admitting: Internal Medicine

## 2018-05-05 NOTE — Telephone Encounter (Signed)
Metformin refill refused Defer to PCP 

## 2018-05-21 ENCOUNTER — Other Ambulatory Visit: Payer: Self-pay

## 2018-05-21 ENCOUNTER — Emergency Department (HOSPITAL_COMMUNITY): Payer: Medicare Other

## 2018-05-21 ENCOUNTER — Observation Stay (HOSPITAL_COMMUNITY): Payer: Medicare Other

## 2018-05-21 ENCOUNTER — Encounter (HOSPITAL_COMMUNITY): Payer: Self-pay | Admitting: Emergency Medicine

## 2018-05-21 ENCOUNTER — Observation Stay (HOSPITAL_COMMUNITY)
Admission: EM | Admit: 2018-05-21 | Discharge: 2018-05-22 | Disposition: A | Discharge status: Home / Self Care | Payer: Medicare Other | Attending: Internal Medicine | Admitting: Internal Medicine

## 2018-05-21 DIAGNOSIS — R001 Bradycardia, unspecified: Secondary | ICD-10-CM | POA: Diagnosis not present

## 2018-05-21 DIAGNOSIS — R55 Syncope and collapse: Secondary | ICD-10-CM

## 2018-05-21 DIAGNOSIS — E782 Mixed hyperlipidemia: Secondary | ICD-10-CM

## 2018-05-21 DIAGNOSIS — I10 Essential (primary) hypertension: Secondary | ICD-10-CM | POA: Diagnosis not present

## 2018-05-21 DIAGNOSIS — E785 Hyperlipidemia, unspecified: Principal | ICD-10-CM | POA: Insufficient documentation

## 2018-05-21 DIAGNOSIS — Z79899 Other long term (current) drug therapy: Secondary | ICD-10-CM | POA: Diagnosis not present

## 2018-05-21 DIAGNOSIS — Z7984 Long term (current) use of oral hypoglycemic drugs: Secondary | ICD-10-CM | POA: Diagnosis not present

## 2018-05-21 DIAGNOSIS — I2510 Atherosclerotic heart disease of native coronary artery without angina pectoris: Secondary | ICD-10-CM | POA: Diagnosis present

## 2018-05-21 DIAGNOSIS — Z7982 Long term (current) use of aspirin: Secondary | ICD-10-CM | POA: Diagnosis not present

## 2018-05-21 DIAGNOSIS — E119 Type 2 diabetes mellitus without complications: Secondary | ICD-10-CM | POA: Diagnosis not present

## 2018-05-21 LAB — DIFFERENTIAL
BASOS ABS: 0 10*3/uL (ref 0.0–0.1)
Basophils Relative: 1 %
EOS ABS: 0.3 10*3/uL (ref 0.0–0.5)
EOS PCT: 3 %
Lymphocytes Relative: 13 %
Lymphs Abs: 1 10*3/uL (ref 0.7–4.0)
MONO ABS: 0.6 10*3/uL (ref 0.1–1.0)
MONOS PCT: 7 %
Neutro Abs: 6 10*3/uL (ref 1.7–7.7)
Neutrophils Relative %: 76 %

## 2018-05-21 LAB — BASIC METABOLIC PANEL
Anion gap: 8 (ref 5–15)
BUN: 13 mg/dL (ref 8–23)
CHLORIDE: 102 mmol/L (ref 98–111)
CO2: 27 mmol/L (ref 22–32)
CREATININE: 0.99 mg/dL (ref 0.61–1.24)
Calcium: 8.8 mg/dL — ABNORMAL LOW (ref 8.9–10.3)
GFR calc Af Amer: >60 mL/min (ref >60)
Glucose, Bld: 260 mg/dL — ABNORMAL HIGH (ref 70–99)
Potassium: 4.6 mmol/L (ref 3.5–5.1)
SODIUM: 137 mmol/L (ref 135–145)

## 2018-05-21 LAB — GLUCOSE, CAPILLARY: Glucose-Capillary: 189 mg/dL — ABNORMAL HIGH (ref 70–99)

## 2018-05-21 LAB — URINALYSIS, ROUTINE W REFLEX MICROSCOPIC
BACTERIA UA: NONE SEEN
BILIRUBIN URINE: NEGATIVE
HGB URINE DIPSTICK: NEGATIVE
KETONES UR: NEGATIVE mg/dL
LEUKOCYTES UA: NEGATIVE
NITRITE: NEGATIVE
PH: 5 (ref 5.0–8.0)
Protein, ur: NEGATIVE mg/dL
Specific Gravity, Urine: 1.015 (ref 1.005–1.030)

## 2018-05-21 LAB — CBG MONITORING, ED: Glucose-Capillary: 221 mg/dL — ABNORMAL HIGH (ref 70–99)

## 2018-05-21 LAB — TROPONIN I
Troponin I: <0.03 ng/mL (ref <0.03)
Troponin I: <0.03 ng/mL (ref <0.03)

## 2018-05-21 LAB — CBC
HEMATOCRIT: 39.3 % (ref 39.0–52.0)
Hemoglobin: 12.5 g/dL — ABNORMAL LOW (ref 13.0–17.0)
MCH: 29.1 pg (ref 26.0–34.0)
MCHC: 31.8 g/dL (ref 30.0–36.0)
MCV: 91.6 fL (ref 80.0–100.0)
PLATELETS: 189 10*3/uL (ref 150–400)
RBC: 4.29 MIL/uL (ref 4.22–5.81)
RDW: 12.7 % (ref 11.5–15.5)
WBC: 8 10*3/uL (ref 4.0–10.5)
nRBC: 0 % (ref 0.0–0.2)

## 2018-05-21 LAB — HEPATIC FUNCTION PANEL
ALK PHOS: 68 U/L (ref 38–126)
ALT: 16 U/L (ref 0–44)
AST: 20 U/L (ref 15–41)
Albumin: 3.7 g/dL (ref 3.5–5.0)
BILIRUBIN TOTAL: 0.9 mg/dL (ref 0.3–1.2)
Bilirubin, Direct: 0.1 mg/dL (ref 0.0–0.2)
Indirect Bilirubin: 0.8 mg/dL (ref 0.3–0.9)
Total Protein: 6.3 g/dL — ABNORMAL LOW (ref 6.5–8.1)

## 2018-05-21 LAB — TSH: TSH: 1.758 u[IU]/mL (ref 0.350–4.500)

## 2018-05-21 MED ORDER — ENOXAPARIN SODIUM 40 MG/0.4ML ~~LOC~~ SOLN
40.0000 mg | SUBCUTANEOUS | Status: DC
  Administered 2018-05-21: 40 mg via SUBCUTANEOUS
  Filled 2018-05-21: qty 0.4

## 2018-05-21 MED ORDER — FOSINOPRIL SODIUM 20 MG PO TABS
40.0000 mg | ORAL_TABLET | Freq: Every day | ORAL | Status: DC
  Filled 2018-05-21 (×2): qty 2

## 2018-05-21 MED ORDER — SODIUM CHLORIDE 0.9% FLUSH
3.0000 mL | Freq: Two times a day (BID) | INTRAVENOUS | Status: DC
  Administered 2018-05-21 – 2018-05-22 (×2): 3 mL via INTRAVENOUS

## 2018-05-21 MED ORDER — PANTOPRAZOLE SODIUM 40 MG PO TBEC
40.0000 mg | DELAYED_RELEASE_TABLET | Freq: Every day | ORAL | Status: DC
  Administered 2018-05-22: 40 mg via ORAL
  Filled 2018-05-21: qty 1

## 2018-05-21 MED ORDER — INSULIN ASPART 100 UNIT/ML ~~LOC~~ SOLN: 0.0000 [IU] | Freq: Every day | SUBCUTANEOUS | Status: DC

## 2018-05-21 MED ORDER — SODIUM CHLORIDE 0.9 % IV BOLUS
500.0000 mL | Freq: Once | INTRAVENOUS | Status: AC
  Administered 2018-05-21: 500 mL via INTRAVENOUS

## 2018-05-21 MED ORDER — ACETAMINOPHEN 650 MG RE SUPP: 650.0000 mg | Freq: Four times a day (QID) | RECTAL | Status: DC | PRN

## 2018-05-21 MED ORDER — GLIPIZIDE 5 MG PO TABS
10.0000 mg | ORAL_TABLET | Freq: Every day | ORAL | Status: DC
  Administered 2018-05-21 – 2018-05-22 (×2): 10 mg via ORAL
  Filled 2018-05-21 (×2): qty 2

## 2018-05-21 MED ORDER — ACETAMINOPHEN 325 MG PO TABS: 650.0000 mg | ORAL_TABLET | Freq: Four times a day (QID) | ORAL | Status: DC | PRN

## 2018-05-21 MED ORDER — ASPIRIN EC 81 MG PO TBEC
81.0000 mg | DELAYED_RELEASE_TABLET | Freq: Every day | ORAL | Status: DC
  Administered 2018-05-22: 81 mg via ORAL
  Filled 2018-05-21: qty 1

## 2018-05-21 MED ORDER — ONDANSETRON HCL 4 MG PO TABS: 4.0000 mg | ORAL_TABLET | Freq: Four times a day (QID) | ORAL | Status: DC | PRN

## 2018-05-21 MED ORDER — SODIUM CHLORIDE 0.9 % IV SOLN
INTRAVENOUS | Status: DC
  Administered 2018-05-21 – 2018-05-22 (×2): via INTRAVENOUS

## 2018-05-21 MED ORDER — ONDANSETRON HCL 4 MG/2ML IJ SOLN: 4.0000 mg | Freq: Four times a day (QID) | INTRAMUSCULAR | Status: DC | PRN

## 2018-05-21 MED ORDER — INSULIN ASPART 100 UNIT/ML ~~LOC~~ SOLN
0.0000 [IU] | Freq: Three times a day (TID) | SUBCUTANEOUS | Status: DC
  Administered 2018-05-22 (×2): 3 [IU] via SUBCUTANEOUS

## 2018-05-21 MED ORDER — ROSUVASTATIN CALCIUM 20 MG PO TABS
20.0000 mg | ORAL_TABLET | Freq: Every day | ORAL | Status: DC
  Administered 2018-05-22: 20 mg via ORAL
  Filled 2018-05-21: qty 1

## 2018-05-21 NOTE — ED Triage Notes (Signed)
Per EMS patient had a syncopal episode at [employer] with brady cardia. Patient was helped to floor by staff when he was found to be slumped over in his seat. Patient denies N/V/D, denies light headness or pain. He states feeling a little warm this morning.

## 2018-05-21 NOTE — H&P (Addendum)
History and Physical    [name] [name] ZOX:[id number] DOB: May 10, 1946 DOA: 05/21/2018  PCP: Jerlyn Ly, MD  Patient coming from: Home  I have personally briefly reviewed patient's old medical records in [facility]  Chief Complaint: Presyncope  HPI: [name] [name] is a 72 y.o. male with medical history significant of coronary artery disease, diabetes, hyperlipidemia, hypertension, who recently developed a sinusitis and was taking Mucinex DM.  He reports improvement in his overall symptoms.  This morning he felt he was back to baseline.  He had gone out for a meal at a restaurant and while he was eating began to feel significantly lightheaded.  He felt that his vision was getting dark and he slumped over to the side.  He fell to the floor.  Denies losing consciousness.  Denies any head injury.  No urinary or bowel incontinence.  He very quickly returned to his baseline mental status.  Denies any nausea, chest pain or shortness of breath.  Has not had any fever lately, no vomiting or diarrhea.  P.o. intake has been normal for him.  ED Course: Vitals were noted to be stable.  Orthostatic signs were noted to be negative.  CT head was also unrevealing.  Basic chemistry and CBC also unrevealing.  Heart rate was noted to be in the 40s.  EKG showed sinus bradycardia.  Troponin was negative.  Review of Systems: As per HPI otherwise 10 point review of systems negative.    Past Medical History:  Diagnosis Date  . Bleeding acute gastric ulcer 08/2017  . Coronary artery disease    a. 10/2007 s/p PCI of LAD (3.5x23 Vision BMS); b. 05/2008 Cath/PCI: LM nl, LAD 45m, 80 ISR (3.5x28 Promus DES), LCX min irregs, RI nl;  c. 12/2009 MV: EF 61%, no ischemia.  . Diabetes mellitus   . Dyslipidemia   . Hypertension   . Hypertrophic cardiomyopathy (HCC)    a. 03/2014 Echo: EF 60-65%, Gr 1 DD, sev septal basal hypertrophy, no SAM but near mid-cavitary obstruction, mild MR.    Past Surgical History:    Procedure Laterality Date  . BACK SURGERY  1992  . CORONARY STENT PLACEMENT  06/03/2008   Promus stent to mid LAD; 10/23/2007 Vision stent to mid LAD  . ESOPHAGOGASTRODUODENOSCOPY N/A 09/06/2017   Procedure: ESOPHAGOGASTRODUODENOSCOPY (EGD);  Surgeon: Iva Boop, MD;  Location: [facility] ENDOSCOPY;  Service: Endoscopy;  Laterality: N/A;  . FEMORAL ENDARTERECTOMY  2010   right, with bovine patch angioplasty;  post cath, r./t aneursym/claudication  . INGUINAL HERNIA REPAIR Left 08/06/2013   Procedure: HERNIA REPAIR INGUINAL ADULT;  Surgeon: [name], MD;  Location: [facility] ORS;  Service: General;  Laterality: Left;  . TRANSTHORACIC ECHOCARDIOGRAM  01/12/2010   EF>55%; mild concentric LVH; mild MR, mild TR    Social History:  reports that he has never smoked. He has never used smokeless tobacco. He reports that he does not drink alcohol or use drugs.  No Known Allergies  Family History  Problem Relation Age of Onset  . Kidney disease Mother   . Heart Problems Mother   . Colon cancer Brother        >60, deceased from colon ca    Prior to Admission medications   Medication Sig Start Date End Date Taking? Authorizing Provider  aspirin EC 81 MG tablet Take 81 mg by mouth daily.   Yes [provider]  carvedilol (COREG) 3.125 MG tablet TAKE ONE TABLET BY MOUTH TWICE DAILY 12/05/17  Yes  [name], MD  ferrous sulfate 325 (65 FE) MG tablet Take 1 tablet (325 mg total) by mouth 2 (two) times daily with a meal. 09/06/17  Yes Berton Mount I, MD  fosinopril (MONOPRIL) 40 MG tablet TAKE ONE (1) TABLET EACH DAY 03/31/18  Yes Hilty, Lisette Abu, MD  glipiZIDE (GLUCOTROL) 10 MG tablet Take 1 tablet by mouth daily. 07/05/17  Yes [provider]  metFORMIN (GLUCOPHAGE) 1000 MG tablet Take 1,000 mg by mouth 2 (two) times daily with a meal.     Yes [provider]  Multiple Vitamins-Minerals (MULTIVITAMIN PO) Take by mouth daily.   Yes [provider]   pantoprazole (PROTONIX) 40 MG tablet Take 1 tablet (40 mg total) by mouth daily before breakfast. 10/31/17  Yes Iva Boop, MD  rosuvastatin (CRESTOR) 20 MG tablet Take 1 tablet (20 mg total) by mouth daily. 12/06/16  Yes [name], MD    Physical Exam: Vitals:   05/21/18 1415 05/21/18 1430 05/21/18 1500 05/21/18 1530  BP:  138/62 (!) 150/56 (!) 128/55  Pulse: 60 (!) 57 64 (!) 58  Resp: 20 18 18 19   Temp:      SpO2: 98% 97% 100% 96%  Weight:      Height:        Constitutional: NAD, calm, comfortable Eyes: PERRL, lids and conjunctivae normal ENMT: Mucous membranes are moist. Posterior pharynx clear of any exudate or lesions.Normal dentition.  Neck: normal, supple, no masses, no thyromegaly Respiratory: clear to auscultation bilaterally, no wheezing, no crackles. Normal respiratory effort. No accessory muscle use.  Cardiovascular: Regular rate and rhythm, no murmurs / rubs / gallops. No extremity edema. 2+ pedal pulses. No carotid bruits.  Abdomen: no tenderness, no masses palpated. No hepatosplenomegaly. Bowel sounds positive.  Musculoskeletal: no clubbing / cyanosis. No joint deformity upper and lower extremities. Good ROM, no contractures. Normal muscle tone.  Skin: no rashes, lesions, ulcers. No induration Neurologic: CN 2-12 grossly intact. Sensation intact, DTR normal. Strength 5/5 in all 4.  Psychiatric: Normal judgment and insight. Alert and oriented x 3. Normal mood.    Labs on Admission: I have personally reviewed following labs and imaging studies  CBC: Recent Labs  Lab 05/21/18 0951  WBC 8.0  NEUTROABS 6.0  HGB 12.5*  HCT 39.3  MCV 91.6  PLT 189   Basic Metabolic Panel: Recent Labs  Lab 05/21/18 0951  NA 137  K 4.6  CL 102  CO2 27  GLUCOSE 260*  BUN 13  CREATININE 0.99  CALCIUM 8.8*   GFR: Estimated Creatinine Clearance: 67.4 mL/min (by C-G formula based on SCr of 0.99 mg/dL). Liver Function Tests: Recent Labs  Lab 05/21/18 0951  AST  20  ALT 16  ALKPHOS 68  BILITOT 0.9  PROT 6.3*  ALBUMIN 3.7   No results for input(s): LIPASE, AMYLASE in the last 168 hours. No results for input(s): AMMONIA in the last 168 hours. Coagulation Profile: No results for input(s): INR, PROTIME in the last 168 hours. Cardiac Enzymes: Recent Labs  Lab 05/21/18 0951  TROPONINI <0.03   BNP (last 3 results) No results for input(s): PROBNP in the last 8760 hours. HbA1C: No results for input(s): HGBA1C in the last 72 hours. CBG: Recent Labs  Lab 05/21/18 0929  GLUCAP 221*   Lipid Profile: No results for input(s): CHOL, HDL, LDLCALC, TRIG, CHOLHDL, LDLDIRECT in the last 72 hours. Thyroid Function Tests: No results for input(s): TSH, T4TOTAL, FREET4, T3FREE, THYROIDAB in the last 72  hours. Anemia Panel: No results for input(s): VITAMINB12, FOLATE, FERRITIN, TIBC, IRON, RETICCTPCT in the last 72 hours. Urine analysis:    Component Value Date/Time   COLORURINE YELLOW 05/21/2018 0926   APPEARANCEUR CLEAR 05/21/2018 0926   LABSPEC 1.015 05/21/2018 0926   PHURINE 5.0 05/21/2018 0926   GLUCOSEU >=500 (A) 05/21/2018 0926   HGBUR NEGATIVE 05/21/2018 0926   HGBUR negative 09/23/2007 0907   BILIRUBINUR NEGATIVE 05/21/2018 0926   KETONESUR NEGATIVE 05/21/2018 0926   PROTEINUR NEGATIVE 05/21/2018 0926   UROBILINOGEN 0.2 07/28/2011 1456   NITRITE NEGATIVE 05/21/2018 0926   LEUKOCYTESUR NEGATIVE 05/21/2018 0926    Radiological Exams on Admission: Dg Chest 2 View  Result Date: 05/21/2018 CLINICAL DATA:  Syncopal episode this morning, bradycardia, head congestion and dizziness. Possible URI. EXAM: CHEST - 2 VIEW COMPARISON:  Chest x-ray of August 30, 2017 FINDINGS: The lungs are mildly hypoinflated. There are coarse lung markings in the retrocardiac region which are difficult to triangulate on the frontal radiograph. There is no pleural effusion. The heart and pulmonary vascularity are normal. The mediastinum is normal in width. The  trachea is midline. There is calcification in the wall of the aortic arch. The bony thorax exhibits no acute abnormality. IMPRESSION: Subsegmental atelectasis or developing pneumonia in the left lower lobe. The findings are accentuated by mild hypoinflation. No CHF. Followup PA and lateral chest X-ray is recommended in 3-4 weeks following trial of antibiotic therapy to ensure resolution and exclude underlying malignancy. Thoracic aortic atherosclerosis. Electronically Signed   By: David  [place] M.D.   On: 05/21/2018 11:01   Ct Head Wo Contrast  Result Date: 05/21/2018 CLINICAL DATA:  Syncope EXAM: CT HEAD WITHOUT CONTRAST TECHNIQUE: Contiguous axial images were obtained from the base of the skull through the vertex without intravenous contrast. COMPARISON:  July 28, 2011 FINDINGS: Brain: There is age related volume loss. There is no intracranial mass, hemorrhage, extra-axial fluid collection, or midline shift. The brain parenchyma appears unremarkable. No acute infarct is evident. Vascular: There is no appreciable hyperdense vessel. There is calcification in each carotid siphon region. Skull: Bony calvarium appears intact. Sinuses/Orbits: There is mucosal thickening in each maxillary antrum. There is opacification and mucosal thickening involving multiple ethmoid air cells. There is mild mucosal thickening in the medial left sphenoid sinus. Orbits appear symmetric bilaterally. Other: Mastoid air cells are clear. IMPRESSION: Areas of paranasal sinus disease.  Study otherwise unremarkable. Electronically Signed   By: Bretta Bang III M.D.   On: 05/21/2018 12:05    EKG: Independently reviewed.  This bradycardia  Assessment/Plan Active Problems:   Hyperlipidemia   Essential hypertension   CAD (coronary artery disease)   Type II diabetes mellitus (HCC)   Syncope     1. Presyncope.  Unclear etiology.  Possibly related to bradycardia.  No signs of significant dehydration.  Monitor on telemetry.   Cycle cardiac markers.  Will hold further beta-blockers.  Cardiology following.  Will defer to cardiology whether repeat echocardiogram as needed, last study done in 08/2017.  Check carotid Dopplers. 2. Hypertension.  Continue outpatient regimen.  Blood pressure currently stable. 3. Hyperlipidemia.  Continue statin 4. Diabetes.  Hold metformin.  We will continue glipizide and started Sital insulin. 5. Coronary artery disease.  Continue on aspirin.  No complaints of chest pain. 6. Abnormal chest x-ray.  Atelectasis versus developing pneumonia.  No cough, fever or shortness of breath at this time.  Fever atelectasis.  We will encourage incentive spirometry.  Will need repeat x-ray in  3 to 4 weeks to ensure resolution.  DVT prophylaxis: Lovenox Code Status: Full code Family Communication: Discussed with wife at the bedside Disposition Plan: Discharge home once improved Consults called: Cardiology Admission status: Observation, telemetry  Erick Blinks MD [facility] Pager [phone]  If 7PM-7AM, please contact night-coverage [email or web address] Password [email or web address]  05/21/2018, 4:08 PM

## 2018-05-21 NOTE — ED Provider Notes (Signed)
[facility] EMERGENCY DEPARTMENT Provider Note   CSN: [id number] Arrival date & time: 05/21/18  8295     History   Chief Complaint Chief Complaint  Patient presents with  . Loss of Consciousness    HPI [name] [name] is a 72 y.o. male.  Patient has syncopal episode at the restaurant today he was sitting in the chair felt dizzy and passed out.  When paramedics arrived he was bradycardic.  Patient has a history of coronary artery disease and is on a beta-blocker.  His initial pulse on EKG was 47.  Patient also states he believes he is getting over sinus infection  The history is provided by the patient. No language interpreter was used.  Loss of Consciousness   This is a new problem. The current episode started less than 1 hour ago. The problem occurs rarely. The problem has been resolved. He lost consciousness for a period of less than one minute. The problem is associated with normal activity. Associated symptoms include dizziness and visual change. Pertinent negatives include abdominal pain, back pain, chest pain, congestion, headaches and seizures. He has tried nothing for the symptoms. The treatment provided significant relief. His past medical history does not include CVA.    Past Medical History:  Diagnosis Date  . Bleeding acute gastric ulcer 08/2017  . Coronary artery disease    a. 10/2007 s/p PCI of LAD (3.5x23 Vision BMS); b. 05/2008 Cath/PCI: LM nl, LAD 46m, 80 ISR (3.5x28 Promus DES), LCX min irregs, RI nl;  c. 12/2009 MV: EF 61%, no ischemia.  . Diabetes mellitus   . Dyslipidemia   . Hypertension   . Hypertrophic cardiomyopathy (HCC)    a. 03/2014 Echo: EF 60-65%, Gr 1 DD, sev septal basal hypertrophy, no SAM but near mid-cavitary obstruction, mild MR.    Patient Active Problem List   Diagnosis Date Noted  . Syncope 05/21/2018  . Acute gastric ulcer with hemorrhage   . Heme + stool   . Decreased hemoglobin   . Palpitations 09/04/2017  . Abnormal EKG 02/06/2016    . Weakness 02/06/2016  . Hypertrophic cardiomyopathy (HCC)   . Dyslipidemia   . Hypertension   . Coronary artery disease   . Type II diabetes mellitus (HCC)   . Inguinal hernia 08/06/2013  . Preoperative cardiovascular examination 07/29/2013  . CAD (coronary artery disease) 02/26/2013  . INSECT BITE 03/07/2009  . ANEMIA, NORMOCYTIC 10/27/2008  . SYNCOPE 08/07/2007  . ECHOCARDIOGRAM, ABNORMAL 08/07/2007  . DIABETES MELLITUS, TYPE II, CONTROLLED 06/16/2007  . ELECTROCARDIOGRAM, ABNORMAL 03/28/2007  . DEGENERATIVE DISC DISEASE 01/01/2007  . ERECTILE DYSFUNCTION 12/27/2006  . Hyperlipidemia 09/06/2006  . Essential hypertension 09/06/2006  . CONSTIPATION 09/06/2006    Past Surgical History:  Procedure Laterality Date  . BACK SURGERY  1992  . CORONARY STENT PLACEMENT  06/03/2008   Promus stent to mid LAD; 10/23/2007 Vision stent to mid LAD  . ESOPHAGOGASTRODUODENOSCOPY N/A 09/06/2017   Procedure: ESOPHAGOGASTRODUODENOSCOPY (EGD);  Surgeon: Iva Boop, MD;  Location: [facility] ENDOSCOPY;  Service: Endoscopy;  Laterality: N/A;  . FEMORAL ENDARTERECTOMY  2010   right, with bovine patch angioplasty;  post cath, r./t aneursym/claudication  . INGUINAL HERNIA REPAIR Left 08/06/2013   Procedure: HERNIA REPAIR INGUINAL ADULT;  Surgeon: [name], MD;  Location: [facility] ORS;  Service: General;  Laterality: Left;  . TRANSTHORACIC ECHOCARDIOGRAM  01/12/2010   EF>55%; mild concentric LVH; mild MR, mild TR        Home Medications    Prior to  Admission medications   Medication Sig Start Date End Date Taking? Authorizing Provider  aspirin EC 81 MG tablet Take 81 mg by mouth daily.   Yes [provider]  carvedilol (COREG) 3.125 MG tablet TAKE ONE TABLET BY MOUTH TWICE DAILY 12/05/17  Yes Hilty, Lisette Abu, MD  ferrous sulfate 325 (65 FE) MG tablet Take 1 tablet (325 mg total) by mouth 2 (two) times daily with a meal. 09/06/17  Yes Berton Mount I, MD  fosinopril (MONOPRIL) 40 MG  tablet TAKE ONE (1) TABLET EACH DAY 03/31/18  Yes Hilty, Lisette Abu, MD  glipiZIDE (GLUCOTROL) 10 MG tablet Take 1 tablet by mouth daily. 07/05/17  Yes [provider]  metFORMIN (GLUCOPHAGE) 1000 MG tablet Take 1,000 mg by mouth 2 (two) times daily with a meal.     Yes [provider]  Multiple Vitamins-Minerals (MULTIVITAMIN PO) Take by mouth daily.   Yes [provider]  pantoprazole (PROTONIX) 40 MG tablet Take 1 tablet (40 mg total) by mouth daily before breakfast. 10/31/17  Yes Iva Boop, MD  rosuvastatin (CRESTOR) 20 MG tablet Take 1 tablet (20 mg total) by mouth daily. 12/06/16  Yes Hilty, Lisette Abu, MD    Family History Family History  Problem Relation Age of Onset  . Kidney disease Mother   . Heart Problems Mother   . Colon cancer Brother        >60, deceased from colon ca    Social History Social History   Tobacco Use  . Smoking status: Never Smoker  . Smokeless tobacco: Never Used  Substance Use Topics  . Alcohol use: No  . Drug use: No     Allergies   Patient has no known allergies.   Review of Systems Review of Systems  Constitutional: Negative for appetite change and fatigue.  HENT: Negative for congestion, ear discharge and sinus pressure.   Eyes: Negative for discharge.  Respiratory: Negative for cough.   Cardiovascular: Positive for syncope. Negative for chest pain.  Gastrointestinal: Negative for abdominal pain and diarrhea.  Genitourinary: Negative for frequency and hematuria.  Musculoskeletal: Negative for back pain.  Skin: Negative for rash.  Neurological: Positive for dizziness. Negative for seizures and headaches.  Psychiatric/Behavioral: Negative for hallucinations.     Physical Exam Updated Vital Signs BP 137/64   Pulse (!) 57   Temp 98.7 F (37.1 C)   Resp 20   Ht 5\' 11"  (1.803 m)   Wt 71.7 kg   SpO2 98%   BMI 22.04 kg/m   Physical Exam  Constitutional: He is oriented to person, place, and time. He  appears well-developed.  HENT:  Head: Normocephalic.  Eyes: Conjunctivae and EOM are normal. No scleral icterus.  Neck: Neck supple. No thyromegaly present.  Cardiovascular: Normal rate and regular rhythm. Exam reveals no gallop and no friction rub.  No murmur heard. Pulmonary/Chest: No stridor. He has no wheezes. He has no rales. He exhibits no tenderness.  Abdominal: He exhibits no distension. There is no tenderness. There is no rebound.  Musculoskeletal: Normal range of motion. He exhibits no edema.  Lymphadenopathy:    He has no cervical adenopathy.  Neurological: He is oriented to person, place, and time. He exhibits normal muscle tone. Coordination normal.  Skin: No rash noted. No erythema.  Psychiatric: He has a normal mood and affect. His behavior is normal.     ED Treatments / Results  Labs (all labs ordered are listed, but only abnormal results are displayed) Labs  Reviewed  BASIC METABOLIC PANEL - Abnormal; Notable for the following components:      Result Value   Glucose, Bld 260 (*)    Calcium 8.8 (*)    All other components within normal limits  CBC - Abnormal; Notable for the following components:   Hemoglobin 12.5 (*)    All other components within normal limits  URINALYSIS, ROUTINE W REFLEX MICROSCOPIC - Abnormal; Notable for the following components:   Glucose, UA >=500 (*)    All other components within normal limits  HEPATIC FUNCTION PANEL - Abnormal; Notable for the following components:   Total Protein 6.3 (*)    All other components within normal limits  CBG MONITORING, ED - Abnormal; Notable for the following components:   Glucose-Capillary 221 (*)    All other components within normal limits  TROPONIN I  DIFFERENTIAL    EKG EKG Interpretation  Date/Time:  Wednesday May 21 2018 09:22:39 EDT Ventricular Rate:  47 PR Interval:    QRS Duration: 103 QT Interval:  479 QTC Calculation: 424 R Axis:   -35 Text Interpretation:  Sinus bradycardia  Left axis deviation Abnrm T, consider ischemia, anterolateral lds Confirmed by Bethann Berkshire [id number]) on 05/21/2018 11:28:45 AM   Radiology Dg Chest 2 View  Result Date: 05/21/2018 CLINICAL DATA:  Syncopal episode this morning, bradycardia, head congestion and dizziness. Possible URI. EXAM: CHEST - 2 VIEW COMPARISON:  Chest x-ray of August 30, 2017 FINDINGS: The lungs are mildly hypoinflated. There are coarse lung markings in the retrocardiac region which are difficult to triangulate on the frontal radiograph. There is no pleural effusion. The heart and pulmonary vascularity are normal. The mediastinum is normal in width. The trachea is midline. There is calcification in the wall of the aortic arch. The bony thorax exhibits no acute abnormality. IMPRESSION: Subsegmental atelectasis or developing pneumonia in the left lower lobe. The findings are accentuated by mild hypoinflation. No CHF. Followup PA and lateral chest X-ray is recommended in 3-4 weeks following trial of antibiotic therapy to ensure resolution and exclude underlying malignancy. Thoracic aortic atherosclerosis. Electronically Signed   By: David  [place] M.D.   On: 05/21/2018 11:01   Ct Head Wo Contrast  Result Date: 05/21/2018 CLINICAL DATA:  Syncope EXAM: CT HEAD WITHOUT CONTRAST TECHNIQUE: Contiguous axial images were obtained from the base of the skull through the vertex without intravenous contrast. COMPARISON:  July 28, 2011 FINDINGS: Brain: There is age related volume loss. There is no intracranial mass, hemorrhage, extra-axial fluid collection, or midline shift. The brain parenchyma appears unremarkable. No acute infarct is evident. Vascular: There is no appreciable hyperdense vessel. There is calcification in each carotid siphon region. Skull: Bony calvarium appears intact. Sinuses/Orbits: There is mucosal thickening in each maxillary antrum. There is opacification and mucosal thickening involving multiple ethmoid air cells. There  is mild mucosal thickening in the medial left sphenoid sinus. Orbits appear symmetric bilaterally. Other: Mastoid air cells are clear. IMPRESSION: Areas of paranasal sinus disease.  Study otherwise unremarkable. Electronically Signed   By: Bretta Bang III M.D.   On: 05/21/2018 12:05    Procedures Procedures (including critical care time)  Medications Ordered in ED Medications  sodium chloride 0.9 % bolus 500 mL (0 mLs Intravenous Stopped 05/21/18 1126)     Initial Impression / Assessment and Plan / ED Course  I have reviewed the triage vital signs and the nursing notes.  Pertinent labs & imaging results that were available during my care of the patient were  reviewed by me and considered in my medical decision making (see chart for details).    With syncope and bradycardia.  I spoke with cardiology and they want to hold his beta-blocker and admit him to telemetry.  Cardiology will consult medicine will admit Final Clinical Impressions(s) / ED Diagnoses   Final diagnoses:  None    ED Discharge Orders    None       Bethann Berkshire, MD 05/21/18 1326

## 2018-05-21 NOTE — Consult Note (Addendum)
Cardiology Consult    Patient ID: [name] [name]; [id number]; 1946/06/10   Admit date: 05/21/2018 Date of Consult: 05/21/2018  Primary Care Provider: Jerlyn Ly, MD Primary Cardiologist: [name], MD   Patient Profile    [name] [name] is a 72 y.o. male with past medical history of CAD (s/p BMS to LAD in 10/2007, ISR with DES to LAD in 05/2008), hypertrophic cardiomyopathy, HTN, HLD, and Type 2 DM who is being seen today for the evaluation of syncope and bradycardia at the request of Dr. Estell Harpin.   History of Present Illness    [name] [name] last evaluated by Dr. Rennis Golden in 09/2017 for follow-up from a recent monitor which showed frequent PACs and PVCs.  He had continued to be short of breath and labs showed he was anemic with hemoglobin of 7.8 and he underwent work-up by GI which showed a bleeding ulcer which was subsequently treated.  He reports being in his usual state of health until a few days ago when he developed sinus congestion. He has been taking Mucinex DM with improvement in his symptoms. This morning, he felt back to baseline but upon going to [name] restaurant and sitting down to consume a biscuit, he had a presyncopal event. Unsure if he actually lost consciousness. Reports associated vision changes prior to this occurring but denies any chest pain, palpitations, dyspnea, or headaches. No seizure-like activity was witnessed. No episodes of bowel or bladder incontinence.  He now feels back to baseline and denies any symptoms. No recent chest pain or dyspnea on exertion over the past few weeks.  Initial labs show WBC 8.0, Hgb 12.5, platelets 189, Na+ 137, K+ 4.6, creatinine 0.99.  UA unrevealing.  Initial troponin negative. CXR showing subsegmental atelectasis or developing PNA in the left lower lobe with follow-up imaging recommended in 3 to 4 weeks to ensure resolution and exclude underlying malignancy. CT head showed areas of paranasal sinus disease with no acute  findings. EKG shows sinus bradycardia, HR 47, with TWI along inferior and lateral leads (similar to prior tracings from 12/15/14 but more pronounced than in September 16, 2017).   Past Medical History:  Diagnosis Date  . Bleeding acute gastric ulcer Sep 16, 2017  . Coronary artery disease    a. 10/2007 s/p PCI of LAD (3.5x23 Vision BMS); b. 05/2008 Cath/PCI: LM nl, LAD 18m, 80 ISR (3.5x28 Promus DES), LCX min irregs, RI nl;  c. 12/2009 MV: EF 61%, no ischemia.  . Diabetes mellitus   . Dyslipidemia   . Hypertension   . Hypertrophic cardiomyopathy (HCC)    a. 03/2014 Echo: EF 60-65%, Gr 1 DD, sev septal basal hypertrophy, no SAM but near mid-cavitary obstruction, mild MR.    Past Surgical History:  Procedure Laterality Date  . BACK SURGERY  1992  . CORONARY STENT PLACEMENT  06/03/2008   Promus stent to mid LAD; 10/23/2007 Vision stent to mid LAD  . ESOPHAGOGASTRODUODENOSCOPY N/A 09/06/2017   Procedure: ESOPHAGOGASTRODUODENOSCOPY (EGD);  Surgeon: Iva Boop, MD;  Location: [facility] ENDOSCOPY;  Service: Endoscopy;  Laterality: N/A;  . FEMORAL ENDARTERECTOMY  2010   right, with bovine patch angioplasty;  post cath, r./t aneursym/claudication  . INGUINAL HERNIA REPAIR Left 08/06/2013   Procedure: HERNIA REPAIR INGUINAL ADULT;  Surgeon: [name], MD;  Location: [facility] ORS;  Service: General;  Laterality: Left;  . TRANSTHORACIC ECHOCARDIOGRAM  01/12/2010   EF>55%; mild concentric LVH; mild MR, mild TR     Home Medications:  Prior to Admission medications  Medication Sig Start Date End Date Taking? Authorizing Provider  aspirin EC 81 MG tablet Take 81 mg by mouth daily.   Yes [provider]  carvedilol (COREG) 3.125 MG tablet TAKE ONE TABLET BY MOUTH TWICE DAILY 12/05/17  Yes Hilty, Lisette Abu, MD  ferrous sulfate 325 (65 FE) MG tablet Take 1 tablet (325 mg total) by mouth 2 (two) times daily with a meal. 09/06/17  Yes Berton Mount I, MD  fosinopril (MONOPRIL) 40 MG tablet TAKE ONE (1) TABLET EACH  DAY 03/31/18  Yes Hilty, Lisette Abu, MD  glipiZIDE (GLUCOTROL) 10 MG tablet Take 1 tablet by mouth daily. 07/05/17  Yes [provider]  metFORMIN (GLUCOPHAGE) 1000 MG tablet Take 1,000 mg by mouth 2 (two) times daily with a meal.     Yes [provider]  Multiple Vitamins-Minerals (MULTIVITAMIN PO) Take by mouth daily.   Yes [provider]  pantoprazole (PROTONIX) 40 MG tablet Take 1 tablet (40 mg total) by mouth daily before breakfast. 10/31/17  Yes Iva Boop, MD  rosuvastatin (CRESTOR) 20 MG tablet Take 1 tablet (20 mg total) by mouth daily. 12/06/16  Yes Hilty, Lisette Abu, MD    Inpatient Medications: Scheduled Meds:  Continuous Infusions:  PRN Meds:   Allergies:   No Known Allergies  Social History:   Social History   Socioeconomic History  . Marital status: Married    Spouse name: Not on file  . Number of children: 2  . Years of education: Not on file  . Highest education level: Not on file  Occupational History  . Occupation: retired  Engineer, production  . Financial resource strain: Not on file  . Food insecurity:    Worry: Not on file    Inability: Not on file  . Transportation needs:    Medical: Not on file    Non-medical: Not on file  Tobacco Use  . Smoking status: Never Smoker  . Smokeless tobacco: Never Used  Substance and Sexual Activity  . Alcohol use: No  . Drug use: No  . Sexual activity: Yes    Birth control/protection: None  Lifestyle  . Physical activity:    Days per week: Not on file    Minutes per session: Not on file  . Stress: Not on file  Relationships  . Social connections:    Talks on phone: Not on file    Gets together: Not on file    Attends religious service: Not on file    Active member of club or organization: Not on file    Attends meetings of clubs or organizations: Not on file    Relationship status: Not on file  . Intimate partner violence:    Fear of current or ex partner: Not on file    Emotionally  abused: Not on file    Physically abused: Not on file    Forced sexual activity: Not on file  Other Topics Concern  . Not on file  Social History Narrative   Retired and married 2 sons one is a Therapist, sports in [place]   No alcohol tobacco or drug use     Family History:    Family History  Problem Relation Age of Onset  . Kidney disease Mother   . Heart Problems Mother   . Colon cancer Brother        >60, deceased from colon ca      Review of Systems    General:  No chills, fever, night sweats  or weight changes.  Cardiovascular:  No chest pain, dyspnea on exertion, edema, orthopnea, palpitations, paroxysmal nocturnal dyspnea. Positive for presyncope.  Dermatological: No rash, lesions/masses Respiratory: No cough, dyspnea. Positive for sinus congestion.  Urologic: No hematuria, dysuria Abdominal:   No nausea, vomiting, diarrhea, bright red blood per rectum, melena, or hematemesis Neurologic:  No visual changes, wkns, changes in mental status.  All other systems reviewed and are otherwise negative except as noted above.  Physical Exam/Data    Vitals:   05/21/18 1030 05/21/18 1100 05/21/18 1130 05/21/18 1200  BP: (!) 125/59 135/62 (!) 141/59 (!) 129/57  Pulse: (!) 53 (!) 56 (!) 57 (!) 57  Resp: 16 (!) 23 14 18   Temp:      SpO2: 100% 99% 100% 99%  Weight:      Height:        Intake/Output Summary (Last 24 hours) at 05/21/2018 1227 Last data filed at 05/21/2018 0917 Gross per 24 hour  Intake 400 ml  Output -  Net 400 ml   Filed Weights   05/21/18 0924  Weight: 71.7 kg   Body mass index is 22.04 kg/m.   General: Pleasant, African American male appearing in NAD Psych: Normal affect. Neuro: Alert and oriented X 3. Moves all extremities spontaneously. HEENT: Normal  Neck: Supple without bruits or JVD. Lungs:  Resp regular and unlabored, CTA without wheezing or rales. Heart: Regular rhythm, bradycardiac rate, no s3, s4, or murmurs. Abdomen: Soft,  non-tender, non-distended, BS + x 4.  Extremities: No clubbing, cyanosis or lower extremity edema. DP/PT/Radials 2+ and equal bilaterally.   Labs/Studies     Relevant CV Studies:  NST: 01/2016  The left ventricular ejection fraction is normal (55-65%).  Nuclear stress EF: 55%.  There was no ST segment deviation noted during stress.  The study is normal.   Normal stress nuclear study with no ischemia or infarction; EF 55 with normal wall motion.  Echocardiogram: 08/2017 Study Conclusions  - Left ventricle: The cavity size was normal. Wall thickness was   increased in a pattern of moderate LVH. Systolic function was   vigorous. The estimated ejection fraction was in the range of 65%   to 70%. - Mitral valve: Nodular calcification of anterior leaflet. - Atrial septum: Poorly visualized likely atrial septal aneurysm - Impressions: No SAM possible small mid cavitary gradient  Impressions:  - No SAM possible small mid cavitary gradient  Event Monitor: 09/2017 Essentially normal study. No extrasystoles or arrhythmias noted to explain his palpitations or weakness.  Laboratory Data:  Chemistry Recent Labs  Lab 05/21/18 0951  NA 137  K 4.6  CL 102  CO2 27  GLUCOSE 260*  BUN 13  CREATININE 0.99  CALCIUM 8.8*  GFRNONAA >60  GFRAA >60  ANIONGAP 8    Recent Labs  Lab 05/21/18 0951  PROT 6.3*  ALBUMIN 3.7  AST 20  ALT 16  ALKPHOS 68  BILITOT 0.9   Hematology Recent Labs  Lab 05/21/18 0951  WBC 8.0  RBC 4.29  HGB 12.5*  HCT 39.3  MCV 91.6  MCH 29.1  MCHC 31.8  RDW 12.7  PLT 189   Cardiac Enzymes Recent Labs  Lab 05/21/18 0951  TROPONINI <0.03   No results for input(s): TROPIPOC in the last 168 hours.  BNPNo results for input(s): BNP, PROBNP in the last 168 hours.  DDimer No results for input(s): DDIMER in the last 168 hours.  Radiology/Studies:  Dg Chest 2 View  Result Date: 05/21/2018 CLINICAL  DATA:  Syncopal episode this morning,  bradycardia, head congestion and dizziness. Possible URI. EXAM: CHEST - 2 VIEW COMPARISON:  Chest x-ray of August 30, 2017 FINDINGS: The lungs are mildly hypoinflated. There are coarse lung markings in the retrocardiac region which are difficult to triangulate on the frontal radiograph. There is no pleural effusion. The heart and pulmonary vascularity are normal. The mediastinum is normal in width. The trachea is midline. There is calcification in the wall of the aortic arch. The bony thorax exhibits no acute abnormality. IMPRESSION: Subsegmental atelectasis or developing pneumonia in the left lower lobe. The findings are accentuated by mild hypoinflation. No CHF. Followup PA and lateral chest X-ray is recommended in 3-4 weeks following trial of antibiotic therapy to ensure resolution and exclude underlying malignancy. Thoracic aortic atherosclerosis. Electronically Signed   By: David  Swaziland M.D.   On: 05/21/2018 11:01   Ct Head Wo Contrast  Result Date: 05/21/2018 CLINICAL DATA:  Syncope EXAM: CT HEAD WITHOUT CONTRAST TECHNIQUE: Contiguous axial images were obtained from the base of the skull through the vertex without intravenous contrast. COMPARISON:  July 28, 2011 FINDINGS: Brain: There is age related volume loss. There is no intracranial mass, hemorrhage, extra-axial fluid collection, or midline shift. The brain parenchyma appears unremarkable. No acute infarct is evident. Vascular: There is no appreciable hyperdense vessel. There is calcification in each carotid siphon region. Skull: Bony calvarium appears intact. Sinuses/Orbits: There is mucosal thickening in each maxillary antrum. There is opacification and mucosal thickening involving multiple ethmoid air cells. There is mild mucosal thickening in the medial left sphenoid sinus. Orbits appear symmetric bilaterally. Other: Mastoid air cells are clear. IMPRESSION: Areas of paranasal sinus disease.  Study otherwise unremarkable. Electronically Signed    By: Bretta Bang III M.D.   On: 05/21/2018 12:05     Assessment & Plan    1. Presyncope - unclear etiology, patient did present bradycardic. Hold coreg, follow rates on telemetry.    2. CAD - s/p BMS to LAD in 10/2007 with ISR and DES to LAD in 05/2008.  -Recent ischemic evaluation was an NST in 01/2016 which was low risk. Echocardiogram earlier this year showed a preserved EF with no regional wall motion abnormalities. He denies any recent chest pain or dyspnea on exertion.  Would plan to obtain cyclic cardiac enzymes in the setting of his presyncope.  3. Hypertrophic Cardiomyopathy - Mentioned by prior reports.  Echocardiogram earlier this year showed moderate LVH, EF 65 to 70%, and no SAM but a possible small mid cavitary gradient.   For questions or updates, please contact [facility] Please consult [email or web address] for contact info under Cardiology/STEMI.  Signed, Ellsworth Lennox, PA-C 05/21/2018, 12:27 PM Pager: [phone]  Patient seen and discussed with PA Iran Ouch, I have edited her documentation above and agree with the provided information. 72 yo male history of CAD with LAD stent in 2009, HTN, DM2, HL, mild asymmetric septal hypertrophy, history of PUD with prior bleeding ulcer admitted with presyncope   Reports feeling fine over the last few days. This morning while setting at breakfast suddenly fell weak and fell to the floor. He does not believe he totally loss consciousness. Denies any chest pain, no SOB or palpitations. After episode felt a little groggy but otherwise ok. No seizure like activity reported. No recent change in oral intake, no recent N/VD.   ER vitals p 50 bp 152/61 98% RA Cr 0.99 BUN 13 WBC 8 Hgb 12.5 Plt 189  Trop neg CXR  subsegmental atelectsis vs infiltrate.  CT head no acute process EKG sinus brady 47, nonspeciric ST/T chagnes  Patient presents with presycnope, unclear etiology. Orthostatics are negative in the ER. Sinus brady into  40s, unclear if related to his episode. We will d/c his coreg, check TSH. Monitor on telemetry overnight. 08/2017 14 day event monitor benign, some bradycardia mainly in AM but average rates in 70s. Echos have shown some mild to moderate asymmetric septal hypertrophy but has never really had a significant gradient by echo, don't this is related. Don't see strong reason to repeat echo at this time, if recurrent syncope could consider echo with provocative maneuvers to see if gradient can be provoked. At this time most likely etiology is bradycardia induced by his home beta blocker.   Abnormal CXR per primary team.    Dominga Ferry DM

## 2018-05-22 ENCOUNTER — Observation Stay (HOSPITAL_COMMUNITY): Payer: Medicare Other

## 2018-05-22 DIAGNOSIS — R42 Dizziness and giddiness: Secondary | ICD-10-CM

## 2018-05-22 DIAGNOSIS — I2510 Atherosclerotic heart disease of native coronary artery without angina pectoris: Secondary | ICD-10-CM | POA: Diagnosis not present

## 2018-05-22 DIAGNOSIS — E782 Mixed hyperlipidemia: Secondary | ICD-10-CM | POA: Diagnosis not present

## 2018-05-22 DIAGNOSIS — I10 Essential (primary) hypertension: Secondary | ICD-10-CM | POA: Diagnosis not present

## 2018-05-22 DIAGNOSIS — R55 Syncope and collapse: Secondary | ICD-10-CM | POA: Diagnosis not present

## 2018-05-22 LAB — BASIC METABOLIC PANEL
Anion gap: 7 (ref 5–15)
BUN: 12 mg/dL (ref 8–23)
CALCIUM: 8.6 mg/dL — AB (ref 8.9–10.3)
CO2: 26 mmol/L (ref 22–32)
CREATININE: 0.82 mg/dL (ref 0.61–1.24)
Chloride: 107 mmol/L (ref 98–111)
GFR calc non Af Amer: >60 mL/min (ref >60)
GLUCOSE: 161 mg/dL — AB (ref 70–99)
Potassium: 3.8 mmol/L (ref 3.5–5.1)
Sodium: 140 mmol/L (ref 135–145)

## 2018-05-22 LAB — CBC
HEMATOCRIT: 35.4 % — AB (ref 39.0–52.0)
Hemoglobin: 11.4 g/dL — ABNORMAL LOW (ref 13.0–17.0)
MCH: 29.2 pg (ref 26.0–34.0)
MCHC: 32.2 g/dL (ref 30.0–36.0)
MCV: 90.8 fL (ref 80.0–100.0)
Platelets: 193 10*3/uL (ref 150–400)
RBC: 3.9 MIL/uL — ABNORMAL LOW (ref 4.22–5.81)
RDW: 12.6 % (ref 11.5–15.5)
WBC: 5.6 10*3/uL (ref 4.0–10.5)
nRBC: 0 % (ref 0.0–0.2)

## 2018-05-22 LAB — GLUCOSE, CAPILLARY
GLUCOSE-CAPILLARY: 179 mg/dL — AB (ref 70–99)
GLUCOSE-CAPILLARY: 167 mg/dL — AB (ref 70–99)
Glucose-Capillary: 166 mg/dL — ABNORMAL HIGH (ref 70–99)

## 2018-05-22 LAB — TROPONIN I
Troponin I: <0.03 ng/mL (ref <0.03)
Troponin I: <0.03 ng/mL (ref <0.03)

## 2018-05-22 MED ORDER — LISINOPRIL 10 MG PO TABS
40.0000 mg | ORAL_TABLET | Freq: Every day | ORAL | Status: DC
  Administered 2018-05-22: 40 mg via ORAL
  Filled 2018-05-22: qty 4

## 2018-05-22 NOTE — Progress Notes (Signed)
Progress Note  Patient Name: [name] [name] Date of Encounter: 05/22/2018  Primary Cardiologist: [name], MD   Subjective   No complaints  Inpatient Medications    Scheduled Meds: . aspirin EC  81 mg Oral Daily  . enoxaparin (LOVENOX) injection  40 mg Subcutaneous Q24H  . fosinopril  40 mg Oral Daily  . glipiZIDE  10 mg Oral Daily  . insulin aspart  0-15 Units Subcutaneous TID WC  . insulin aspart  0-5 Units Subcutaneous QHS  . pantoprazole  40 mg Oral QAC breakfast  . rosuvastatin  20 mg Oral Daily  . sodium chloride flush  3 mL Intravenous Q12H   Continuous Infusions: . sodium chloride 75 mL/hr at 05/22/18 0640   PRN Meds: acetaminophen **OR** acetaminophen, ondansetron **OR** ondansetron (ZOFRAN) IV   Vital Signs    Vitals:   05/21/18 1953 05/21/18 2145 05/22/18 0139 05/22/18 0536  BP:  (!) 163/71 (!) 158/67 (!) 146/68  Pulse:  (!) 53 (!) 52 (!) 52  Resp:  18 18 18   Temp:  98.9 F (37.2 C) 98.5 F (36.9 C) 98.3 F (36.8 C)  TempSrc:  Oral Oral Oral  SpO2: 93% 97% 97% 99%  Weight:    68.4 kg  Height:        Intake/Output Summary (Last 24 hours) at 05/22/2018 1031 Last data filed at 05/22/2018 1010 Gross per 24 hour  Intake 1300.47 ml  Output 500 ml  Net 800.47 ml   Filed Weights   05/21/18 0924 05/21/18 1745 05/22/18 0536  Weight: 71.7 kg 68 kg 68.4 kg    Telemetry    Sinus high 50s to 60s - Personally Reviewed  ECG    na  Physical Exam   GEN: No acute distress.   Neck: No JVD Cardiac: RRR, 2/6 systolic murmur rusb, no rubs, or gallops.  Respiratory: Clear to auscultation bilaterally. GI: Soft, nontender, non-distended  MS: No edema; No deformity. Neuro:  Nonfocal  Psych: Normal affect   Labs    Chemistry Recent Labs  Lab 05/21/18 0951 05/22/18 0521  NA 137 140  K 4.6 3.8  CL 102 107  CO2 27 26  GLUCOSE 260* 161*  BUN 13 12  CREATININE 0.99 0.82  CALCIUM 8.8* 8.6*  PROT 6.3*  --   ALBUMIN 3.7  --   AST 20  --    ALT 16  --   ALKPHOS 68  --   BILITOT 0.9  --   GFRNONAA >60 >60  GFRAA >60 >60  ANIONGAP 8 7     Hematology Recent Labs  Lab 05/21/18 0951 05/22/18 0521  WBC 8.0 5.6  RBC 4.29 3.90*  HGB 12.5* 11.4*  HCT 39.3 35.4*  MCV 91.6 90.8  MCH 29.1 29.2  MCHC 31.8 32.2  RDW 12.7 12.6  PLT 189 193    Cardiac Enzymes Recent Labs  Lab 05/21/18 0951 05/21/18 1848 05/21/18 2328 05/22/18 0521  TROPONINI <0.03 <0.03 <0.03 <0.03   No results for input(s): TROPIPOC in the last 168 hours.   BNPNo results for input(s): BNP, PROBNP in the last 168 hours.   DDimer No results for input(s): DDIMER in the last 168 hours.   Radiology    Dg Chest 2 View  Result Date: 05/21/2018 CLINICAL DATA:  Syncopal episode this morning, bradycardia, head congestion and dizziness. Possible URI. EXAM: CHEST - 2 VIEW COMPARISON:  Chest x-ray of August 30, 2017 FINDINGS: The lungs are mildly hypoinflated. There are coarse lung markings in  the retrocardiac region which are difficult to triangulate on the frontal radiograph. There is no pleural effusion. The heart and pulmonary vascularity are normal. The mediastinum is normal in width. The trachea is midline. There is calcification in the wall of the aortic arch. The bony thorax exhibits no acute abnormality. IMPRESSION: Subsegmental atelectasis or developing pneumonia in the left lower lobe. The findings are accentuated by mild hypoinflation. No CHF. Followup PA and lateral chest X-ray is recommended in 3-4 weeks following trial of antibiotic therapy to ensure resolution and exclude underlying malignancy. Thoracic aortic atherosclerosis. Electronically Signed   By: David  [place]iland M.D.   On: 05/21/2018 11:01   Ct Head Wo Contrast  Result Date: 05/21/2018 CLINICAL DATA:  Syncope EXAM: CT HEAD WITHOUT CONTRAST TECHNIQUE: Contiguous axial images were obtained from the base of the skull through the vertex without intravenous contrast. COMPARISON:  July 28, 2011  FINDINGS: Brain: There is age related volume loss. There is no intracranial mass, hemorrhage, extra-axial fluid collection, or midline shift. The brain parenchyma appears unremarkable. No acute infarct is evident. Vascular: There is no appreciable hyperdense vessel. There is calcification in each carotid siphon region. Skull: Bony calvarium appears intact. Sinuses/Orbits: There is mucosal thickening in each maxillary antrum. There is opacification and mucosal thickening involving multiple ethmoid air cells. There is mild mucosal thickening in the medial left sphenoid sinus. Orbits appear symmetric bilaterally. Other: Mastoid air cells are clear. IMPRESSION: Areas of paranasal sinus disease.  Study otherwise unremarkable. Electronically Signed   By: Bretta Bang III M.D.   On: 05/21/2018 12:05    Cardiac Studies    Patient Profile     [name] [name] is a 72 y.o. male with past medical history of CAD (s/p BMS to LAD in 10/2007, ISR with DES to LAD in 05/2008), hypertrophic cardiomyopathy, HTN, HLD, and Type 2 DM who is being seen today for the evaluation of syncope and bradycardia at the request of Dr. Estell Harpin.   Assessment & Plan    1. Dizziness/Presyncope - Patient presents with presycnope, unclear etiology. Orthostatics are negative in the ER. Sinus brady into 40s, unclear if related to his episode - coreg has been stopped, last dose was yesterday morning. Heart rates high 50s to 60s, no significant arrhythmias on tele - TSH is normal - carotid US pending - Echos have shown some mild to moderate asymmetric septal hypertrophy but has never really had a significant gradient by echo, don't this is related. Don't see strong reason to repeat echo at this time, if recurrent syncope could consider echo with provocative maneuvers to see if gradient can be provoked   Ok for discharge. If recurrent episode would plan for repeat cardiac monitor. If benign consider echo with provocative maneuvers to  evaluate for dynamic gradient.   [facility] will sign off.   Medication Recommendations:  Stop coreg Other recommendations (labs, testing, etc):  none Follow up as an outpatient:  We will arrange with Dr Rennis Golden or his PA   For questions or updates, please contact [facility] Please consult [email or web address] for contact info under        Signed, Dina Rich, MD  05/22/2018, 10:31 AM

## 2018-05-22 NOTE — Care Management Obs Status (Signed)
MEDICARE OBSERVATION STATUS NOTIFICATION   Patient Details  Name: [name] [name] MRN: [id number] Date of Birth: May 16, 1946   Medicare Observation Status Notification Given:  Yes    Shandrea Lusk, Chrystine Oiler, RN 05/22/2018, 7:51 AM

## 2018-05-22 NOTE — Progress Notes (Signed)
IV removed, patient tolerated well. Review AVS with patient who verbalized understanding.  Patient transport home by his wife.

## 2018-05-22 NOTE — Discharge Summary (Signed)
Physician Discharge Summary  [name] [name] YQM:[id number] DOB: 01/08/1946 DOA: 05/21/2018  PCP: Jerlyn Ly, MD  Admit date: 05/21/2018 Discharge date: 05/22/2018  Admitted From: home Disposition:  home  Recommendations for Outpatient Follow-up:  1. Follow up with PCP in 1-2 weeks 2. Please obtain BMP/CBC in one week 3. Follow-up with cardiology will be scheduled   Discharge Condition: Stable CODE STATUS: Full code Diet recommendation: Heart healthy, carb modified  Brief/Interim Summary: 72 year old male with history of coronary artery disease, diabetes, hyperlipidemia, hypertension, admitted to the hospital with presyncope.  Patient was sitting in a restaurant when he suddenly began to feel very lightheaded.  He fell to the floor, but denies any loss of consciousness.  On vital check, he was noted to be bradycardic with a heart rate in the 40s.  He was brought to the hospital for evaluation.  He is currently on a low-dose beta-blocker.  This was discontinued.  TSH was noted to be normal.  He was seen by cardiology and he ruled out for ACS with negative cardiac markers.  Recommendations from cardiology were to continue to hold beta-blocker.  He will be scheduled for further outpatient follow-up.  At this point, he feels significantly better.  He has not had any further symptoms.  Carotid Dopplers were also checked and noted to be unremarkable.  Patient is ready for discharge home.  Discharge Diagnoses:  Active Problems:   Hyperlipidemia   Essential hypertension   CAD (coronary artery disease)   Type II diabetes mellitus (HCC)   Syncope    Discharge Instructions  Discharge Instructions    Diet - low sodium heart healthy   Complete by:  As directed    Increase activity slowly   Complete by:  As directed      Allergies as of 05/22/2018   No Known Allergies     Medication List    STOP taking these medications   carvedilol 3.125 MG tablet Commonly known as:  COREG      TAKE these medications   aspirin EC 81 MG tablet Take 81 mg by mouth daily.   ferrous sulfate 325 (65 FE) MG tablet Take 1 tablet (325 mg total) by mouth 2 (two) times daily with a meal.   fosinopril 40 MG tablet Commonly known as:  MONOPRIL TAKE ONE (1) TABLET EACH DAY   glipiZIDE 10 MG tablet Commonly known as:  GLUCOTROL Take 1 tablet by mouth daily.   metFORMIN 1000 MG tablet Commonly known as:  GLUCOPHAGE Take 1,000 mg by mouth 2 (two) times daily with a meal.   MULTIVITAMIN PO Take by mouth daily.   pantoprazole 40 MG tablet Commonly known as:  PROTONIX Take 1 tablet (40 mg total) by mouth daily before breakfast.   rosuvastatin 20 MG tablet Commonly known as:  CRESTOR Take 1 tablet (20 mg total) by mouth daily.      Follow-up Information    [name], MD On 05/30/2018.   Specialty:  Cardiology Why:  at 9:00 am Contact information: [address] [place] [address] [place] [address] [phone]          No Known Allergies  Consultations:  Cardiology   Procedures/Studies: Dg Chest 2 View  Result Date: 05/21/2018 CLINICAL DATA:  Syncopal episode this morning, bradycardia, head congestion and dizziness. Possible URI. EXAM: CHEST - 2 VIEW COMPARISON:  Chest x-ray of August 30, 2017 FINDINGS: The lungs are mildly hypoinflated. There are coarse lung markings in the retrocardiac region which are difficult  to triangulate on the frontal radiograph. There is no pleural effusion. The heart and pulmonary vascularity are normal. The mediastinum is normal in width. The trachea is midline. There is calcification in the wall of the aortic arch. The bony thorax exhibits no acute abnormality. IMPRESSION: Subsegmental atelectasis or developing pneumonia in the left lower lobe. The findings are accentuated by mild hypoinflation. No CHF. Followup PA and lateral chest X-ray is recommended in 3-4 weeks following trial of antibiotic therapy to ensure resolution and  exclude underlying malignancy. Thoracic aortic atherosclerosis. Electronically Signed   By: David  [place] M.D.   On: 05/21/2018 11:01   Ct Head Wo Contrast  Result Date: 05/21/2018 CLINICAL DATA:  Syncope EXAM: CT HEAD WITHOUT CONTRAST TECHNIQUE: Contiguous axial images were obtained from the base of the skull through the vertex without intravenous contrast. COMPARISON:  July 28, 2011 FINDINGS: Brain: There is age related volume loss. There is no intracranial mass, hemorrhage, extra-axial fluid collection, or midline shift. The brain parenchyma appears unremarkable. No acute infarct is evident. Vascular: There is no appreciable hyperdense vessel. There is calcification in each carotid siphon region. Skull: Bony calvarium appears intact. Sinuses/Orbits: There is mucosal thickening in each maxillary antrum. There is opacification and mucosal thickening involving multiple ethmoid air cells. There is mild mucosal thickening in the medial left sphenoid sinus. Orbits appear symmetric bilaterally. Other: Mastoid air cells are clear. IMPRESSION: Areas of paranasal sinus disease.  Study otherwise unremarkable. Electronically Signed   By: Bretta Bang III M.D.   On: 05/21/2018 12:05   US Carotid Bilateral  Result Date: 05/22/2018 CLINICAL DATA:  Hypertension, syncope, hyperlipidemia and diabetes. Carotid atherosclerosis. EXAM: BILATERAL CAROTID DUPLEX ULTRASOUND TECHNIQUE: Wallace Cullens scale imaging, color Doppler and duplex ultrasound were performed of bilateral carotid and vertebral arteries in the neck. COMPARISON:  None. FINDINGS: Criteria: Quantification of carotid stenosis is based on velocity parameters that correlate the residual internal carotid diameter with NASCET-based stenosis levels, using the diameter of the distal internal carotid lumen as the denominator for stenosis measurement. The following velocity measurements were obtained: RIGHT ICA: 205/51 cm/sec CCA: 91/19 cm/sec SYSTOLIC ICA/CCA RATIO:  2.3  ECA: 131 cm/sec LEFT ICA: 202/56 cm/sec CCA: 97/22 cm/sec SYSTOLIC ICA/CCA RATIO:  2.1 ECA: 263 cm/sec RIGHT CAROTID ARTERY: Moderate heterogeneous right carotid intimal thickening and atherosclerosis. This results in distal ICA velocity elevation measuring 205/51 centimeters/second. Turbulent flow noted. Degree of stenosis estimated at 50-69% by ultrasound criteria. RIGHT VERTEBRAL ARTERY:  Antegrade LEFT CAROTID ARTERY: Similar moderate left carotid intimal thickening and atherosclerosis. Left distal ICA velocity elevation measures 202/56 centimeters/second mild turbulent flow. Degree of stenosis also estimated at 50-69% by ultrasound criteria. LEFT VERTEBRAL ARTERY:  Antegrade IMPRESSION: Moderate bilateral carotid atherosclerosis. Moderate bilateral ICA stenoses estimated at 50-69% by ultrasound criteria Patent antegrade vertebral flow bilaterally Electronically Signed   By: Judie Petit.  Shick M.D.   On: 05/22/2018 12:08       Subjective:  Patient is feeling better. No shortness of breath or chest pain  Discharge Exam: Vitals:   05/21/18 1953 05/21/18 2145 05/22/18 0139 05/22/18 0536  BP:  (!) 163/71 (!) 158/67 (!) 146/68  Pulse:  (!) 53 (!) 52 (!) 52  Resp:  18 18 18   Temp:  98.9 F (37.2 C) 98.5 F (36.9 C) 98.3 F (36.8 C)  TempSrc:  Oral Oral Oral  SpO2: 93% 97% 97% 99%  Weight:    68.4 kg  Height:        General: Pt is alert, awake, not  in acute distress Cardiovascular: RRR, S1/S2 +, no rubs, no gallops Respiratory: CTA bilaterally, no wheezing, no rhonchi Abdominal: Soft, NT, ND, bowel sounds + Extremities: no edema, no cyanosis    The results of significant diagnostics from this hospitalization (including imaging, microbiology, ancillary and laboratory) are listed below for reference.     Microbiology: No results found for this or any previous visit (from the past 240 hour(s)).   Labs: BNP (last 3 results) No results for input(s): BNP in the last 8760 hours. Basic  Metabolic Panel: Recent Labs  Lab 05/21/18 0951 05/22/18 0521  NA 137 140  K 4.6 3.8  CL 102 107  CO2 27 26  GLUCOSE 260* 161*  BUN 13 12  CREATININE 0.99 0.82  CALCIUM 8.8* 8.6*   Liver Function Tests: Recent Labs  Lab 05/21/18 0951  AST 20  ALT 16  ALKPHOS 68  BILITOT 0.9  PROT 6.3*  ALBUMIN 3.7   No results for input(s): LIPASE, AMYLASE in the last 168 hours. No results for input(s): AMMONIA in the last 168 hours. CBC: Recent Labs  Lab 05/21/18 0951 05/22/18 0521  WBC 8.0 5.6  NEUTROABS 6.0  --   HGB 12.5* 11.4*  HCT 39.3 35.4*  MCV 91.6 90.8  PLT 189 193   Cardiac Enzymes: Recent Labs  Lab 05/21/18 0951 05/21/18 1848 05/21/18 2328 05/22/18 0521  TROPONINI <0.03 <0.03 <0.03 <0.03   BNP: Invalid input(s): POCBNP CBG: Recent Labs  Lab 05/21/18 0929 05/21/18 2056 05/22/18 0638 05/22/18 0812 05/22/18 1150  GLUCAP 221* 189* 166* 179* 167*   D-Dimer No results for input(s): DDIMER in the last 72 hours. Hgb A1c No results for input(s): HGBA1C in the last 72 hours. Lipid Profile No results for input(s): CHOL, HDL, LDLCALC, TRIG, CHOLHDL, LDLDIRECT in the last 72 hours. Thyroid function studies Recent Labs    05/21/18 1559  TSH 1.758   Anemia work up No results for input(s): VITAMINB12, FOLATE, FERRITIN, TIBC, IRON, RETICCTPCT in the last 72 hours. Urinalysis    Component Value Date/Time   COLORURINE YELLOW 05/21/2018 0926   APPEARANCEUR CLEAR 05/21/2018 0926   LABSPEC 1.015 05/21/2018 0926   PHURINE 5.0 05/21/2018 0926   GLUCOSEU >=500 (A) 05/21/2018 0926   HGBUR NEGATIVE 05/21/2018 0926   HGBUR negative 09/23/2007 0907   BILIRUBINUR NEGATIVE 05/21/2018 0926   KETONESUR NEGATIVE 05/21/2018 0926   PROTEINUR NEGATIVE 05/21/2018 0926   UROBILINOGEN 0.2 07/28/2011 1456   NITRITE NEGATIVE 05/21/2018 0926   LEUKOCYTESUR NEGATIVE 05/21/2018 0926   Sepsis Labs Invalid input(s): PROCALCITONIN,  WBC,  LACTICIDVEN Microbiology No results  found for this or any previous visit (from the past 240 hour(s)).   Time coordinating discharge:  SIGNED:   Erick Blinks, MD  [facility] 05/22/2018, 2:01 PM Pager   If 7PM-7AM, please contact night-coverage [email or web address] Password [email or web address]

## 2018-05-30 ENCOUNTER — Encounter: Payer: Self-pay | Admitting: Internal Medicine

## 2018-05-30 ENCOUNTER — Ambulatory Visit: Payer: Medicare Other | Admitting: Internal Medicine

## 2018-05-30 VITALS — BP 159/81 | HR 66 | Ht 71.5 in | Wt 152.6 lb

## 2018-05-30 DIAGNOSIS — R001 Bradycardia, unspecified: Secondary | ICD-10-CM

## 2018-05-30 DIAGNOSIS — I6523 Occlusion and stenosis of bilateral carotid arteries: Secondary | ICD-10-CM

## 2018-05-30 DIAGNOSIS — R55 Syncope and collapse: Secondary | ICD-10-CM

## 2018-05-30 DIAGNOSIS — E782 Mixed hyperlipidemia: Secondary | ICD-10-CM

## 2018-05-30 DIAGNOSIS — I2510 Atherosclerotic heart disease of native coronary artery without angina pectoris: Secondary | ICD-10-CM | POA: Diagnosis not present

## 2018-05-30 LAB — LIPID PANEL
CHOL/HDL RATIO: 3.3 ratio (ref 0.0–5.0)
Cholesterol, Total: 193 mg/dL (ref 100–199)
HDL: 59 mg/dL (ref >39)
LDL Calculated: 125 mg/dL — ABNORMAL HIGH (ref 0–99)
TRIGLYCERIDES: 45 mg/dL (ref 0–149)
VLDL Cholesterol Cal: 9 mg/dL (ref 5–40)

## 2018-05-30 IMAGING — NM NM MISC PROCEDURE
6 series · 36 of 36 positions shown · non-contrast
Comparison: none

[Series 1: wbr rest · 6.40mm/px · 6 of 64 frames shown]
[frame 6/64]
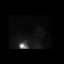
[frame 16/64]
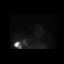
[frame 27/64]
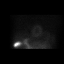
[frame 38/64]
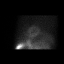
[frame 48/64]
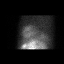
[frame 59/64]
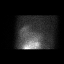

[Series 1: wbr_r-proj_st wbr rest · 6.40mm/px · 6 of 64 frames shown]
[frame 6/64]
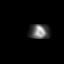
[frame 16/64]
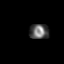
[frame 27/64]
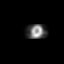
[frame 38/64]
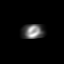
[frame 48/64]
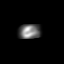
[frame 59/64]
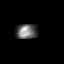

[Series 2: wbr stress-gsp · 6.40mm/px · 6 of 512 frames shown]
[frame 43/512]
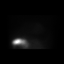
[frame 128/512]
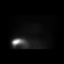
[frame 214/512]
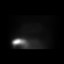
[frame 299/512]
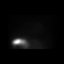
[frame 384/512]
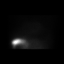
[frame 470/512]
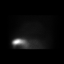

[Series 2: wbr_s-proj_st wbr stress-gsp · 6.40mm/px · 6 of 512 frames shown]
[frame 43/512]
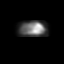
[frame 128/512]
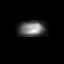
[frame 214/512]
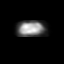
[frame 299/512]
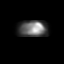
[frame 384/512]
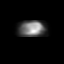
[frame 470/512]
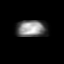

[Series 3: wbr_s-proj_st wbr stress-sum-em · 6.40mm/px · 6 of 64 frames shown]
[frame 6/64]
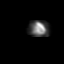
[frame 16/64]
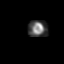
[frame 27/64]
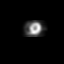
[frame 38/64]
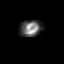
[frame 48/64]
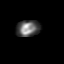
[frame 59/64]
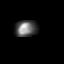

[Series 3: wbr stress-sum-em · 6.40mm/px · 6 of 64 frames shown]
[frame 6/64]
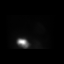
[frame 16/64]
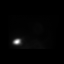
[frame 27/64]
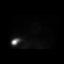
[frame 38/64]
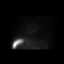
[frame 48/64]
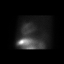
[frame 59/64]
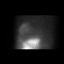

[36 of 36 positions shown; findings below may reference images not displayed]

Canned report from images found in remote index.

Refer to host system for actual result text.

## 2018-05-30 MED ORDER — ROSUVASTATIN CALCIUM 20 MG PO TABS: 20.0000 mg | ORAL_TABLET | Freq: Every day | ORAL | 6 refills | Status: DC

## 2018-05-30 MED ORDER — AMLODIPINE BESYLATE 5 MG PO TABS: 5.0000 mg | ORAL_TABLET | Freq: Every day | ORAL | 4 refills | Status: DC

## 2018-05-30 NOTE — Patient Instructions (Signed)
Medication Instructions:  START Norvasc 5mg  Take 1 tablet once day TAKE YOUR CRESTOR EVERY DAY If you need a refill on your cardiac medications before your next appointment, please call your pharmacy.   Lab work: Your physician recommends that you return for lab work in: TODAY-LIPID If you have labs (blood work) drawn today and your tests are completely normal, you will receive your results only by: Marland Kitchen MyChart Message (if you have MyChart) OR . A paper copy in the mail If you have any lab test that is abnormal or we need to change your treatment, we will call you to review the results.  Testing/Procedures: NONE   Follow-Up: At [facility], you and your health needs are our priority.  As part of our continuing mission to provide you with exceptional heart care, we have created designated Provider Care Teams.  These Care Teams include your primary Cardiologist (physician) and Advanced Practice Providers (APPs -  Physician Assistants and Nurse Practitioners) who all work together to provide you with the care you need, when you need it. You will need a follow up appointment in 3-4 months. You may see [name], MD or one of the following Advanced Practice Providers on your designated Care Team: [place], [place] . Micah Flesher, PA-C  Any Other Special Instructions Will Be Listed Below (If Applicable).

## 2018-05-30 NOTE — Progress Notes (Signed)
OFFICE NOTE  Chief Complaint:  Follow-up presyncope  Primary Care Physician: Jerlyn Ly, MD  HPI:  [name] [name] a 72 year old gentleman with a history of coronary artery disease, status post stent to mid LAD in 2009. 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 He also has hypertension, diabetes, and dyslipidemia. All these have been well controlled. 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  He recently underwent hernia repair surgery which was successful. He says has been doing well without any difficulty. He denies any chest pain or worsening shortness of breath. He does have a loud systolic murmur which was previously noted and does have a history of mild asymmetric septal hypertrophy on echo in 2011. He recent had lab work through his primary care provider and reported that the cholesterol and blood glucose are within normal limits. We are awaiting those results.  I saw [name] [name] back in the office today. Overall he is doing well except for some mild swelling of the lower extremities. Blood pressure is well-controlled today. He's had no syncopal events. He does have a systolic murmur with asymmetric septal hypertrophy without outflow tract obstruction. This was looked at with echo last year and does not seem to worsen significantly. He denies any chest pain associated with his prior coronary artery disease and had a stent to the mid LAD in 2009.  02/06/2016  [name] [name] returns today for follow-up. He continues to work for the [employer]. Recently he's had no worsening shortness of breath or chest discomfort but he does not exercise regularly. When asked about energy level he reports he has had some worsening fatigue over the past 6-12 months but attributes that to the hot weather. He does  have a history of hypertrophic cardiomyopathy with his last echo in 2015. His EKG now shows new anterolateral T-wave inversions. These findings could be related to hypertrophic cardiomyopathy at the apex for ischemia in the LAD territory which he previously had.  03/13/2016  [name] [name] was seen today back in follow-up. He denies any worsening shortness of breath or chest discomfort. He did undergo nuclear stress test which was negative for ischemia, and I suspect his EKG changes are due to changes in his hypertrophic cardiomyopathy. In general his exercise tolerance is decreased somewhat but fairly stable.  12/06/2016  [name] [name] returns for follow-up. He denies any new complaints since his shortness of breath or chest discomfort. He says he occasionally gets some muscle spasm in his left arm and shoulder. He denies any chest involvement. He does have history of heart hypertrophic cardiopathy and will be due for repeat echocardiogram next year. He underwent nuclear stress testing last year which was negative for ischemia. He's noted to have some PVCs on his EKG but is asymptomatic with this. Recently I received some laboratory work from his primary care provider which was drawn in April 2018. Creatinine is 1.18, liver enzymes normal, hemoglobin A1c of 7, and total cholesterol 222, triglycerides 90, HDL-C 62, and LDL-C1 42. Our staff did inquire about his dose Crestor - apparently he has not been taking his medication per his recollection for 2 months, however ultimately it seems like he has not filled the prescription since 2016. Previously he was on Crestor 20 mg.  09/03/2017  [name] [name] was seen today in follow-up.  He has a  laundry list of complaints.  He is complaining of some chest discomfort and left arm pressure, fatigue and lethargy, occasional lightheadedness and several weeks of hiccups.  He was seen in the ER several days ago for generalized weakness.  He was noted to have nausea and essentially  had no significant findings.  He was given a saline bolus and discharged.  Today blood pressure appears appropriate and heart rate is normal.  He was scheduled for a follow-up echocardiogram in May which we will likely due earlier.  In addition he notes some intermittent palpitations.  It is possible that the symptoms which are associated with fatigue could represent something like atrial fibrillation.  10/15/2017  [name] [name] returns today for follow-up of palpitations and dyspnea.  He underwent an echocardiogram which showed a hyperdynamic LV with some mild mid cavitary gradient.  A monitor showed infrequent PACs and PVCs.  He had complained to be short of breath and fatigued as well as feeling like his heart was pounding in his throat.  Subsequently lab work indicated he was significantly anemic with hemoglobin of 7.8.  Workup revealed a bleeding ulcer and this was appropriately treated.  He is now on a PPI and iron and underwent a transfusion.  His symptoms have significantly improved and he no longer has any chest discomfort, palpitations or pounding in his neck.  05/30/2018  [name] [name] is seen today for follow-up of presyncope.  He was [facility] ruled out for MI.  He was presyncopal was noted to be bradycardic.  Troponins were negative.  He was seen by Dina Rich who stopped his beta-blocker.  Heart rate has since improved.  It was as low as the 40s.  He says the symptoms are somewhat better.  He did undergo carotid Dopplers however which showed moderate bilateral carotid artery disease.  Recently had a lipid profile in March of this year which showed an elevated LDL cholesterol of 161 with a total cholesterol 232 at his PCP office.  I asked him if he had been taking his rosuvastatin 20 mg daily and he said he may have been noncompliant with the medication.  Blood pressure is elevated today not surprisingly off of his beta-blocker 159/81.  PMHx:  Past Medical History:    Diagnosis Date  . Bleeding acute gastric ulcer 08/2017  . Coronary artery disease    a. 10/2007 s/p PCI of LAD (3.5x23 Vision BMS); b. 05/2008 Cath/PCI: LM nl, LAD 74m, 80 ISR (3.5x28 Promus DES), LCX min irregs, RI nl;  c. 12/2009 MV: EF 61%, no ischemia.  . Diabetes mellitus   . Dyslipidemia   . Hypertension   . Hypertrophic cardiomyopathy (HCC)    a. 03/2014 Echo: EF 60-65%, Gr 1 DD, sev septal basal hypertrophy, no SAM but near mid-cavitary obstruction, mild MR.    Past Surgical History:  Procedure Laterality Date  . BACK SURGERY  1992  . CORONARY STENT PLACEMENT  06/03/2008   Promus stent to mid LAD; 10/23/2007 Vision stent to mid LAD  . ESOPHAGOGASTRODUODENOSCOPY N/A 09/06/2017   Procedure: ESOPHAGOGASTRODUODENOSCOPY (EGD);  Surgeon: Iva Boop, MD;  Location[facility] ENDOSCOPY;  Service: Endoscopy;  Laterality: N/A;  . FEMORAL ENDARTERECTOMY  2010   right, with bovine patch angioplasty;  post cath, r./t aneursym/claudication  . INGUINAL HERNIA REPAIR Left 08/06/2013   Procedure: HERNIA REPAIR INGUINAL ADULT;  Surgeon: [name], MD;  Location: [facility] ORS;  Service: General;  Laterality: Left;  . TRANSTHORACIC ECHOCARDIOGRAM  01/12/2010  EF>55%; mild concentric LVH; mild MR, mild TR    FAMHx:  Family History  Problem Relation Age of Onset  . Kidney disease Mother   . Heart Problems Mother   . Colon cancer Brother        >60, deceased from colon ca    SOCHx:   reports that he has never smoked. He has never used smokeless tobacco. He reports that he does not drink alcohol or use drugs.  ALLERGIES:  No Known Allergies  ROS: Pertinent items noted in HPI and remainder of comprehensive ROS otherwise negative.  HOME MEDS: Current Outpatient Medications  Medication Sig Dispense Refill  . aspirin EC 81 MG tablet Take 81 mg by mouth daily.    . ferrous sulfate 325 (65 FE) MG tablet Take 1 tablet (325 mg total) by mouth 2 (two) times daily with a meal. 60 tablet 1  .  fosinopril (MONOPRIL) 40 MG tablet TAKE ONE (1) TABLET EACH DAY 90 tablet 2  . glipiZIDE (GLUCOTROL) 10 MG tablet Take 1 tablet by mouth daily.    . metFORMIN (GLUCOPHAGE) 1000 MG tablet Take 1,000 mg by mouth 2 (two) times daily with a meal.      . Multiple Vitamins-Minerals (MULTIVITAMIN PO) Take by mouth daily.    . pantoprazole (PROTONIX) 40 MG tablet Take 1 tablet (40 mg total) by mouth daily before breakfast. 90 tablet 3  . rosuvastatin (CRESTOR) 20 MG tablet Take 1 tablet (20 mg total) by mouth daily. 30 tablet 5   No current facility-administered medications for this visit.     LABS/IMAGING: No results found for this or any previous visit (from the past 48 hour(s)). No results found.  VITALS: BP (!) 159/81   Pulse 66   Ht 5' 11.5" (1.816 m)   Wt 152 lb 9.6 oz (69.2 kg)   BMI 20.99 kg/m   EXAM: General appearance: alert and no distress Neck: no carotid bruit, no JVD and thyroid not enlarged, symmetric, no tenderness/mass/nodules Lungs: clear to auscultation bilaterally Heart: regular rate and rhythm Abdomen: soft, non-tender; bowel sounds normal; no masses,  no organomegaly Extremities: extremities normal, atraumatic, no cyanosis or edema Pulses: 2+ and symmetric Skin: Skin color, texture, turgor normal. No rashes or lesions Neurologic: Grossly normal Psych: Pleasant  EKG: Deferred  ASSESSMENT: 1. Coronary artery disease status post PCI to the mid LAD in 2009 - negative NST (2017) 2. Hypertension 3. Dyslipidemia 4. Diabetes type 2 - controlled, followed by PCP 5. SEM-asymmetric septal hypertrophy / probable hypertrophic cardiomyopathy 6. Palpitations 7. Fatigue - due to anemia from GIB 8. Bradycardia-off of beta-blocker 9. Moderate bilateral carotid artery stenosis (04/2018)  PLAN: 1.   [name] [name] had recent presyncope which may have been related to bradycardia.  His beta-blocker has been stopped.  His blood pressure is still elevated.  He will need additional  blood pressure medication and will start amlodipine 5 mg daily.  He will continue on fosinopril.  He reports variable compliance with Crestor.  His LDL is much higher than goal less than 70.  I recommend a repeat lipid profile today since he is fasting.  If he is still above goal he should work on compliance with daily Crestor usage and will repeat a lipid profile in 2 to 3 months.  At that point if he remains above goal he is a good candidate for PCSK9 inhibitor.  Follow-up with me in 3 months.  [name], MD, [facility], FACP  [place]  [facility]  Medical Director of the [facility]  [facility] Diplomate of the American Board of Clinical Lipidology Attending Cardiologist  Direct Dial: [phone]  Fax: [phone]  Website:  [email or web address] Nicely Britania Shreeve 05/30/2018, 8:45 AM

## 2018-06-03 ENCOUNTER — Other Ambulatory Visit: Payer: Self-pay | Admitting: Internal Medicine

## 2018-06-06 ENCOUNTER — Telehealth: Payer: Self-pay | Admitting: Internal Medicine

## 2018-06-06 NOTE — Telephone Encounter (Signed)
New message ° ° °Patient is returning call for lab results. °

## 2018-06-06 NOTE — Telephone Encounter (Signed)
Patient notified of lab results.   Notes recorded by Chrystie NoseHilty, Kenneth C, MD on 06/02/2018 at 8:51 AM EST LDL is up - we discussed importance of better medication compliance.  Dr. HRexene Edison

## 2018-08-25 ENCOUNTER — Other Ambulatory Visit: Payer: Self-pay | Admitting: Internal Medicine

## 2018-08-25 NOTE — Telephone Encounter (Signed)
Rx request sent to pharmacy.  

## 2018-09-12 ENCOUNTER — Ambulatory Visit: Payer: Medicare Other | Admitting: Internal Medicine

## 2018-10-29 ENCOUNTER — Telehealth: Payer: Self-pay

## 2018-10-29 NOTE — Telephone Encounter (Signed)
TELEPHONE CALL NOTE  [name] [name] has been deemed a candidate for a follow-up tele-health visit to limit community exposure during the Covid-19 pandemic. I spoke with the patient via phone to ensure availability of phone/video source, confirm preferred email & phone number, and discuss instructions and expectations.  I reminded [name] [name] to be prepared with any vital sign and/or heart rhythm information that could potentially be obtained via home monitoring, at the time of his visit. I reminded [name] [name] to expect a phone call at the time of his visit if his visit.  Did the patient verbally acknowledge consent to treatment? Yes  Parke Poissonlisha N Kyl Givler, RN 10/29/2018 3:38 PM   DOWNLOADING THE WEBEX SOFTWARE TO SMARTPHONE  - If Apple, go to [employer] Store and type in WebEx in the search bar. Download [employer] Meetings, the blue/green circle. The app is free but as with any other app downloads, their phone may require them to verify saved payment information or Apple password. The patient does NOT have to create an account.  - If Android, ask patient to go to [employer] Play Store and type in WebEx in the search bar. Download [employer] Meetings, the blue/green circle. The app is free but as with any other app downloads, their phone may require them to verify saved payment information or Android password. The patient does NOT have to create an account.   CONSENT FOR TELE-HEALTH VISIT - PLEASE REVIEW  I hereby voluntarily request, consent and authorize [facility] and its employed or contracted physicians, physician assistants, nurse practitioners or other licensed health care professionals (the Practitioner), to provide me with telemedicine health care services (the "Services") as deemed necessary by the treating Practitioner. I acknowledge and consent to receive the Services by the Practitioner via telemedicine. I understand that the telemedicine visit will involve communicating with the Practitioner  through live audiovisual communication technology and the disclosure of certain medical information by electronic transmission. I acknowledge that I have been given the opportunity to request an in-person assessment or other available alternative prior to the telemedicine visit and am voluntarily participating in the telemedicine visit.  I understand that I have the right to withhold or withdraw my consent to the use of telemedicine in the course of my care at any time, without affecting my right to future care or treatment, and that the Practitioner or I may terminate the telemedicine visit at any time. I understand that I have the right to inspect all information obtained and/or recorded in the course of the telemedicine visit and may receive copies of available information for a reasonable fee.  I understand that some of the potential risks of receiving the Services via telemedicine include:  Marland Kitchen. Delay or interruption in medical evaluation due to technological equipment failure or disruption; . Information transmitted may not be sufficient (e.g. poor resolution of images) to allow for appropriate medical decision making by the Practitioner; and/or  . In rare instances, security protocols could fail, causing a breach of personal health information.  Furthermore, I acknowledge that it is my responsibility to provide information about my medical history, conditions and care that is complete and accurate to the best of my ability. I acknowledge that Practitioner's advice, recommendations, and/or decision may be based on factors not within their control, such as incomplete or inaccurate data provided by me or distortions of diagnostic images or specimens that may result from electronic transmissions. I understand that the practice of medicine is not an exact science and that  Practitioner makes no warranties or guarantees regarding treatment outcomes. I acknowledge that I will receive a copy of this consent  concurrently upon execution via email to the email address I last provided but may also request a printed copy by calling the office of [facility].    I understand that my insurance will be billed for this visit.   I have read or had this consent read to me. . I understand the contents of this consent, which adequately explains the benefits and risks of the Services being provided via telemedicine.  . I have been provided ample opportunity to ask questions regarding this consent and the Services and have had my questions answered to my satisfaction. . I give my informed consent for the services to be provided through the use of telemedicine in my medical care  By participating in this telemedicine visit I agree to the above.

## 2018-11-03 ENCOUNTER — Telehealth (INDEPENDENT_AMBULATORY_CARE_PROVIDER_SITE_OTHER): Payer: Medicare Other | Admitting: Internal Medicine

## 2018-11-03 ENCOUNTER — Encounter: Payer: Self-pay | Admitting: Internal Medicine

## 2018-11-03 VITALS — BP 142/80 | HR 64 | Temp 97.1°F | Ht 71.5 in | Wt 145.0 lb

## 2018-11-03 DIAGNOSIS — I10 Essential (primary) hypertension: Secondary | ICD-10-CM

## 2018-11-03 DIAGNOSIS — I422 Other hypertrophic cardiomyopathy: Secondary | ICD-10-CM

## 2018-11-03 DIAGNOSIS — E782 Mixed hyperlipidemia: Secondary | ICD-10-CM

## 2018-11-03 DIAGNOSIS — Z7189 Other specified counseling: Secondary | ICD-10-CM

## 2018-11-03 DIAGNOSIS — I2510 Atherosclerotic heart disease of native coronary artery without angina pectoris: Secondary | ICD-10-CM

## 2018-11-03 DIAGNOSIS — E785 Hyperlipidemia, unspecified: Secondary | ICD-10-CM

## 2018-11-03 DIAGNOSIS — E1165 Type 2 diabetes mellitus with hyperglycemia: Secondary | ICD-10-CM

## 2018-11-03 NOTE — Patient Instructions (Signed)
Medication Instructions:  Your physician recommends that you continue on your current medications as directed. Please refer to the Current Medication list given to you today.  If you need a refill on your cardiac medications before your next appointment, please call your pharmacy.   Follow-Up: At [facility], you and your health needs are our priority.  As part of our continuing mission to provide you with exceptional heart care, we have created designated Provider Care Teams.  These Care Teams include your primary Cardiologist (physician) and Advanced Practice Providers (APPs -  Physician Assistants and Nurse Practitioners) who all work together to provide you with the care you need, when you need it. You will need a follow up appointment in 6 months.  Please call our office 2 months in advance to schedule this appointment.  You may see Kenneth C Hilty, MD or one of the following Advanced Practice Providers on your designated Care Team: Hao Meng, PA-C . Angela Duke, PA-C  Any Other Special Instructions Will Be Listed Below (If Applicable). None   

## 2018-11-03 NOTE — Progress Notes (Signed)
Virtual Visit via Video Note   This visit type was conducted due to national recommendations for restrictions regarding the COVID-19 Pandemic (e.g. social distancing) in an effort to limit this patient's exposure and mitigate transmission in our community.  Due to his co-morbid illnesses, this patient is at least at moderate risk for complications without adequate follow up.  This format is felt to be most appropriate for this patient at this time.  All issues noted in this document were discussed and addressed.  A limited physical exam was performed with this format.  Please refer to the patient's chart for his consent to telehealth for [facility].   Evaluation Performed:  Video visit for follow-up  Date:  11/03/2018   ID:  [name] [name], DOB 09-06-1945, MRN [id number]  Patient Location:  [address] [place] [address]  Provider location:   [address], [address] [place], [place] [address]  PCP:  Jerlyn Ly, MD  Cardiologist:  [name], MD Electrophysiologist:  None   Chief Complaint:  No complaints  History of Present Illness:    [name] [name] is a 73 y.o. male who presents via audio/video conferencing for a telehealth visit today.  This is a pleasant 73 year old gentleman I followed for a long time with history of coronary artery disease and PCI to the mid LAD in 2009.  His last stress test was in 2017 and was negative for ischemia.  He is also had hypertension and had bradycardia on a beta-blocker.  They were discontinued and recently I added amlodipine.  He is tolerating this well and blood pressures generally into the 130-140 systolic range.  He probably has some degree of asymmetric septal hypertrophy without significant outflow tract obstruction.  He also has uncontrolled diabetes with hemoglobin A1c of 11.9 in March 2020.  Based on this he was started on Glyxambi.  He is working to try to cut sugars out of his diet and has lost 5 pounds since his last visit  in November.  He denies any palpitations.  A lipid profile in March 2020 showed improvement in his cholesterol with a decrease in total cholesterol from 232-1 65.  Triglycerides were 102, HDL 59 and LDL of 86.  This is very reasonable control given the fact that his fasting blood sugar was 305.  The patient does not have symptoms concerning for COVID-19 infection (fever, chills, cough, or new SHORTNESS OF BREATH).    Prior CV studies:   The following studies were reviewed today:  Lab work  PMHx:  Past Medical History:  Diagnosis Date   Bleeding acute gastric ulcer 08/2017   Coronary artery disease    a. 10/2007 s/p PCI of LAD (3.5x23 Vision BMS); b. 05/2008 Cath/PCI: LM nl, LAD 54m, 80 ISR (3.5x28 Promus DES), LCX min irregs, RI nl;  c. 12/2009 MV: EF 61%, no ischemia.   Diabetes mellitus    Dyslipidemia    Hypertension    Hypertrophic cardiomyopathy (HCC)    a. 03/2014 Echo: EF 60-65%, Gr 1 DD, sev septal basal hypertrophy, no SAM but near mid-cavitary obstruction, mild MR.    Past Surgical History:  Procedure Laterality Date   BACK SURGERY  1992   CORONARY STENT PLACEMENT  06/03/2008   Promus stent to mid LAD; 10/23/2007 Vision stent to mid LAD   ESOPHAGOGASTRODUODENOSCOPY N/A 09/06/2017   Procedure: ESOPHAGOGASTRODUODENOSCOPY (EGD);  Surgeon: Iva Boop, MD;  Location: [facility];  Service: Endoscopy;  Laterality: N/A;   FEMORAL ENDARTERECTOMY  2010   right, with bovine patch angioplasty;  post cath, r./t aneursym/claudication   INGUINAL HERNIA REPAIR Left 08/06/2013   Procedure: HERNIA REPAIR INGUINAL ADULT;  Surgeon: [name], MD;  Location: [facility];  Service: General;  Laterality: Left;   TRANSTHORACIC ECHOCARDIOGRAM  01/12/2010   EF>55%; mild concentric LVH; mild MR, mild TR    FAMHx:  Family History  Problem Relation Age of Onset   Kidney disease Mother    Heart Problems Mother    Colon cancer Brother        >60, deceased from colon ca     SOCHx:   reports that he has never smoked. He has never used smokeless tobacco. He reports that he does not drink alcohol or use drugs.  ALLERGIES:  No Known Allergies  MEDS:  Current Meds  Medication Sig   amLODipine (NORVASC) 5 MG tablet Take 1 tablet (5 mg total) by mouth daily.   aspirin EC 81 MG tablet Take 81 mg by mouth daily.   Empagliflozin-linaGLIPtin (GLYXAMBI) 10-5 MG TABS Take 1 tablet by mouth daily.   ferrous sulfate 325 (65 FE) MG tablet Take 1 tablet (325 mg total) by mouth 2 (two) times daily with a meal.   fosinopril (MONOPRIL) 40 MG tablet TAKE ONE (1) TABLET EACH DAY   glipiZIDE (GLUCOTROL) 10 MG tablet Take 1 tablet by mouth daily.   metFORMIN (GLUCOPHAGE) 1000 MG tablet Take 1,000 mg by mouth 2 (two) times daily with a meal.     Multiple Vitamins-Minerals (MULTIVITAMIN PO) Take by mouth daily.   pantoprazole (PROTONIX) 40 MG tablet Take 1 tablet (40 mg total) by mouth daily before breakfast.   rosuvastatin (CRESTOR) 10 MG tablet Take 10 mg by mouth daily.   [DISCONTINUED] rosuvastatin (CRESTOR) 20 MG tablet Take 1 tablet (20 mg total) by mouth daily.     ROS: Pertinent items noted in HPI and remainder of comprehensive ROS otherwise negative.  Labs/Other Tests and Data Reviewed:    Recent Labs: 05/21/2018: ALT 16; TSH 1.758 05/22/2018: BUN 12; Creatinine, Ser 0.82; Hemoglobin 11.4; Platelets 193; Potassium 3.8; Sodium 140   Recent Lipid Panel Lab Results  Component Value Date/Time   CHOL 193 05/30/2018 09:17 AM   TRIG 45 05/30/2018 09:17 AM   HDL 59 05/30/2018 09:17 AM   CHOLHDL 3.3 05/30/2018 09:17 AM   CHOLHDL 2.5 Ratio 03/30/2009 03:50 AM   LDLCALC 125 (H) 05/30/2018 09:17 AM    Wt Readings from Last 3 Encounters:  11/03/18 145 lb (65.8 kg)  05/30/18 152 lb 9.6 oz (69.2 kg)  05/22/18 150 lb 12.7 oz (68.4 kg)     Exam:    Vital Signs:  BP (!) 142/80    Pulse 64    Temp (!) 97.1 F (36.2 C)    Ht 5' 11.5" (1.816 m)    Wt 145  lb (65.8 kg)    BMI 19.94 kg/m    General appearance: alert, no distress and thin Lungs: no audible wheezing or respiratory difficult Extremities: extremities normal, atraumatic, no cyanosis or edema Skin: Skin color, texture, turgor normal. No rashes or lesions Neurologic: Mental status: Alert, oriented, thought content appropriate Psych: Pleasant  ASSESSMENT & PLAN:    1. Coronary artery disease status post PCI to the mid LAD in 2009 - negative NST (2017) 2. Hypertension 3. Dyslipidemia 4. Diabetes type 2 - uncontrolled, A1c 11.9 (just started on Glyxambi) 5. SEM-asymmetric septal hypertrophy / probable hypertrophic cardiomyopathy 6. Palpitations 7. Fatigue - due to  anemia from GIB 8. Bradycardia-off of beta-blocker 9. Moderate bilateral carotid artery stenosis (04/2018)  [name] is doing well and is asymptomatic.  He denies any chest pain or worsening shortness of breath.  I reviewed lab work from an office visit in March 2020 with his PCP.  His lipid profile is significantly improved although LDLs not quite at goal less than 70.  He is only on 10 mg of rosuvastatin daily and was previously supposed to be on 20 mg however he may have been intolerant to the higher dose.  He has not had any worsening fatigue or bradycardia.  He is off of his beta-blocker.  His blood pressure is better controlled now on a calcium channel blocker.  Unfortunately his diabetes is poorly controlled.  His hemoglobin A1c in March was 11.9.  He was started on Glyxambi in addition to metformin.  Hopefully this will better improve his numbers.  We talked about reducing sugars and carbohydrates in his diet.  He has lost weight recently although the foods he is eating may be in higher glycemic content.  Overall it seems to be doing well.  No changes were made to his medicines today.  Plan follow-up with me in 6 months or sooner as necessary.  COVID-19 Education: The signs and symptoms of COVID-19 were discussed with  the patient and how to seek care for testing (follow up with PCP or arrange E-visit).  The importance of social distancing was discussed today.  Patient Risk:   After full review of this patients clinical status, I feel that they are at least moderate risk at this time.  Time:   Today, I have spent 25 minutes with the patient with telehealth technology discussing coronary symptoms, blood pressure, labwork.     Medication Adjustments/Labs and Tests Ordered: Current medicines are reviewed at length with the patient today.  Concerns regarding medicines are outlined above.   Tests Ordered: No orders of the defined types were placed in this encounter.   Medication Changes: No orders of the defined types were placed in this encounter.   Disposition:  in 6 month(s)  Chrystie NoseKenneth C. Tiberius Loftus, MD, [name]oscopyFACC, FACP  [place]   [facility]  Medical Director of the Advanced Lipid Disorders &  Cardiovascular Risk Reduction Clinic Diplomate of the American Board of Clinical Lipidology Attending Cardiologist  Direct Dial: [phone]   Fax: [phone]  Website:  [email or web address]  Chrystie NoseKenneth C Tuana Hoheisel, MD  11/03/2018 10:16 AM

## 2018-11-06 ENCOUNTER — Ambulatory Visit: Payer: Medicare Other | Admitting: Internal Medicine

## 2018-11-10 ENCOUNTER — Other Ambulatory Visit: Payer: Self-pay | Admitting: Internal Medicine

## 2018-11-10 NOTE — Telephone Encounter (Signed)
Rosuvastatin 20 mg refilled  

## 2018-11-25 ENCOUNTER — Other Ambulatory Visit: Payer: Self-pay | Admitting: Internal Medicine

## 2019-02-21 ENCOUNTER — Other Ambulatory Visit: Payer: Self-pay | Admitting: Internal Medicine

## 2019-03-18 ENCOUNTER — Telehealth: Payer: Self-pay | Admitting: Internal Medicine

## 2019-03-18 NOTE — Telephone Encounter (Signed)
Spoke with pt who report per last conversation with Dr. Debara Pickett, MD was concerned pt had lost a lot of weight and requesting pt track weight. Pt calling to report on 7/13 he was 141 and today 127. Pt state he hasn't had a big appetite and doesn't eat as much as he use to. Pt also report he is very active but noticed he doesn't have as much energy.  Pt also wanted to make MD aware that his daughter test positve for COVID on 9/4 and he's been quarantine since. He report he feels fine and doesn't have any symptoms.

## 2019-03-18 NOTE — Telephone Encounter (Signed)
Pt updated and voiced understanding. He report he will contact pcp today.

## 2019-03-18 NOTE — Telephone Encounter (Signed)
Sorry to hear about his daughter - would advise he see his PCP about weight loss- this is significant and concerning.  Dr. Lemmie Evens

## 2019-03-18 NOTE — Telephone Encounter (Signed)
° ° °  Patient calling to report weight, stating Dr Debara Pickett asked him to monitor weight  Patient states on July 13 he was 141 pounds, today he is 127 pounds.[name] [name]

## 2019-05-06 ENCOUNTER — Other Ambulatory Visit: Payer: Self-pay | Admitting: Internal Medicine

## 2019-05-06 ENCOUNTER — Telehealth: Payer: Self-pay | Admitting: Internal Medicine

## 2019-05-06 NOTE — Telephone Encounter (Signed)
LM for patient that 05/12/19 appointment is virtual appt only. Asked that he call back to confirm this appt type is OK or r/s for first available in-offie early-mid Nov (OK to use DOD, 24/48 hour slot per Eliezer Lofts if needed)  Appointment type changed

## 2019-05-12 ENCOUNTER — Encounter: Payer: Self-pay | Admitting: Internal Medicine

## 2019-05-12 ENCOUNTER — Telehealth (INDEPENDENT_AMBULATORY_CARE_PROVIDER_SITE_OTHER): Payer: Medicare Other | Admitting: Internal Medicine

## 2019-05-12 VITALS — BP 156/73 | HR 68 | Wt 135.0 lb

## 2019-05-12 DIAGNOSIS — N401 Enlarged prostate with lower urinary tract symptoms: Secondary | ICD-10-CM | POA: Diagnosis not present

## 2019-05-12 DIAGNOSIS — N138 Other obstructive and reflux uropathy: Secondary | ICD-10-CM

## 2019-05-12 DIAGNOSIS — I422 Other hypertrophic cardiomyopathy: Secondary | ICD-10-CM

## 2019-05-12 DIAGNOSIS — I10 Essential (primary) hypertension: Secondary | ICD-10-CM | POA: Diagnosis not present

## 2019-05-12 DIAGNOSIS — R55 Syncope and collapse: Secondary | ICD-10-CM

## 2019-05-12 DIAGNOSIS — I2510 Atherosclerotic heart disease of native coronary artery without angina pectoris: Secondary | ICD-10-CM

## 2019-05-12 NOTE — Patient Instructions (Signed)
Medication Instructions:  Your physician recommends that you continue on your current medications as directed. Please refer to the Current Medication list given to you today.  *If you need a refill on your cardiac medications before your next appointment, please call your pharmacy*  Lab Work: Your physician recommends that you continue on your current medications as directed. Please refer to the Current Medication list given to you today.  If you have labs (blood work) drawn today and your tests are completely normal, you will receive your results only by: Marland Kitchen MyChart Message (if you have MyChart) OR . A paper copy in the mail If you have any lab test that is abnormal or we need to change your treatment, we will call you to review the results.  Testing/Procedures: Echo @ [place]: At [facility], you and your health needs are our priority.  As part of our continuing mission to provide you with exceptional heart care, we have created designated Provider Care Teams.  These Care Teams include your primary Cardiologist (physician) and Advanced Practice Providers (APPs -  Physician Assistants and Nurse Practitioners) who all work together to provide you with the care you need, when you need it.  Your next appointment:   6 months  The format for your next appointment:   In Person  Provider:   You may see Pixie Casino, MD or one of the following Advanced Practice Providers on your designated Care Team:    Almyra Deforest, PA-C  Fabian Sharp, PA-C or   Roby Lofts, [place]   Other Instructions

## 2019-05-12 NOTE — Progress Notes (Signed)
Virtual Visit via Telephone Note   This visit type was conducted due to national recommendations for restrictions regarding the COVID-19 Pandemic (e.g. social distancing) in an effort to limit this patient's exposure and mitigate transmission in our community.  Due to his co-morbid illnesses, this patient is at least at moderate risk for complications without adequate follow up.  This format is felt to be most appropriate for this patient at this time.  The patient did not have access to video technology/had technical difficulties with video requiring transitioning to audio format only (telephone).  All issues noted in this document were discussed and addressed.  No physical exam could be performed with this format.  Please refer to the patient's chart for his  consent to telehealth for [facility].   Evaluation Performed:  Telephone follow-up  Date:  05/12/2019   ID:  Camran Keady, DOB 18-Aug-1945, MRN [id number]  Patient Location:  [place] [address]  Provider location:   [address], [place], [place] [address]  PCP:  Ann Held, MD  Cardiologist:  Pixie Casino, MD Electrophysiologist:  None   Chief Complaint:  Syncope, recent COVID  History of Present Illness:    Oaklyn Lesniak is a 73 y.o. male who presents via audio/video conferencing for a telehealth visit today.  Mr. Schara is seen today in follow-up.  He has a history of coronary disease with prior PCI to the LAD in 2009, hypertrophic cardiomyopathy without obstruction, type 2 diabetes, dyslipidemia, hypertension, BPH and history of near syncopal or syncopal episodes.  Most recently he and his family unfortunately contracted Covid in August.  They were quarantined for a few weeks.  Apparently his grandchildren had worse symptoms than he and his wife did.  He did recover and then afterwards about a week ago he had an episode of syncope.  This may have been micturition syncope, as it occurred  when he got up in the middle of the night to go to the bathroom.  He said he was found down by his wife after she heard a thud and he was noted to be incontinent.  Recently he has been struggling with some increased urinary retention and his PCP had increased his tamsulosin to 0.8 mg.  This might of played a role in this.  He also has a hypertrophic cardiomyopathy which was last assessed by echo about a year and a half ago.  Is possible this could also have progressed.  Blood pressure is noted to be elevated somewhat today.  He denied any palpitations, tachycardia, chest pain or other associated symptoms preceding the syncopal event.  The patient does not have symptoms concerning for COVID-19 infection (fever, chills, cough, or new SHORTNESS OF BREATH).    Prior CV studies:   The following studies were reviewed today:  Chart reviewed  PMHx:  Past Medical History:  Diagnosis Date   Bleeding acute gastric ulcer 08/2017   Coronary artery disease    a. 10/2007 s/p PCI of LAD (3.5x23 Vision BMS); b. 05/2008 Cath/PCI: LM nl, LAD 27m, 80 ISR (3.5x28 Promus DES), LCX min irregs, RI nl;  c. 12/2009 MV: EF 61%, no ischemia.   Diabetes mellitus    Dyslipidemia    Hypertension    Hypertrophic cardiomyopathy ([place])    a. 03/2014 Echo: EF 60-65%, Gr 1 DD, sev septal basal hypertrophy, no SAM but near mid-cavitary obstruction, mild MR.    Past Surgical History:  Procedure Laterality Date   BACK  SURGERY  1992   CORONARY STENT PLACEMENT  06/03/2008   Promus stent to mid LAD; 10/23/2007 Vision stent to mid LAD   ESOPHAGOGASTRODUODENOSCOPY N/A 09/06/2017   Procedure: ESOPHAGOGASTRODUODENOSCOPY (EGD);  Surgeon: Iva BoopGessner, Carl E, MD;  [facility];  Service: Endoscopy;  Laterality: N/A;   FEMORAL ENDARTERECTOMY  2010   right, with bovine patch angioplasty;  post cath, r./t aneursym/claudication   INGUINAL HERNIA REPAIR Left 08/06/2013   Procedure: HERNIA REPAIR INGUINAL ADULT;  Surgeon:  Marlane HatcherWilliam S Bradford, MD;  Location: [facility];  Service: General;  Laterality: Left;   TRANSTHORACIC ECHOCARDIOGRAM  01/12/2010   EF>55%; mild concentric LVH; mild MR, mild TR    FAMHx:  Family History  Problem Relation Age of Onset   Kidney disease Mother    Heart Problems Mother    Colon cancer Brother        >60, deceased from colon ca    SOCHx:   reports that he has never smoked. He has never used smokeless tobacco. He reports that he does not drink alcohol or use drugs.  ALLERGIES:  No Known Allergies  MEDS:  Current Meds  Medication Sig   amLODipine (NORVASC) 5 MG tablet TAKE ONE (1) TABLET EACH DAY   aspirin EC 81 MG tablet Take 81 mg by mouth daily.   Empagliflozin-linaGLIPtin (GLYXAMBI) 10-5 MG TABS Take 1 tablet by mouth daily.   ferrous sulfate 325 (65 FE) MG tablet Take 1 tablet (325 mg total) by mouth 2 (two) times daily with a meal.   fosinopril (MONOPRIL) 40 MG tablet TAKE ONE (1) TABLET EACH DAY   glipiZIDE (GLUCOTROL) 10 MG tablet Take 1 tablet by mouth daily.   metFORMIN (GLUCOPHAGE) 1000 MG tablet TAKE ONE TABLET BY MOUTH TWICE DAILY   Multiple Vitamins-Minerals (MULTIVITAMIN PO) Take by mouth daily.   pantoprazole (PROTONIX) 40 MG tablet Take 1 tablet (40 mg total) by mouth daily before breakfast.   rosuvastatin (CRESTOR) 10 MG tablet Take 10 mg by mouth daily.     ROS: Pertinent items noted in HPI and remainder of comprehensive ROS otherwise negative.  Labs/Other Tests and Data Reviewed:    Recent Labs: 05/21/2018: ALT 16; TSH 1.758 05/22/2018: BUN 12; Creatinine, Ser 0.82; Hemoglobin 11.4; Platelets 193; Potassium 3.8; Sodium 140   Recent Lipid Panel Lab Results  Component Value Date/Time   CHOL 193 05/30/2018 09:17 AM   TRIG 45 05/30/2018 09:17 AM   HDL 59 05/30/2018 09:17 AM   CHOLHDL 3.3 05/30/2018 09:17 AM   CHOLHDL 2.5 Ratio 03/30/2009 03:50 AM   LDLCALC 125 (H) 05/30/2018 09:17 AM    Wt Readings from Last 3 Encounters:    05/12/19 135 lb (61.2 kg)  11/03/18 145 lb (65.8 kg)  05/30/18 152 lb 9.6 oz (69.2 kg)     Exam:    Vital Signs:  BP (!) 156/73    Pulse 68    Wt 135 lb (61.2 kg)    BMI 18.57 kg/m    Exam not performed due to telephone visit  ASSESSMENT & PLAN:    1. Syncope-possible micturition 2. BPH 3. Hypertrophic cardiomyopathy 4. Coronary artery disease with prior PCI to the LAD (2009) 5. Hypertension 6. Dyslipidemia 7. Recent COVID-19 infection  [name] is recovering from recent COVID-19 infection.  He said he had mild symptoms which is good, although long-term cardiac involvement is possible.  He does have a history of hypertrophic cardiomyopathy and subsequently had a syncopal episode.  It sounds like it may be  micturition syncope however he did have incontinence and has been having more difficulty urinating requiring an increase in his tamsulosin.  Possibly increase in medicine could have contributed to him passing out if for example he was somewhat volume depleted and blood pressure was low in the setting of hypertrophic cardiomyopathy.  It is also possible that there may be some new LV dysfunction or worsening hypertrophic cardiomyopathy with a degree of obstruction.  I cannot auscultate him via virtual visit therefore we will go ahead and repeat an echocardiogram since it was last done about a year and a half ago.  He may need to discuss the dosing of his tamsulosin with his primary care provider which I will include in this note and possibly consider a referral to urology.  COVID-19 Education: The signs and symptoms of COVID-19 were discussed with the patient and how to seek care for testing (follow up with PCP or arrange E-visit).  The importance of social distancing was discussed today.  Patient Risk:   After full review of this patients clinical status, I feel that they are at least moderate risk at this time.  Time:   Today, I have spent 25 minutes with the patient with  telehealth technology discussing COVID, syncope, BPH, hypertension.     Medication Adjustments/Labs and Tests Ordered: Current medicines are reviewed at length with the patient today.  Concerns regarding medicines are outlined above.   Tests Ordered: Orders Placed This Encounter  Procedures   ECHOCARDIOGRAM COMPLETE    Medication Changes: No orders of the defined types were placed in this encounter.   Disposition:  in 6 month(s)  [name], MD, [facility], FACP  [place]   [facility]  Medical Director of the [facility] Lipid [facility]  Cardiovascular Risk Reduction Clinic Diplomate of the American Board of Clinical Lipidology Attending Cardiologist  Direct Dial: [phone]   Fax: [phone]  Website:  [email or web address]  [name], MD  05/12/2019 9:40 AM

## 2019-05-22 ENCOUNTER — Other Ambulatory Visit: Payer: Self-pay | Admitting: Internal Medicine

## 2019-05-25 ENCOUNTER — Ambulatory Visit (HOSPITAL_COMMUNITY)
Admission: RE | Admit: 2019-05-25 | Discharge: 2019-05-25 | Disposition: A | Discharge status: Home / Self Care | Payer: Medicare Other | Source: Ambulatory Visit | Attending: Internal Medicine | Admitting: Internal Medicine

## 2019-05-25 ENCOUNTER — Other Ambulatory Visit: Payer: Self-pay

## 2019-05-25 DIAGNOSIS — R55 Syncope and collapse: Secondary | ICD-10-CM | POA: Diagnosis not present

## 2019-05-25 NOTE — Progress Notes (Signed)
Echocardiogram 2D Echocardiogram has been performed.  [name] [name] 05/25/2019, 2:29 PM

## 2019-06-09 ENCOUNTER — Other Ambulatory Visit: Payer: Self-pay

## 2019-06-09 ENCOUNTER — Observation Stay (HOSPITAL_COMMUNITY): Payer: Medicare Other

## 2019-06-09 ENCOUNTER — Inpatient Hospital Stay (HOSPITAL_COMMUNITY)
Admission: EM | Admit: 2019-06-09 | Discharge: 2019-06-23 | DRG: 871 | Disposition: E | Payer: Medicare Other | Attending: Internal Medicine | Admitting: Internal Medicine

## 2019-06-09 ENCOUNTER — Encounter (HOSPITAL_COMMUNITY): Payer: Self-pay | Admitting: Emergency Medicine

## 2019-06-09 ENCOUNTER — Emergency Department (HOSPITAL_COMMUNITY): Payer: Medicare Other

## 2019-06-09 DIAGNOSIS — R0682 Tachypnea, not elsewhere classified: Secondary | ICD-10-CM

## 2019-06-09 DIAGNOSIS — Z7984 Long term (current) use of oral hypoglycemic drugs: Secondary | ICD-10-CM

## 2019-06-09 DIAGNOSIS — E860 Dehydration: Secondary | ICD-10-CM | POA: Diagnosis present

## 2019-06-09 DIAGNOSIS — E119 Type 2 diabetes mellitus without complications: Secondary | ICD-10-CM

## 2019-06-09 DIAGNOSIS — R0789 Other chest pain: Secondary | ICD-10-CM | POA: Diagnosis not present

## 2019-06-09 DIAGNOSIS — A419 Sepsis, unspecified organism: Principal | ICD-10-CM | POA: Diagnosis present

## 2019-06-09 DIAGNOSIS — E1110 Type 2 diabetes mellitus with ketoacidosis without coma: Secondary | ICD-10-CM | POA: Diagnosis not present

## 2019-06-09 DIAGNOSIS — Y95 Nosocomial condition: Secondary | ICD-10-CM | POA: Diagnosis present

## 2019-06-09 DIAGNOSIS — J939 Pneumothorax, unspecified: Secondary | ICD-10-CM

## 2019-06-09 DIAGNOSIS — I10 Essential (primary) hypertension: Secondary | ICD-10-CM | POA: Diagnosis present

## 2019-06-09 DIAGNOSIS — J9690 Respiratory failure, unspecified, unspecified whether with hypoxia or hypercapnia: Secondary | ICD-10-CM

## 2019-06-09 DIAGNOSIS — M791 Myalgia, unspecified site: Secondary | ICD-10-CM

## 2019-06-09 DIAGNOSIS — N179 Acute kidney failure, unspecified: Secondary | ICD-10-CM | POA: Diagnosis not present

## 2019-06-09 DIAGNOSIS — Z79899 Other long term (current) drug therapy: Secondary | ICD-10-CM

## 2019-06-09 DIAGNOSIS — I2510 Atherosclerotic heart disease of native coronary artery without angina pectoris: Secondary | ICD-10-CM | POA: Diagnosis present

## 2019-06-09 DIAGNOSIS — K7200 Acute and subacute hepatic failure without coma: Secondary | ICD-10-CM | POA: Diagnosis present

## 2019-06-09 DIAGNOSIS — Z515 Encounter for palliative care: Secondary | ICD-10-CM | POA: Diagnosis not present

## 2019-06-09 DIAGNOSIS — E43 Unspecified severe protein-calorie malnutrition: Secondary | ICD-10-CM | POA: Diagnosis present

## 2019-06-09 DIAGNOSIS — Z955 Presence of coronary angioplasty implant and graft: Secondary | ICD-10-CM

## 2019-06-09 DIAGNOSIS — E785 Hyperlipidemia, unspecified: Secondary | ICD-10-CM | POA: Diagnosis present

## 2019-06-09 DIAGNOSIS — R0600 Dyspnea, unspecified: Secondary | ICD-10-CM

## 2019-06-09 DIAGNOSIS — Z66 Do not resuscitate: Secondary | ICD-10-CM | POA: Diagnosis not present

## 2019-06-09 DIAGNOSIS — I422 Other hypertrophic cardiomyopathy: Secondary | ICD-10-CM | POA: Diagnosis present

## 2019-06-09 DIAGNOSIS — J90 Pleural effusion, not elsewhere classified: Secondary | ICD-10-CM | POA: Diagnosis present

## 2019-06-09 DIAGNOSIS — R140 Abdominal distension (gaseous): Secondary | ICD-10-CM

## 2019-06-09 DIAGNOSIS — K56609 Unspecified intestinal obstruction, unspecified as to partial versus complete obstruction: Secondary | ICD-10-CM

## 2019-06-09 DIAGNOSIS — K56600 Partial intestinal obstruction, unspecified as to cause: Secondary | ICD-10-CM | POA: Diagnosis present

## 2019-06-09 DIAGNOSIS — J189 Pneumonia, unspecified organism: Secondary | ICD-10-CM | POA: Diagnosis present

## 2019-06-09 DIAGNOSIS — R531 Weakness: Secondary | ICD-10-CM | POA: Diagnosis not present

## 2019-06-09 DIAGNOSIS — E1151 Type 2 diabetes mellitus with diabetic peripheral angiopathy without gangrene: Secondary | ICD-10-CM | POA: Diagnosis present

## 2019-06-09 DIAGNOSIS — Z789 Other specified health status: Secondary | ICD-10-CM

## 2019-06-09 DIAGNOSIS — Z7982 Long term (current) use of aspirin: Secondary | ICD-10-CM

## 2019-06-09 DIAGNOSIS — E872 Acidosis, unspecified: Secondary | ICD-10-CM | POA: Diagnosis present

## 2019-06-09 DIAGNOSIS — R6521 Severe sepsis with septic shock: Secondary | ICD-10-CM | POA: Diagnosis present

## 2019-06-09 DIAGNOSIS — Z681 Body mass index (BMI) 19 or less, adult: Secondary | ICD-10-CM

## 2019-06-09 DIAGNOSIS — Z452 Encounter for adjustment and management of vascular access device: Secondary | ICD-10-CM

## 2019-06-09 DIAGNOSIS — E873 Alkalosis: Secondary | ICD-10-CM | POA: Diagnosis present

## 2019-06-09 DIAGNOSIS — J9601 Acute respiratory failure with hypoxia: Secondary | ICD-10-CM | POA: Diagnosis present

## 2019-06-09 DIAGNOSIS — Z20828 Contact with and (suspected) exposure to other viral communicable diseases: Secondary | ICD-10-CM | POA: Diagnosis present

## 2019-06-09 DIAGNOSIS — R000 Tachycardia, unspecified: Secondary | ICD-10-CM

## 2019-06-09 LAB — BASIC METABOLIC PANEL
Anion gap: 15 (ref 5–15)
BUN: 25 mg/dL — ABNORMAL HIGH (ref 8–23)
CO2: 15 mmol/L — ABNORMAL LOW (ref 22–32)
Calcium: 8.3 mg/dL — ABNORMAL LOW (ref 8.9–10.3)
Chloride: 110 mmol/L (ref 98–111)
Creatinine, Ser: 1.74 mg/dL — ABNORMAL HIGH (ref 0.61–1.24)
GFR calc Af Amer: 44 mL/min — ABNORMAL LOW (ref >60)
GFR calc non Af Amer: 38 mL/min — ABNORMAL LOW (ref >60)
Glucose, Bld: 197 mg/dL — ABNORMAL HIGH (ref 70–99)
Potassium: 4.7 mmol/L (ref 3.5–5.1)
Sodium: 140 mmol/L (ref 135–145)

## 2019-06-09 LAB — BLOOD GAS, ARTERIAL
Acid-base deficit: 12.1 mmol/L — ABNORMAL HIGH (ref 0.0–2.0)
Bicarbonate: 15.6 mmol/L — ABNORMAL LOW (ref 20.0–28.0)
FIO2: 21
O2 Saturation: 94.6 %
Patient temperature: 37.7
pCO2 arterial: 22.1 mmHg — ABNORMAL LOW (ref 32.0–48.0)
pH, Arterial: 7.364 (ref 7.350–7.450)
pO2, Arterial: 78.2 mmHg — ABNORMAL LOW (ref 83.0–108.0)

## 2019-06-09 LAB — BETA-HYDROXYBUTYRIC ACID: Beta-Hydroxybutyric Acid: 3.04 mmol/L — ABNORMAL HIGH (ref 0.05–0.27)

## 2019-06-09 LAB — URINALYSIS, ROUTINE W REFLEX MICROSCOPIC
Bacteria, UA: NONE SEEN
Bilirubin Urine: NEGATIVE
Glucose, UA: >=500 mg/dL — AB
Ketones, ur: 20 mg/dL — AB
Leukocytes,Ua: NEGATIVE
Nitrite: NEGATIVE
Protein, ur: 30 mg/dL — AB
Specific Gravity, Urine: 1.02 (ref 1.005–1.030)
pH: 5 (ref 5.0–8.0)

## 2019-06-09 LAB — CBC WITH DIFFERENTIAL/PLATELET
Abs Immature Granulocytes: 0 10*3/uL (ref 0.00–0.07)
Basophils Absolute: 0 10*3/uL (ref 0.0–0.1)
Basophils Relative: 1 %
Eosinophils Absolute: 0 10*3/uL (ref 0.0–0.5)
Eosinophils Relative: 0 %
HCT: 35.9 % — ABNORMAL LOW (ref 39.0–52.0)
Hemoglobin: 11.7 g/dL — ABNORMAL LOW (ref 13.0–17.0)
Immature Granulocytes: 0 %
Lymphocytes Relative: 9 %
Lymphs Abs: 0.4 10*3/uL — ABNORMAL LOW (ref 0.7–4.0)
MCH: 29 pg (ref 26.0–34.0)
MCHC: 32.6 g/dL (ref 30.0–36.0)
MCV: 88.9 fL (ref 80.0–100.0)
Monocytes Absolute: 0.3 10*3/uL (ref 0.1–1.0)
Monocytes Relative: 6 %
Neutro Abs: 3.5 10*3/uL (ref 1.7–7.7)
Neutrophils Relative %: 84 %
Platelets: 211 10*3/uL (ref 150–400)
RBC: 4.04 MIL/uL — ABNORMAL LOW (ref 4.22–5.81)
RDW: 12.6 % (ref 11.5–15.5)
WBC: 4.1 10*3/uL (ref 4.0–10.5)
nRBC: 0 % (ref 0.0–0.2)

## 2019-06-09 LAB — GLUCOSE, CAPILLARY
Glucose-Capillary: 297 mg/dL — ABNORMAL HIGH (ref 70–99)
Glucose-Capillary: 205 mg/dL — ABNORMAL HIGH (ref 70–99)
Glucose-Capillary: 267 mg/dL — ABNORMAL HIGH (ref 70–99)

## 2019-06-09 LAB — COMPREHENSIVE METABOLIC PANEL
ALT: 30 U/L (ref 0–44)
AST: 27 U/L (ref 15–41)
Albumin: 3.4 g/dL — ABNORMAL LOW (ref 3.5–5.0)
Alkaline Phosphatase: 66 U/L (ref 38–126)
Anion gap: 18 — ABNORMAL HIGH (ref 5–15)
BUN: 22 mg/dL (ref 8–23)
CO2: 16 mmol/L — ABNORMAL LOW (ref 22–32)
Calcium: 8.9 mg/dL (ref 8.9–10.3)
Chloride: 103 mmol/L (ref 98–111)
Creatinine, Ser: 1.6 mg/dL — ABNORMAL HIGH (ref 0.61–1.24)
GFR calc Af Amer: 48 mL/min — ABNORMAL LOW (ref >60)
GFR calc non Af Amer: 42 mL/min — ABNORMAL LOW (ref >60)
Glucose, Bld: 242 mg/dL — ABNORMAL HIGH (ref 70–99)
Potassium: 3.5 mmol/L (ref 3.5–5.1)
Sodium: 137 mmol/L (ref 135–145)
Total Bilirubin: 1.5 mg/dL — ABNORMAL HIGH (ref 0.3–1.2)
Total Protein: 6.9 g/dL (ref 6.5–8.1)

## 2019-06-09 LAB — TROPONIN I (HIGH SENSITIVITY)
Troponin I (High Sensitivity): 11.3 ng/L (ref <18)
Troponin I (High Sensitivity): 12 ng/L (ref <18)

## 2019-06-09 LAB — CBG MONITORING, ED
Glucose-Capillary: 304 mg/dL — ABNORMAL HIGH (ref 70–99)
Glucose-Capillary: 309 mg/dL — ABNORMAL HIGH (ref 70–99)

## 2019-06-09 LAB — LACTIC ACID, PLASMA
Lactic Acid, Venous: 1.4 mmol/L (ref 0.5–1.9)
Lactic Acid, Venous: 1.9 mmol/L (ref 0.5–1.9)

## 2019-06-09 LAB — CK: Total CK: 89 U/L (ref 49–397)

## 2019-06-09 LAB — SARS CORONAVIRUS 2 (TAT 6-24 HRS): SARS Coronavirus 2: NEGATIVE

## 2019-06-09 LAB — LIPASE, BLOOD: Lipase: 34 U/L (ref 11–51)

## 2019-06-09 LAB — ETHANOL: Alcohol, Ethyl (B): <10 mg/dL (ref <10)

## 2019-06-09 MED ORDER — LACTATED RINGERS IV BOLUS
1000.0000 mL | INTRAVENOUS | Status: AC
  Administered 2019-06-09: 1000 mL via INTRAVENOUS

## 2019-06-09 MED ORDER — IOHEXOL 9 MG/ML PO SOLN
ORAL | Status: AC
  Filled 2019-06-09: qty 1000

## 2019-06-09 MED ORDER — ASPIRIN EC 81 MG PO TBEC
81.0000 mg | DELAYED_RELEASE_TABLET | Freq: Every day | ORAL | Status: DC
  Administered 2019-06-09 – 2019-06-10 (×2): 81 mg via ORAL
  Filled 2019-06-09 (×2): qty 1

## 2019-06-09 MED ORDER — SODIUM BICARBONATE-DEXTROSE 150-5 MEQ/L-% IV SOLN
150.0000 meq | INTRAVENOUS | Status: DC
  Administered 2019-06-09: 150 meq via INTRAVENOUS
  Filled 2019-06-09 (×3): qty 1000

## 2019-06-09 MED ORDER — DEXTROSE-NACL 5-0.45 % IV SOLN
INTRAVENOUS | Status: DC
  Administered 2019-06-10: 01:00:00 via INTRAVENOUS

## 2019-06-09 MED ORDER — SODIUM CHLORIDE 0.9 % IV BOLUS
1000.0000 mL | Freq: Once | INTRAVENOUS | Status: AC
  Administered 2019-06-09: 1000 mL via INTRAVENOUS

## 2019-06-09 MED ORDER — ACETAMINOPHEN 325 MG PO TABS: 650.0000 mg | ORAL_TABLET | Freq: Four times a day (QID) | ORAL | Status: DC | PRN

## 2019-06-09 MED ORDER — CHLORHEXIDINE GLUCONATE CLOTH 2 % EX PADS
6.0000 | MEDICATED_PAD | Freq: Every day | CUTANEOUS | Status: DC
  Administered 2019-06-09 – 2019-06-13 (×5): 6 via TOPICAL

## 2019-06-09 MED ORDER — ONDANSETRON HCL 4 MG/2ML IJ SOLN: 4.0000 mg | Freq: Four times a day (QID) | INTRAMUSCULAR | Status: DC | PRN

## 2019-06-09 MED ORDER — PIPERACILLIN-TAZOBACTAM 3.375 G IVPB
3.3750 g | Freq: Three times a day (TID) | INTRAVENOUS | Status: DC
  Administered 2019-06-10 (×2): 3.375 g via INTRAVENOUS
  Filled 2019-06-09: qty 50

## 2019-06-09 MED ORDER — SODIUM CHLORIDE 0.9 % IV SOLN
INTRAVENOUS | Status: DC
  Administered 2019-06-09: 22:00:00 via INTRAVENOUS

## 2019-06-09 MED ORDER — POTASSIUM CHLORIDE 10 MEQ/100ML IV SOLN
10.0000 meq | INTRAVENOUS | Status: AC
  Administered 2019-06-09 – 2019-06-10 (×2): 10 meq via INTRAVENOUS
  Filled 2019-06-09: qty 100

## 2019-06-09 MED ORDER — TECHNETIUM TO 99M ALBUMIN AGGREGATED
1.5000 | Freq: Once | INTRAVENOUS | Status: AC | PRN
  Administered 2019-06-09: 1.5 via INTRAVENOUS

## 2019-06-09 MED ORDER — METHOCARBAMOL 500 MG PO TABS
500.0000 mg | ORAL_TABLET | Freq: Once | ORAL | Status: AC
  Administered 2019-06-09: 500 mg via ORAL
  Filled 2019-06-09: qty 1

## 2019-06-09 MED ORDER — VANCOMYCIN HCL IN DEXTROSE 750-5 MG/150ML-% IV SOLN: 750.0000 mg | INTRAVENOUS | Status: DC

## 2019-06-09 MED ORDER — HEPARIN SODIUM (PORCINE) 5000 UNIT/ML IJ SOLN
5000.0000 [IU] | Freq: Three times a day (TID) | INTRAMUSCULAR | Status: DC
  Administered 2019-06-09 – 2019-06-11 (×5): 5000 [IU] via SUBCUTANEOUS
  Filled 2019-06-09 (×7): qty 1

## 2019-06-09 MED ORDER — SODIUM BICARBONATE 8.4 % IV SOLN
INTRAVENOUS | Status: DC
  Filled 2019-06-09 (×3): qty 1000

## 2019-06-09 MED ORDER — INSULIN REGULAR(HUMAN) IN NACL 100-0.9 UT/100ML-% IV SOLN
INTRAVENOUS | Status: DC
  Administered 2019-06-09: 9 [IU]/h via INTRAVENOUS
  Filled 2019-06-09 (×2): qty 100

## 2019-06-09 MED ORDER — ACETAMINOPHEN 325 MG PO TABS
650.0000 mg | ORAL_TABLET | Freq: Once | ORAL | Status: AC
  Administered 2019-06-09: 650 mg via ORAL
  Filled 2019-06-09: qty 2

## 2019-06-09 MED ORDER — VANCOMYCIN HCL IN DEXTROSE 1-5 GM/200ML-% IV SOLN
1000.0000 mg | Freq: Once | INTRAVENOUS | Status: AC
  Administered 2019-06-09: 1000 mg via INTRAVENOUS
  Filled 2019-06-09: qty 200

## 2019-06-09 MED ORDER — PIPERACILLIN-TAZOBACTAM 3.375 G IVPB
3.3750 g | Freq: Three times a day (TID) | INTRAVENOUS | Status: DC
  Administered 2019-06-10: 3.375 g via INTRAVENOUS
  Filled 2019-06-09: qty 50

## 2019-06-09 MED ORDER — DEXTROSE 50 % IV SOLN: 0.0000 mL | INTRAVENOUS | Status: DC | PRN

## 2019-06-09 NOTE — H&P (Signed)
History and Physical    [name] HQI:[id number] DOB: March 16, 1946 DOA: 05/25/2019  PCP: Jerlyn Ly, MD  Patient coming from: Home  I have personally briefly reviewed patient's old medical records in [facility]  Chief Complaint: Generalized weakness  HPI: [name] [name] is a 73 y.o. male with medical history significant of diabetes, hypertension, hypertrophic cardiomyopathy, presents to the emergency room with increasing shortness of breath, generalized weakness, left shoulder pain that radiates into his left arm, left neck and down his left torso.  This discomfort is worse when he tries to get up and move/lift something with his arm.  He has not had any cough.  His wife does report him having a fever of 101F earlier today.  He describes some lower abdominal pain for the past few days.  He has had 2-3 bowel movements today, but they were not reported to be loose.  He has had no nausea or vomiting.  He does occasionally have dysuria.  ED Course: On arrival to the emergency room, he was noted to be significantly tachycardic with a heart rate in the 120s to 130s, respiratory rate in the 30s.  He is hypertensive.  Chest x-ray and CT scan of the chest did not show any acute findings.  No evidence of pneumonia.  VQ scan done in the emergency room was found to be low probability for PE.  He was noted to be febrile in the emergency room.  Urinalysis does show evidence of ketones.  Bicarb noted to be 16 with an anion gap of 18.  Blood sugar is just under 250.  Creatinine is elevated at 1.7.  Patient received 2 L of fluid in the emergency room, remains hypertensive and tachycardic  Review of Systems:  GEN: positive for fever, weight loss, malaise Respiratory: Positive for shortness of breath, negative for cough and wheezing Cardiac: Negative for chest pain, palpitations Musculoskeletal.  Positive for left shoulder pain, neck pain, torso pain, left arm pain GI: Positive for diarrhea, negative  for nausea, vomiting, positive for lower abdominal pain All other systems reviewed and found to be negative.   Past Medical History:  Diagnosis Date  . Bleeding acute gastric ulcer 08/2017  . Coronary artery disease    a. 10/2007 s/p PCI of LAD (3.5x23 Vision BMS); b. 05/2008 Cath/PCI: LM nl, LAD 57m, 80 ISR (3.5x28 Promus DES), LCX min irregs, RI nl;  c. 12/2009 MV: EF 61%, no ischemia.  . Diabetes mellitus   . Dyslipidemia   . Hypertension   . Hypertrophic cardiomyopathy ([facility])    a. 03/2014 Echo: EF 60-65%, Gr 1 DD, sev septal basal hypertrophy, no SAM but near mid-cavitary obstruction, mild MR.    Past Surgical History:  Procedure Laterality Date  . BACK SURGERY  1992  . CORONARY STENT PLACEMENT  06/03/2008   Promus stent to mid LAD; 10/23/2007 Vision stent to mid LAD  . ESOPHAGOGASTRODUODENOSCOPY N/A 09/06/2017   Procedure: ESOPHAGOGASTRODUODENOSCOPY (EGD);  Surgeon: Iva Boop, MD;  Location: [facility] ENDOSCOPY;  Service: Endoscopy;  Laterality: N/A;  . FEMORAL ENDARTERECTOMY  2010   right, with bovine patch angioplasty;  post cath, r./t aneursym/claudication  . INGUINAL HERNIA REPAIR Left 08/06/2013   Procedure: HERNIA REPAIR INGUINAL ADULT;  Surgeon: [name], MD;  Location: [facility];  Service: General;  Laterality: Left;  . TRANSTHORACIC ECHOCARDIOGRAM  01/12/2010   EF>55%; mild concentric LVH; mild MR, mild TR    Social History:  reports that he has never smoked. He has  never used smokeless tobacco. He reports that he does not drink alcohol or use drugs.  No Known Allergies  Family History  Problem Relation Age of Onset  . Kidney disease Mother   . Heart Problems Mother   . Colon cancer Brother        >60, deceased from colon ca    Prior to Admission medications   Medication Sig Start Date End Date Taking? Authorizing Provider  amLODipine (NORVASC) 5 MG tablet TAKE ONE (1) TABLET EACH DAY 05/22/19  Yes Hilty, Lisette Abu, MD  aspirin EC 81 MG tablet Take 81 mg by  mouth daily.   Yes [provider]  Empagliflozin-linaGLIPtin (GLYXAMBI) 10-5 MG TABS Take 1 tablet by mouth daily. 09/30/18  Yes [provider]  ferrous sulfate 325 (65 FE) MG tablet Take 1 tablet (325 mg total) by mouth 2 (two) times daily with a meal. 09/06/17  Yes Berton Mount I, MD  fosinopril (MONOPRIL) 40 MG tablet TAKE ONE (1) TABLET EACH DAY 03/31/18  Yes Hilty, Lisette Abu, MD  glipiZIDE (GLUCOTROL) 10 MG tablet Take 1 tablet by mouth daily. 07/05/17  Yes [provider]  metFORMIN (GLUCOPHAGE) 1000 MG tablet TAKE ONE TABLET BY MOUTH TWICE DAILY 11/25/18  Yes Hilty, Lisette Abu, MD  Multiple Vitamins-Minerals (MULTIVITAMIN PO) Take by mouth daily.   Yes [provider]  pantoprazole (PROTONIX) 40 MG tablet Take 1 tablet (40 mg total) by mouth daily before breakfast. 10/31/17  Yes Iva Boop, MD  rosuvastatin (CRESTOR) 10 MG tablet Take 10 mg by mouth daily.   Yes [provider]    Physical Exam: Vitals:   06/13/2019 1800 05/28/2019 1830 06/11/2019 1900 06/12/2019 1930  BP: (!) 149/71 (!) 161/86 (!) 146/77 (!) 157/132  Pulse: (!) 133 (!) 137 (!) 135 (!) 139  Resp:   (!) 48 (!) 46  Temp:      TempSrc:      SpO2: 96% 96% 98% 99%  Weight: 61.2 kg     Height:  (1.803 m)       Constitutional: Laying in bed, short of breath with conversation Eyes: PERRL, lids and conjunctivae normal ENMT: Mucous membranes are moist. Posterior pharynx clear of any exudate or lesions.Normal dentition.  Neck: normal, supple, no masses, no thyromegaly Respiratory: clear to auscultation bilaterally, no wheezing, no crackles.  Increased respiratory rate. No accessory muscle use.  Cardiovascular: regular, tachycardic, no murmurs / rubs / gallops. No extremity edema. 2+ pedal pulses. No carotid bruits.  Abdomen: Lower abdominal tenderness, no masses palpated. No hepatosplenomegaly. Bowel sounds positive.  Musculoskeletal: no clubbing / cyanosis. No joint deformity  upper and lower extremities. Good ROM, no contractures. Normal muscle tone.  No tenderness over left shoulder, left chest, neck. Skin: no rashes, lesions, ulcers. No induration Neurologic: CN 2-12 grossly intact. Sensation intact, DTR normal. Strength 5/5 in all 4.  Psychiatric: Normal judgment and insight. Alert and oriented x 3. Normal mood.    Labs on Admission: I have personally reviewed following labs and imaging studies  CBC: Recent Labs  Lab 06/13/2019 0337  WBC 4.1  NEUTROABS 3.5  HGB 11.7*  HCT 35.9*  MCV 88.9  PLT 211   Basic Metabolic Panel: Recent Labs  Lab 06/21/2019 0337 06/16/2019 0837  NA 137 140  K 3.5 4.7  CL 103 110  CO2 16* 15*  GLUCOSE 242* 197*  BUN 22 25*  CREATININE 1.60* 1.74*  CALCIUM 8.9 8.3*   GFR: Estimated Creatinine Clearance: 32.2 mL/min (  A) (by C-G formula based on SCr of 1.74 mg/dL (H)). Liver Function Tests: Recent Labs  Lab 06/13/2019 0337  AST 27  ALT 30  ALKPHOS 66  BILITOT 1.5*  PROT 6.9  ALBUMIN 3.4*   Recent Labs  Lab 05/25/2019 0337  LIPASE 34   No results for input(s): AMMONIA in the last 168 hours. Coagulation Profile: No results for input(s): INR, PROTIME in the last 168 hours. Cardiac Enzymes: Recent Labs  Lab 06/14/2019 0535  CKTOTAL 89   BNP (last 3 results) No results for input(s): PROBNP in the last 8760 hours. HbA1C: No results for input(s): HGBA1C in the last 72 hours. CBG: Recent Labs  Lab 06/18/2019 1846  GLUCAP 304*   Lipid Profile: No results for input(s): CHOL, HDL, LDLCALC, TRIG, CHOLHDL, LDLDIRECT in the last 72 hours. Thyroid Function Tests: No results for input(s): TSH, T4TOTAL, FREET4, T3FREE, THYROIDAB in the last 72 hours. Anemia Panel: No results for input(s): VITAMINB12, FOLATE, FERRITIN, TIBC, IRON, RETICCTPCT in the last 72 hours. Urine analysis:    Component Value Date/Time   COLORURINE YELLOW 06/19/2019 0221   APPEARANCEUR CLOUDY (A) 06/10/2019 0221   LABSPEC 1.020 06/07/2019 0221    PHURINE 5.0 05/25/2019 0221   GLUCOSEU >=500 (A) 06/21/2019 0221   HGBUR MODERATE (A) 06/19/2019 0221   HGBUR negative 09/23/2007 0907   BILIRUBINUR NEGATIVE 06/03/2019 0221   KETONESUR 20 (A) 06/04/2019 0221   PROTEINUR 30 (A) 06/10/2019 0221   UROBILINOGEN 0.2 07/28/2011 1456   NITRITE NEGATIVE 06/04/2019 0221   LEUKOCYTESUR NEGATIVE 05/31/2019 0221    Radiological Exams on Admission: Ct Chest Wo Contrast  Result Date: 05/26/2019 CLINICAL DATA:  Left-sided chest pain. Shortness of breath. EXAM: CT CHEST WITHOUT CONTRAST TECHNIQUE: Multidetector CT imaging of the chest was performed following the standard protocol without IV contrast. COMPARISON:  Chest x-ray dated 05/29/2019 and CT scan of the abdomen dated 09/05/2017 FINDINGS: Cardiovascular: Aortic atherosclerosis. Coronary artery calcifications. Heart size is normal. No pericardial effusion. Mediastinum/Nodes: No enlarged mediastinal or axillary lymph nodes. Thyroid gland, trachea, and esophagus demonstrate no significant findings. Lungs/Pleura: There are no infiltrates. There is a small left pleural effusion. Tiny right effusion. Minimal secondary atelectasis at the lung bases. The lungs are otherwise clear. Upper Abdomen: There is chronic bilateral adrenal enlargement. Tiny amount of ascites in the left upper quadrant. Musculoskeletal: No chest wall mass or suspicious bone lesions identified. IMPRESSION: 1. Small left and tiny right pleural effusions with secondary atelectasis at the lung bases. 2. Chronic bilateral adrenal enlargement. 3. Tiny amount of ascites in the left upper quadrant. 4. Aortic Atherosclerosis (ICD10-I70.0). Electronically Signed   By: Lorriane Shire M.D.   On: 06/05/2019 12:14   Nm Pulmonary Perfusion  Result Date: 06/19/2019 CLINICAL DATA:  Chest pain and shortness of breath EXAM: NUCLEAR MEDICINE PERFUSION LUNG SCAN TECHNIQUE: Perfusion images were obtained in multiple projections after intravenous injection of  radiopharmaceutical. Ventilation scans intentionally deferred if perfusion scan and chest x-ray adequate for interpretation during COVID 19 epidemic. Views: Anterior, posterior, left lateral, right lateral, RPO, LPO, RAO, LAO RADIOPHARMACEUTICALS:  1.5 mCi Tc-68m MAA IV COMPARISON:  Chest radiograph and chest CT June 09, 2019 FINDINGS: Radiotracer uptake is homogeneous and symmetric bilaterally. No perfusion defects evident. IMPRESSION: No appreciable perfusion defects. Very low probability of pulmonary embolus. Electronically Signed   By: Lowella Grip III M.D.   On: 06/14/2019 15:21   Dg Chest Port 1 View  Result Date: 06/06/2019 CLINICAL DATA:  73 year old male with left side  chest pain, fatigue. EXAM: PORTABLE CHEST 1 VIEW COMPARISON:  Chest radiographs 05/21/2018 and earlier. FINDINGS: Portable AP upright view at 0243 hours. Stable lung volumes. Mediastinal contours remain normal. Visualized tracheal air column is within normal limits. Allowing for portable technique the lungs are clear. No pneumothorax. Negative visible bowel gas pattern. No acute osseous abnormality identified. IMPRESSION: Negative portable chest. Electronically Signed   By: Odessa FlemingH  Hall M.D.   On: 23-Mar-2019 02:56    EKG: Independently reviewed.  Sinus rhythm without acute changes  Assessment/Plan Active Problems:   Hyperlipidemia   Hypertrophic cardiomyopathy ([facility]CC)   Hypertension   Type II diabetes mellitus (HCC)   Metabolic acidosis   DKA (diabetic ketoacidoses) (HCC)   AKI (acute kidney injury) (HCC)     1. Metabolic acidosis, secondary to diabetic ketoacidosis.  Patient had elevated anion gap upon arrival to the emergency room.  He had low bicarb and now has elevated blood sugar.  Beta hydroxybutyric acid is also elevated. He is taking empagliflozin which can predispose to DKA. Will start on IV fluid resuscitation and IV insulin with DKA protocol 2. SIRS. Likely related to DKA. Unclear if he has sepsis. Blood  cultures have been sent. Will start on empiric antibiotics until infection can be ruled out 3. Abdominal pain. Unclear etiology. LFTs and lipase unremarkable. Will check CT abdomen 4. Respiratory distress/respiratory alkalosis. Secondary to metabolic acidosis. This should improve as patient's acidosis corrects 5. DM2. He is on 3 oral agents, metformin, glipizide, empagliflozin. Will hold for now and start on insulin infusion. Check A1c 6. HTN. Hold ace inhibitors due to renal failure. Blood pressure is elevated. Use hydralazine PRN 7. Hypertrophic cardiomyopathy 8. AKI. Likely related to dehydration. Hold ACE. Continue IV fluids  DVT prophylaxis: lovenox  Code Status: full code  Family Communication: discussed with wife at bedside  Disposition Plan: discharge home once acidosis is corrected  Consults called:   Admission status: observation, stepdown   Erick BlinksJehanzeb Memon MD [facility]   If 7PM-7AM, please contact night-coverage [email or web address]   23-Mar-2019, 7:57 PM

## 2019-06-09 NOTE — ED Provider Notes (Signed)
[facility] EMERGENCY DEPARTMENT Provider Note   CSN: [id number] Arrival date & time: 05/24/2019  0134   Time seen 1:10 AM  History   Chief Complaint Chief Complaint  Patient presents with   Weakness    HPI [name] [name] is a 73 y.o. male.     HPI patient states he had Covid and when I review care everywhere he had a positive test on August 29.  He states he was treated at home.  He states for the last couple days he had some lower abdominal pain that is bilateral.  He describes it as cramping.  He denies nausea, vomiting, or diarrhea but states today he went to the bathroom and did not realize that he was going to have a bowel movement.  He denies it being loose or watery.  He repeats multiple times he has a bitter taste in his mouth.  He denies fever or cough but does describe chills last night.  He denies dysuria but states he has frequency but he has been drinking more water.  He also complains of pain in his left lateral chest that goes into his shoulder and down into his left lower quadrant into his hip.  He states breathing makes the pain worse and he describes it as a pressure feeling.  He states he feels weak but denies shortness of breath.  He states he had something similar a few months ago when he was admitted for syncope but he denies syncope with this episode.  PCP Hollar, Larina Earthly, MD   Past Medical History:  Diagnosis Date   Bleeding acute gastric ulcer 08/2017   Coronary artery disease    a. 10/2007 s/p PCI of LAD (3.5x23 Vision BMS); b. 05/2008 Cath/PCI: LM nl, LAD 59m 80 ISR (3.5x28 Promus DES), LCX min irregs, RI nl;  c. 12/2009 MV: EF 61%, no ischemia.   Diabetes mellitus    Dyslipidemia    Hypertension    Hypertrophic cardiomyopathy ([place]    a. 03/2014 Echo: EF 60-65%, Gr 1 DD, sev septal basal hypertrophy, no SAM but near mid-cavitary obstruction, mild MR.    Patient Active Problem List   Diagnosis Date Noted   Syncope 05/21/2018   Hypertrophic  cardiomyopathy ([place] 09/27/2017   Dyslipidemia 09/27/2017   Coronary artery disease 09/27/2017   Acute gastric ulcer with hemorrhage 09/27/2017   Heme + stool    Decreased hemoglobin    Palpitations 09/04/2017   Abnormal EKG 02/06/2016   Weakness 02/06/2016   Hypertension    Type II diabetes mellitus ([place]    Inguinal hernia 08/06/2013   Preoperative cardiovascular examination 07/29/2013   CAD (coronary artery disease) 02/26/2013   INSECT BITE 03/07/2009   ANEMIA, NORMOCYTIC 10/27/2008   SYNCOPE 08/07/2007   ECHOCARDIOGRAM, ABNORMAL 08/07/2007   DIABETES MELLITUS, TYPE II, CONTROLLED 06/16/2007   ELECTROCARDIOGRAM, ABNORMAL 03/28/2007   [place]ATIVE [place] 01/01/2007   ERECTILE DYSFUNCTION 12/27/2006   Hyperlipidemia 09/06/2006   Essential hypertension 09/06/2006   CONSTIPATION 09/06/2006    Past Surgical History:  Procedure Laterality Date   [place] 06/03[place]2008   Promus stent to mid LAD; 10/23/2007 Vision stent to mid LAD   ESOPHAGOGASTRODUODENOSCOPY N/A 09/06/2017   Procedure: ESOPHAGOGASTRODUODENOSCOPY (EGD);  Surgeon: GGatha Mayer MD;  [facility];  Service: Endoscopy;  Laterality: N/A;   FEMORAL ENDARTERECTOMY  2010   right, with bovine patch angioplasty;  post cath, r./t aneursym/claudication   INGUINAL HERNIA REPAIR Left 08/06/2013  Procedure: HERNIA REPAIR INGUINAL ADULT;  Surgeon: Scherry Ran, MD;  Location: [facility];  Service: General;  Laterality: Left;   TRANSTHORACIC ECHOCARDIOGRAM  01/12/2010   EF>55%; mild concentric LVH; mild MR, mild TR        Home Medications    Prior to Admission medications   Medication Sig Start Date End Date Taking? Authorizing Provider  amLODipine (NORVASC) 5 MG tablet TAKE ONE (1) TABLET EACH DAY 05/22/19   Hilty, Nadean Corwin, MD  aspirin EC 81 MG tablet Take 81 mg by mouth daily.    [provider]  Empagliflozin-linaGLIPtin  (GLYXAMBI) 10-5 MG TABS Take 1 tablet by mouth daily. 09/30/18   [provider]  ferrous sulfate 325 (65 FE) MG tablet Take 1 tablet (325 mg total) by mouth 2 (two) times daily with a meal. 09/06/17   Bonnell Public, MD  fosinopril (MONOPRIL) 40 MG tablet TAKE ONE (1) TABLET EACH DAY 03/31/18   Hilty, Nadean Corwin, MD  glipiZIDE (GLUCOTROL) 10 MG tablet Take 1 tablet by mouth daily. 07/05/17   [provider]  metFORMIN (GLUCOPHAGE) 1000 MG tablet TAKE ONE TABLET BY MOUTH TWICE DAILY 11/25/18   Hilty, Nadean Corwin, MD  Multiple Vitamins-Minerals (MULTIVITAMIN PO) Take by mouth daily.    [provider]  pantoprazole (PROTONIX) 40 MG tablet Take 1 tablet (40 mg total) by mouth daily before breakfast. 10/31/17   Gatha Mayer, MD  rosuvastatin (CRESTOR) 10 MG tablet Take 10 mg by mouth daily.    [provider]    Family History Family History  Problem Relation Age of Onset   Kidney disease Mother    Heart Problems Mother    Colon cancer Brother        >110, deceased from colon ca    Social History Social History   Tobacco Use   Smoking status: Never Smoker   Smokeless tobacco: Never Used  Substance Use Topics   Alcohol use: No   Drug use: No  Lives at home Lives with spouse   Allergies   Patient has no known allergies.   Review of Systems Review of Systems  All other systems reviewed and are negative.    Physical Exam Updated Vital Signs BP (!) 159/73    Pulse 70    Temp 99.8 F (37.7 C) (Oral)    Resp (!) 30    SpO2 98%   Physical Exam Vitals signs and nursing note reviewed.  Constitutional:      General: He is not in acute distress.    Appearance: Normal appearance. He is well-developed. He is not ill-appearing or toxic-appearing.     Comments: Frail elderly male  HENT:     Head: Normocephalic and atraumatic.     Right Ear: External ear normal.     Left Ear: External ear normal.     Nose: Nose normal. No mucosal edema or  rhinorrhea.     Mouth/Throat:     Mouth: Mucous membranes are dry.     Dentition: No dental abscesses.     Pharynx: No uvula swelling.  Eyes:     Extraocular Movements: Extraocular movements intact.     Conjunctiva/sclera: Conjunctivae normal.     Pupils: Pupils are equal, round, and reactive to light.  Neck:     Musculoskeletal: Full passive range of motion without pain, normal range of motion and neck supple.  Cardiovascular:     Rate and Rhythm: Normal rate and regular rhythm.  Heart sounds: Normal heart sounds. No murmur. No friction rub. No gallop.   Pulmonary:     Effort: Pulmonary effort is normal. No respiratory distress.     Breath sounds: Normal breath sounds. No wheezing, rhonchi or rales.  Chest:     Chest wall: No tenderness or crepitus.  Abdominal:     General: Bowel sounds are normal. There is no distension.     Palpations: Abdomen is soft.     Tenderness: There is abdominal tenderness. There is no guarding or rebound.       Comments: Patient has tenderness in his lower abdomen slightly worse on the right than the left  Musculoskeletal: Normal range of motion.        General: No tenderness.     Comments: Moves all extremities well.   Skin:    General: Skin is warm and dry.     Coloration: Skin is not pale.     Findings: No erythema or rash.  Neurological:     General: No focal deficit present.     Mental Status: He is alert and oriented to person, place, and time.     Cranial Nerves: No cranial nerve deficit.  Psychiatric:        Mood and Affect: Affect is flat.        Speech: Speech is delayed.        Behavior: Behavior is slowed.      ED Treatments / Results  Labs (all labs ordered are listed, but only abnormal results are displayed) Results for orders placed or performed during the hospital encounter of 05/24/2019  Comprehensive metabolic panel  Result Value Ref Range   Sodium 137 135 - 145 mmol/L   Potassium 3.5 3.5 - 5.1 mmol/L   Chloride 103  98 - 111 mmol/L   CO2 16 (L) 22 - 32 mmol/L   Glucose, Bld 242 (H) 70 - 99 mg/dL   BUN 22 8 - 23 mg/dL   Creatinine, Ser 1.60 (H) 0.61 - 1.24 mg/dL   Calcium 8.9 8.9 - 10.3 mg/dL   Total Protein 6.9 6.5 - 8.1 g/dL   Albumin 3.4 (L) 3.5 - 5.0 g/dL   AST 27 15 - 41 U/L   ALT 30 0 - 44 U/L   Alkaline Phosphatase 66 38 - 126 U/L   Total Bilirubin 1.5 (H) 0.3 - 1.2 mg/dL   GFR calc non Af Amer 42 (L) >60 mL/min   GFR calc Af Amer 48 (L) >60 mL/min   Anion gap 18 (H) 5 - 15  CBC with Differential  Result Value Ref Range   WBC 4.1 4.0 - 10.5 K/uL   RBC 4.04 (L) 4.22 - 5.81 MIL/uL   Hemoglobin 11.7 (L) 13.0 - 17.0 g/dL   HCT 35.9 (L) 39.0 - 52.0 %   MCV 88.9 80.0 - 100.0 fL   MCH 29.0 26.0 - 34.0 pg   MCHC 32.6 30.0 - 36.0 g/dL   RDW 12.6 11.5 - 15.5 %   Platelets 211 150 - 400 K/uL   nRBC 0.0 0.0 - 0.2 %   Neutrophils Relative % 84 %   Neutro Abs 3.5 1.7 - 7.7 K/uL   Lymphocytes Relative 9 %   Lymphs Abs 0.4 (L) 0.7 - 4.0 K/uL   Monocytes Relative 6 %   Monocytes Absolute 0.3 0.1 - 1.0 K/uL   Eosinophils Relative 0 %   Eosinophils Absolute 0.0 0.0 - 0.5 K/uL   Basophils Relative 1 %  Basophils Absolute 0.0 0.0 - 0.1 K/uL   Immature Granulocytes 0 %   Abs Immature Granulocytes 0.00 0.00 - 0.07 K/uL  Lipase, blood  Result Value Ref Range   Lipase 34 11 - 51 U/L  Urinalysis, Routine w reflex microscopic  Result Value Ref Range   Color, Urine YELLOW YELLOW   APPearance CLOUDY (A) CLEAR   Specific Gravity, Urine 1.020 1.005 - 1.030   pH 5.0 5.0 - 8.0   Glucose, UA >=500 (A) NEGATIVE mg/dL   Hgb urine dipstick MODERATE (A) NEGATIVE   Bilirubin Urine NEGATIVE NEGATIVE   Ketones, ur 20 (A) NEGATIVE mg/dL   Protein, ur 30 (A) NEGATIVE mg/dL   Nitrite NEGATIVE NEGATIVE   Leukocytes,Ua NEGATIVE NEGATIVE   RBC / HPF 0-5 0 - 5 RBC/hpf   WBC, UA 0-5 0 - 5 WBC/hpf   Bacteria, UA NONE SEEN NONE SEEN   Squamous Epithelial / LPF 0-5 0 - 5   Mucus PRESENT    Budding Yeast PRESENT      Uric Acid Crys, UA PRESENT   Lactic acid, plasma  Result Value Ref Range   Lactic Acid, Venous 1.4 0.5 - 1.9 mmol/L  Lactic acid, plasma  Result Value Ref Range   Lactic Acid, Venous 1.9 0.5 - 1.9 mmol/L  CK  Result Value Ref Range   Total CK 89 49 - 397 U/L  Troponin I (High Sensitivity)  Result Value Ref Range   Troponin I (High Sensitivity) 11.3 <18 ng/L  Troponin I (High Sensitivity)  Result Value Ref Range   Troponin I (High Sensitivity) 12 <18 ng/L    Laboratory interpretation all normal except hyperglycemia, with elevation of anion gap without lactic acidosis.   EKG EKG Interpretation  Date/Time:  Tuesday June 09 2019 03:09:16 EST Ventricular Rate:  98 PR Interval:    QRS Duration: 101 QT Interval:  354 QTC Calculation: 452 R Axis:   7 Text Interpretation: Sinus rhythm Consider right atrial enlargement Borderline low voltage, extremity leads Since last tracing rate faster 22 May 2018 Abnormal R-wave progression, late transition ST elevation suggests acute pericarditis Baseline wander in lead(s) III aVL Confirmed by Rolland Porter [phone]) on 05/29/2019 3:40:57 AM     Radiology Dg Chest Port 1 View  Result Date: 06/16/2019 CLINICAL DATA:  73 year old male with left side chest pain, fatigue. EXAM: PORTABLE CHEST 1 VIEW COMPARISON:  Chest radiographs 05/21/2018 and earlier. FINDINGS: Portable AP upright view at 0243 hours. Stable lung volumes. Mediastinal contours remain normal. Visualized tracheal air column is within normal limits. Allowing for portable technique the lungs are clear. No pneumothorax. Negative visible bowel gas pattern. No acute osseous abnormality identified. IMPRESSION: Negative portable chest. Electronically Signed   By: Genevie Ann M.D.   On: 05/27/2019 02:56    Procedures Procedures (including critical care time)  Medications Ordered in ED Medications  sodium chloride 0.9 % bolus 1,000 mL (has no administration in time range)  methocarbamol  (ROBAXIN) tablet 500 mg (has no administration in time range)  sodium chloride 0.9 % bolus 1,000 mL (1,000 mLs Intravenous New Bag/Given 06/13/2019 0300)     Initial Impression / Assessment and Plan / ED Course  I have reviewed the triage vital signs and the nursing notes.  Pertinent labs & imaging results that were available during my care of the patient were reviewed by me and considered in my medical decision making (see chart for details).       Patient was given IV fluids, laboratory testing  was done.  Recheck at 6:40 AM patient states he is feeling better as far as the weakness.  He still has now muscle spasms in his left chest and arm.  He was given Robaxin orally.  He was given a second liter of IV fluids.  I think after that is in I will have his lab repeated and see if his anion gap has resolved.  I do not suspect he is in significant DKA.  His Covid test was done, he was + August 29.   Patient turned over to Dr. Bobby Rumpf at change of shift to finish his second liter of IV fluids and get a repeat be met to see if his metabolic acidosis has resolved.  [name] [name] was evaluated in Emergency Department on 06/21/2019 for the symptoms described in the history of present illness. He was evaluated in the context of the global COVID-19 pandemic, which necessitated consideration that the patient might be at risk for infection with the SARS-CoV-2 virus that causes COVID-19. Institutional protocols and algorithms that pertain to the evaluation of patients at risk for COVID-19 are in a state of rapid change based on information released by regulatory bodies including the CDC and federal and state organizations. These policies and algorithms were followed during the patient's care in the ED.   Final Clinical Impressions(s) / ED Diagnoses   Final diagnoses:  Generalized weakness  Muscle pain    Disposition pending  Rolland Porter, MD, Barbette Or, MD 06/20/2019 [phone]

## 2019-06-09 NOTE — Progress Notes (Signed)
Pharmacy Antibiotic Note  [name] [name] is a 73 y.o. male admitted on 06/19/2019 with sepsissuspected GI source.  Pharmacy has been consulted for vancomycin and pip/tazo dosing. Patient with poor renal function, unknown baseline function, likely AKI on CKD.   SIRS+ Tmax 99.9; WBC 4.1, LA 1.9; HR 125, RR 30-40  CT abd: pend CT Chest: Small left and tiny right pleural effusions with secondary atelectasis at the lung bases CXR: negative acute issue VQ scan: low prob   11/17 Vancomycin 750mg  Q24hr Scr used: 1.74 Est AUC: 457   Plan: Vancomycin 1g load then 750mg  Q 24 hr Pip/tazo 3.375g Q8hr  F/u cultures, CT abd, clinical status Narrow abx as able   Height: 5\' 11"  (180.3 cm)(Previous charting) Weight: 135 lb (61.2 kg)(05/12/19 charting) IBW/kg (Calculated) : 75.3  Temp (24hrs), Avg:99.9 F (37.7 C), Min:99.8 F (37.7 C), Max:99.9 F (37.7 C)  Recent Labs  Lab 06/05/2019 0337 06/04/2019 0535 06/08/2019 0837  WBC 4.1  --   --   CREATININE 1.60*  --  1.74*  LATICACIDVEN 1.4 1.9  --     Estimated Creatinine Clearance: 32.2 mL/min (A) (by C-G formula based on SCr of 1.74 mg/dL (H)).    No Known Allergies  Antimicrobials this admission: Vanc 11/17 >> Pip/tazo 11/17 >>   Microbiology results: 11/17 BCx: pend 11/17 COVID neg  Thank you for allowing pharmacy to be a part of this patient's care.  Benetta Spar, PharmD, BCPS, BCCP Clinical Pharmacist  Please check AMION for all [place] phone numbers After 10:00 PM, call [place] [phone]

## 2019-06-09 NOTE — ED Triage Notes (Signed)
Pt C/O generalized fatigue and left sided muscle pain that began yesterday morning.

## 2019-06-09 NOTE — ED Notes (Signed)
ED TO INPATIENT HANDOFF REPORT  ED Nurse Name and Phone #: Jettson Crable [phone]  S Name/Age/Gender [name] [name] 73 y.o. male Room/Bed: APA12/APA12  Code Status   Code Status: Prior  Home/SNF/Other Home Patient oriented to: self, place, time and situation Is this baseline? Yes   Triage Complete: Triage complete  Chief Complaint Weakness  Triage Note Pt C/O generalized fatigue and left sided muscle pain that began yesterday morning.   Allergies No Known Allergies  Level of Care/Admitting Diagnosis ED Disposition    ED Disposition Condition Comment   Admit  Hospital Area: [facility] [[id number]]  Level of Care: Stepdown [14]  Covid Evaluation: Asymptomatic Screening Protocol (No Symptoms)  Diagnosis: Metabolic acidosis [221728]  Admitting Physician: Erick Blinks [[id number]]  Attending Physician: Erick Blinks [[id number]]  PT Class (Do Not Modify): Observation [104]  PT Acc Code (Do Not Modify): Observation [10022]       B Medical/Surgery History Past Medical History:  Diagnosis Date  . Bleeding acute gastric ulcer 08/2017  . Coronary artery disease    a. 10/2007 s/p PCI of LAD (3.5x23 Vision BMS); b. 05/2008 Cath/PCI: LM nl, LAD 54m, 80 ISR (3.5x28 Promus DES), LCX min irregs, RI nl;  c. 12/2009 MV: EF 61%, no ischemia.  . Diabetes mellitus   . Dyslipidemia   . Hypertension   . Hypertrophic cardiomyopathy (HCC)    a. 03/2014 Echo: EF 60-65%, Gr 1 DD, sev septal basal hypertrophy, no SAM but near mid-cavitary obstruction, mild MR.   Past Surgical History:  Procedure Laterality Date  . BACK SURGERY  1992  . CORONARY STENT PLACEMENT  06/03/2008   Promus stent to mid LAD; 10/23/2007 Vision stent to mid LAD  . ESOPHAGOGASTRODUODENOSCOPY N/A 09/06/2017   Procedure: ESOPHAGOGASTRODUODENOSCOPY (EGD);  Surgeon: Iva Boop, MD;  Location: [facility] ENDOSCOPY;  Service: Endoscopy;  Laterality: N/A;  . FEMORAL ENDARTERECTOMY  2010   right, with bovine patch angioplasty;  post  cath, r./t aneursym/claudication  . INGUINAL HERNIA REPAIR Left 08/06/2013   Procedure: HERNIA REPAIR INGUINAL ADULT;  Surgeon: [name], MD;  Location: [facility];  Service: General;  Laterality: Left;  . TRANSTHORACIC ECHOCARDIOGRAM  01/12/2010   EF>55%; mild concentric LVH; mild MR, mild TR     A IV Location/Drains/Wounds Patient Lines/Drains/Airways Status   Active Line/Drains/Airways    Name:   Placement date:   Placement time:   Site:   Days:   Peripheral IV 07/01/19 Left Antecubital   07/01/2019    0230    Antecubital   less than 1   Incision 08/06/13 Abdomen Left   08/06/13    8295     2133          Intake/Output Last 24 hours No intake or output data in the 24 hours ending 01-Jul-2019 1740  Labs/Imaging Results for orders placed or performed during the hospital encounter of 2019/07/01 (from the past 48 hour(s))  Urinalysis, Routine w reflex microscopic     Status: Abnormal   Collection Time: Jul 01, 2019  2:21 AM  Result Value Ref Range   Color, Urine YELLOW YELLOW   APPearance CLOUDY (A) CLEAR   Specific Gravity, Urine 1.020 1.005 - 1.030   pH 5.0 5.0 - 8.0   Glucose, UA >=500 (A) NEGATIVE mg/dL   Hgb urine dipstick MODERATE (A) NEGATIVE   Bilirubin Urine NEGATIVE NEGATIVE   Ketones, ur 20 (A) NEGATIVE mg/dL   Protein, ur 30 (A) NEGATIVE mg/dL   Nitrite NEGATIVE NEGATIVE   Leukocytes,Ua NEGATIVE NEGATIVE  RBC / HPF 0-5 0 - 5 RBC/hpf   WBC, UA 0-5 0 - 5 WBC/hpf   Bacteria, UA NONE SEEN NONE SEEN   Squamous Epithelial / LPF 0-5 0 - 5   Mucus PRESENT    Budding Yeast PRESENT    Uric Acid Crys, UA PRESENT     Comment: Performed at [facility], [address], [place], [place] [address]  Comprehensive metabolic panel     Status: Abnormal   Collection Time: 06/12/2019  3:37 AM  Result Value Ref Range   Sodium 137 135 - 145 mmol/L   Potassium 3.5 3.5 - 5.1 mmol/L   Chloride 103 98 - 111 mmol/L   CO2 16 (L) 22 - 32 mmol/L   Glucose, Bld 242 (H) 70 - 99 mg/dL   BUN 22 8  - 23 mg/dL   Creatinine, Ser 1.60 (H) 0.61 - 1.24 mg/dL   Calcium 8.9 8.9 - 10.3 mg/dL   Total Protein 6.9 6.5 - 8.1 g/dL   Albumin 3.4 (L) 3.5 - 5.0 g/dL   AST 27 15 - 41 U/L   ALT 30 0 - 44 U/L   Alkaline Phosphatase 66 38 - 126 U/L   Total Bilirubin 1.5 (H) 0.3 - 1.2 mg/dL   GFR calc non Af Amer 42 (L) >60 mL/min   GFR calc Af Amer 48 (L) >60 mL/min   Anion gap 18 (H) 5 - 15    Comment: Performed at [facility], [address] [place], [place] [address]  CBC with Differential     Status: Abnormal   Collection Time: 06/08/2019  3:37 AM  Result Value Ref Range   WBC 4.1 4.0 - 10.5 K/uL   RBC 4.04 (L) 4.22 - 5.81 MIL/uL   Hemoglobin 11.7 (L) 13.0 - 17.0 g/dL   HCT 35.9 (L) 39.0 - 52.0 %   MCV 88.9 80.0 - 100.0 fL   MCH 29.0 26.0 - 34.0 pg   MCHC 32.6 30.0 - 36.0 g/dL   RDW 12.6 11.5 - 15.5 %   Platelets 211 150 - 400 K/uL   nRBC 0.0 0.0 - 0.2 %   Neutrophils Relative % 84 %   Neutro Abs 3.5 1.7 - 7.7 K/uL   Lymphocytes Relative 9 %   Lymphs Abs 0.4 (L) 0.7 - 4.0 K/uL   Monocytes Relative 6 %   Monocytes Absolute 0.3 0.1 - 1.0 K/uL   Eosinophils Relative 0 %   Eosinophils Absolute 0.0 0.0 - 0.5 K/uL   Basophils Relative 1 %   Basophils Absolute 0.0 0.0 - 0.1 K/uL   Immature Granulocytes 0 %   Abs Immature Granulocytes 0.00 0.00 - 0.07 K/uL    Comment: Performed at [facility], [address], [place], [place] [address]  Lipase, blood     Status: None   Collection Time: 06/08/2019  3:37 AM  Result Value Ref Range   Lipase 34 11 - 51 U/L    Comment: Performed at [facility], [address] [place], [place] [address]  Troponin I (High Sensitivity)     Status: None   Collection Time: 05/27/2019  3:37 AM  Result Value Ref Range   Troponin I (High Sensitivity) 11.3 <18 ng/L    Comment: (NOTE) Elevated high sensitivity troponin I (hsTnI) values and significant  changes across serial measurements may suggest ACS but many other  chronic and acute conditions are known to elevate  hsTnI results.  Refer to the Links section for chest pain algorithms and  additional  guidance. Performed at [facility], [address], [place], [place] [address]   Lactic acid, plasma     Status: None   Collection Time: 06-14-2019  3:37 AM  Result Value Ref Range   Lactic Acid, Venous 1.4 0.5 - 1.9 mmol/L    Comment: Performed at [facility], [address], [place], [place] [address]  Culture, blood (routine x 2)     Status: None (Preliminary result)   Collection Time: 2019-06-14  3:39 AM   Specimen: BLOOD LEFT HAND  Result Value Ref Range   Specimen Description BLOOD LEFT HAND    Special Requests      BOTTLES DRAWN AEROBIC AND ANAEROBIC Blood Culture results may not be optimal due to an inadequate volume of blood received in culture bottles   Culture      NO GROWTH < 12 HOURS Performed at [facility], [address] [place], [place] [address]    Report Status PENDING   Culture, blood (routine x 2)     Status: None (Preliminary result)   Collection Time: Jun 14, 2019  3:39 AM   Specimen: BLOOD  Result Value Ref Range   Specimen Description BLOOD RIGHT ANTECUBITAL    Special Requests      BOTTLES DRAWN AEROBIC AND ANAEROBIC Blood Culture adequate volume   Culture      NO GROWTH < 12 HOURS Performed at [facility], [address] [place], [place] [address]    Report Status PENDING   Lactic acid, plasma     Status: None   Collection Time: June 14, 2019  5:35 AM  Result Value Ref Range   Lactic Acid, Venous 1.9 0.5 - 1.9 mmol/L    Comment: Performed at [facility], [address], [place], [place] [address]  Troponin I (High Sensitivity)     Status: None   Collection Time: 06-14-19  5:35 AM  Result Value Ref Range   Troponin I (High Sensitivity) 12 <18 ng/L    Comment: (NOTE) Elevated high sensitivity troponin I (hsTnI) values and significant  changes across serial measurements may suggest ACS but many other  chronic and acute conditions are known to elevate hsTnI results.  Refer  to the "Links" section for chest pain algorithms and additional  guidance. Performed at [facility], [address] [place], [place] [address]   CK     Status: None   Collection Time: 06-14-19  5:35 AM  Result Value Ref Range   Total CK 89 49 - 397 U/L    Comment: Performed at [facility], [address], [place], [place] [address]  SARS CORONAVIRUS 2 (TAT 6-24 HRS) Nasopharyngeal Nasopharyngeal Swab     Status: None   Collection Time: 2019-06-14  6:48 AM   Specimen: Nasopharyngeal Swab  Result Value Ref Range   SARS Coronavirus 2 NEGATIVE NEGATIVE    Comment: (NOTE) SARS-CoV-2 target nucleic acids are NOT DETECTED. The SARS-CoV-2 RNA is generally detectable in upper and lower respiratory specimens during the acute phase of infection. Negative results do not preclude SARS-CoV-2 infection, do not rule out co-infections with other pathogens, and should not be used as the sole basis for treatment or other patient management decisions. Negative results must be combined with clinical observations, patient history, and epidemiological information. The expected result is Negative. Fact Sheet for Patients: HairSlick.no Fact Sheet for Healthcare Providers: quierodirigir.com This test is not yet approved or cleared by the [place] FDA and  has been authorized for detection and/or diagnosis of SARS-CoV-2 by  FDA under an Emergency Use Authorization (EUA). This EUA will remain  in effect (meaning this test can be used) for the duration of the COVID-19 declaration under Section 56 4(b)(1) of the Act, 21 U.S.C. section 360bbb-3(b)(1), unless the authorization is terminated or revoked sooner. Performed at [facility], [address] [place], [place] [address]   Basic metabolic panel     Status: Abnormal   Collection Time: 2019-03-01  8:37 AM  Result Value Ref Range   Sodium 140 135 - 145 mmol/L   Potassium 4.7 3.5 - 5.1 mmol/L     Comment: DELTA CHECK NOTED   Chloride 110 98 - 111 mmol/L   CO2 15 (L) 22 - 32 mmol/L   Glucose, Bld 197 (H) 70 - 99 mg/dL   BUN 25 (H) 8 - 23 mg/dL   Creatinine, Ser 1.911.74 (H) 0.61 - 1.24 mg/dL   Calcium 8.3 (L) 8.9 - 10.3 mg/dL   GFR calc non Af Amer 38 (L) >60 mL/min   GFR calc Af Amer 44 (L) >60 mL/min   Anion gap 15 5 - 15    Comment: Performed at [facility], [address] [place], [place] [address]  Blood gas, arterial     Status: Abnormal   Collection Time: 2019-03-01  1:11 PM  Result Value Ref Range   FIO2 21.00    pH, Arterial 7.364 7.350 - 7.450   pCO2 arterial 22.1 (L) 32.0 - 48.0 mmHg   pO2, Arterial 78.2 (L) 83.0 - 108.0 mmHg   Bicarbonate 15.6 (L) 20.0 - 28.0 mmol/L   Acid-base deficit 12.1 (H) 0.0 - 2.0 mmol/L   O2 Saturation 94.6 %   Patient temperature 37.7    Allens test (pass/fail) PASS PASS    Comment: Performed at [facility], [address] [place], [place] [address]  Ethanol     Status: None   Collection Time: 2019-03-01  2:23 PM  Result Value Ref Range   Alcohol, Ethyl (B) <10 <10 mg/dL    Comment: (NOTE) Lowest detectable limit for serum alcohol is 10 mg/dL. For medical purposes only. Performed at [facility], [address] [place], [place] [address]    Ct Chest Wo Contrast  Result Date: 2020-02-919 CLINICAL DATA:  Left-sided chest pain. Shortness of breath. EXAM: CT CHEST WITHOUT CONTRAST TECHNIQUE: Multidetector CT imaging of the chest was performed following the standard protocol without IV contrast. COMPARISON:  Chest x-ray dated 2020-02-919 and CT scan of the abdomen dated 09/05/2017 FINDINGS: Cardiovascular: Aortic atherosclerosis. Coronary artery calcifications. Heart size is normal. No pericardial effusion. Mediastinum/Nodes: No enlarged mediastinal or axillary lymph nodes. Thyroid gland, trachea, and esophagus demonstrate no significant findings. Lungs/Pleura: There are no infiltrates. There is a small left pleural effusion. Tiny right  effusion. Minimal secondary atelectasis at the lung bases. The lungs are otherwise clear. Upper Abdomen: There is chronic bilateral adrenal enlargement. Tiny amount of ascites in the left upper quadrant. Musculoskeletal: No chest wall mass or suspicious bone lesions identified. IMPRESSION: 1. Small left and tiny right pleural effusions with secondary atelectasis at the lung bases. 2. Chronic bilateral adrenal enlargement. 3. Tiny amount of ascites in the left upper quadrant. 4. Aortic Atherosclerosis (ICD10-I70.0). Electronically Signed   By: Francene BoyersJames  Maxwell M.D.   On: 2020-02-919 12:14   Nm Pulmonary Perfusion  Result Date: 2020-02-919 CLINICAL DATA:  Chest pain and shortness of breath EXAM: NUCLEAR MEDICINE PERFUSION LUNG SCAN TECHNIQUE: Perfusion images were obtained in multiple projections after intravenous injection of radiopharmaceutical.  Ventilation scans intentionally deferred if perfusion scan and chest x-ray adequate for interpretation during COVID 19 epidemic. Views: Anterior, posterior, left lateral, right lateral, RPO, LPO, RAO, LAO RADIOPHARMACEUTICALS:  1.5 mCi Tc-78m MAA IV COMPARISON:  Chest radiograph and chest CT 07-04-19 FINDINGS: Radiotracer uptake is homogeneous and symmetric bilaterally. No perfusion defects evident. IMPRESSION: No appreciable perfusion defects. Very low probability of pulmonary embolus. Electronically Signed   By: Bretta Bang III M.D.   On: 07/04/2019 15:21   Dg Chest Port 1 View  Result Date: 2019-07-04 CLINICAL DATA:  73 year old male with left side chest pain, fatigue. EXAM: PORTABLE CHEST 1 VIEW COMPARISON:  Chest radiographs 05/21/2018 and earlier. FINDINGS: Portable AP upright view at 0243 hours. Stable lung volumes. Mediastinal contours remain normal. Visualized tracheal air column is within normal limits. Allowing for portable technique the lungs are clear. No pneumothorax. Negative visible bowel gas pattern. No acute osseous abnormality  identified. IMPRESSION: Negative portable chest. Electronically Signed   By: Odessa Fleming M.D.   On: 07-04-19 02:56    Pending Labs Unresulted Labs (From admission, onward)    Start     Ordered   07/04/2019 1414  Salicylate level  Add-on,   AD     07-04-2019 1414          Vitals/Pain Today's Vitals   2019-07-04 1615 07/04/19 1630 2019/07/04 1700 07-04-19 1730  BP:  (!) 156/74 (!) 160/72 (!) 169/72  Pulse: (!) 123 (!) 125    Resp:      Temp:      TempSrc:      SpO2: 100% 97%    PainSc:        Isolation Precautions No active isolations  Medications Medications  sodium bicarbonate 150 mEq in dextrose 5% 1000 mL infusion (150 mEq Intravenous New Bag/Given 07/04/19 1507)  sodium chloride 0.9 % bolus 1,000 mL (0 mLs Intravenous Stopped 07-04-2019 1019)  sodium chloride 0.9 % bolus 1,000 mL (0 mLs Intravenous Stopped 04-Jul-2019 0754)  methocarbamol (ROBAXIN) tablet 500 mg (500 mg Oral Given 07-04-2019 0753)  technetium albumin aggregated (MAA) injection solution 1.5 millicurie (1.5 millicuries Intravenous Contrast Given 04-Jul-2019 1435)    Mobility walks Low fall risk   Focused Assessments    R Recommendations: See Admitting Provider Note  Report given to:   Additional Notes:

## 2019-06-09 NOTE — Progress Notes (Signed)
Brandon Melnick, Pa paged to make aware of pts HR, sustaining in 130-140s. Waiting for call back/orders.

## 2019-06-10 ENCOUNTER — Inpatient Hospital Stay (HOSPITAL_COMMUNITY): Payer: Medicare Other

## 2019-06-10 DIAGNOSIS — Z79899 Other long term (current) drug therapy: Secondary | ICD-10-CM | POA: Diagnosis not present

## 2019-06-10 DIAGNOSIS — Z66 Do not resuscitate: Secondary | ICD-10-CM | POA: Diagnosis not present

## 2019-06-10 DIAGNOSIS — I422 Other hypertrophic cardiomyopathy: Secondary | ICD-10-CM | POA: Diagnosis present

## 2019-06-10 DIAGNOSIS — I2510 Atherosclerotic heart disease of native coronary artery without angina pectoris: Secondary | ICD-10-CM | POA: Diagnosis present

## 2019-06-10 DIAGNOSIS — K7200 Acute and subacute hepatic failure without coma: Secondary | ICD-10-CM | POA: Diagnosis present

## 2019-06-10 DIAGNOSIS — E860 Dehydration: Secondary | ICD-10-CM | POA: Diagnosis present

## 2019-06-10 DIAGNOSIS — E1110 Type 2 diabetes mellitus with ketoacidosis without coma: Secondary | ICD-10-CM | POA: Diagnosis present

## 2019-06-10 DIAGNOSIS — E43 Unspecified severe protein-calorie malnutrition: Secondary | ICD-10-CM | POA: Diagnosis present

## 2019-06-10 DIAGNOSIS — E873 Alkalosis: Secondary | ICD-10-CM | POA: Diagnosis present

## 2019-06-10 DIAGNOSIS — E1151 Type 2 diabetes mellitus with diabetic peripheral angiopathy without gangrene: Secondary | ICD-10-CM | POA: Diagnosis present

## 2019-06-10 DIAGNOSIS — Y95 Nosocomial condition: Secondary | ICD-10-CM | POA: Diagnosis present

## 2019-06-10 DIAGNOSIS — R9431 Abnormal electrocardiogram [ECG] [EKG]: Secondary | ICD-10-CM | POA: Diagnosis not present

## 2019-06-10 DIAGNOSIS — A419 Sepsis, unspecified organism: Secondary | ICD-10-CM | POA: Diagnosis present

## 2019-06-10 DIAGNOSIS — R140 Abdominal distension (gaseous): Secondary | ICD-10-CM

## 2019-06-10 DIAGNOSIS — E785 Hyperlipidemia, unspecified: Secondary | ICD-10-CM | POA: Diagnosis present

## 2019-06-10 DIAGNOSIS — Z955 Presence of coronary angioplasty implant and graft: Secondary | ICD-10-CM | POA: Diagnosis not present

## 2019-06-10 DIAGNOSIS — I10 Essential (primary) hypertension: Secondary | ICD-10-CM | POA: Diagnosis present

## 2019-06-10 DIAGNOSIS — Z681 Body mass index (BMI) 19 or less, adult: Secondary | ICD-10-CM | POA: Diagnosis not present

## 2019-06-10 DIAGNOSIS — R6520 Severe sepsis without septic shock: Secondary | ICD-10-CM | POA: Diagnosis not present

## 2019-06-10 DIAGNOSIS — R0682 Tachypnea, not elsewhere classified: Secondary | ICD-10-CM | POA: Diagnosis present

## 2019-06-10 DIAGNOSIS — J9601 Acute respiratory failure with hypoxia: Secondary | ICD-10-CM | POA: Diagnosis present

## 2019-06-10 DIAGNOSIS — Z7982 Long term (current) use of aspirin: Secondary | ICD-10-CM | POA: Diagnosis not present

## 2019-06-10 DIAGNOSIS — J90 Pleural effusion, not elsewhere classified: Secondary | ICD-10-CM | POA: Diagnosis present

## 2019-06-10 DIAGNOSIS — R6521 Severe sepsis with septic shock: Secondary | ICD-10-CM | POA: Diagnosis present

## 2019-06-10 DIAGNOSIS — Z515 Encounter for palliative care: Secondary | ICD-10-CM | POA: Diagnosis not present

## 2019-06-10 DIAGNOSIS — N179 Acute kidney failure, unspecified: Secondary | ICD-10-CM

## 2019-06-10 DIAGNOSIS — J189 Pneumonia, unspecified organism: Secondary | ICD-10-CM | POA: Diagnosis present

## 2019-06-10 DIAGNOSIS — Z20828 Contact with and (suspected) exposure to other viral communicable diseases: Secondary | ICD-10-CM | POA: Diagnosis present

## 2019-06-10 DIAGNOSIS — K56609 Unspecified intestinal obstruction, unspecified as to partial versus complete obstruction: Secondary | ICD-10-CM | POA: Diagnosis not present

## 2019-06-10 DIAGNOSIS — K56600 Partial intestinal obstruction, unspecified as to cause: Secondary | ICD-10-CM | POA: Diagnosis present

## 2019-06-10 LAB — BASIC METABOLIC PANEL
Anion gap: 11 (ref 5–15)
Anion gap: 11 (ref 5–15)
Anion gap: 15 (ref 5–15)
Anion gap: 16 — ABNORMAL HIGH (ref 5–15)
Anion gap: 14 (ref 5–15)
BUN: 30 mg/dL — ABNORMAL HIGH (ref 8–23)
BUN: 32 mg/dL — ABNORMAL HIGH (ref 8–23)
BUN: 35 mg/dL — ABNORMAL HIGH (ref 8–23)
BUN: 38 mg/dL — ABNORMAL HIGH (ref 8–23)
BUN: 48 mg/dL — ABNORMAL HIGH (ref 8–23)
CO2: 19 mmol/L — ABNORMAL LOW (ref 22–32)
CO2: 20 mmol/L — ABNORMAL LOW (ref 22–32)
CO2: 19 mmol/L — ABNORMAL LOW (ref 22–32)
CO2: 18 mmol/L — ABNORMAL LOW (ref 22–32)
CO2: 13 mmol/L — ABNORMAL LOW (ref 22–32)
Calcium: 8.2 mg/dL — ABNORMAL LOW (ref 8.9–10.3)
Calcium: 8.4 mg/dL — ABNORMAL LOW (ref 8.9–10.3)
Calcium: 8.6 mg/dL — ABNORMAL LOW (ref 8.9–10.3)
Calcium: 8.4 mg/dL — ABNORMAL LOW (ref 8.9–10.3)
Calcium: 8 mg/dL — ABNORMAL LOW (ref 8.9–10.3)
Chloride: 107 mmol/L (ref 98–111)
Chloride: 109 mmol/L (ref 98–111)
Chloride: 106 mmol/L (ref 98–111)
Chloride: 105 mmol/L (ref 98–111)
Chloride: 107 mmol/L (ref 98–111)
Creatinine, Ser: 1.94 mg/dL — ABNORMAL HIGH (ref 0.61–1.24)
Creatinine, Ser: 2.03 mg/dL — ABNORMAL HIGH (ref 0.61–1.24)
Creatinine, Ser: 2.34 mg/dL — ABNORMAL HIGH (ref 0.61–1.24)
Creatinine, Ser: 2.66 mg/dL — ABNORMAL HIGH (ref 0.61–1.24)
Creatinine, Ser: 3.27 mg/dL — ABNORMAL HIGH (ref 0.61–1.24)
GFR calc Af Amer: 38 mL/min — ABNORMAL LOW (ref >60)
GFR calc Af Amer: 36 mL/min — ABNORMAL LOW (ref >60)
GFR calc Af Amer: 31 mL/min — ABNORMAL LOW (ref >60)
GFR calc Af Amer: 26 mL/min — ABNORMAL LOW (ref >60)
GFR calc Af Amer: 20 mL/min — ABNORMAL LOW (ref >60)
GFR calc non Af Amer: 33 mL/min — ABNORMAL LOW (ref >60)
GFR calc non Af Amer: 31 mL/min — ABNORMAL LOW (ref >60)
GFR calc non Af Amer: 26 mL/min — ABNORMAL LOW (ref >60)
GFR calc non Af Amer: 23 mL/min — ABNORMAL LOW (ref >60)
GFR calc non Af Amer: 18 mL/min — ABNORMAL LOW (ref >60)
Glucose, Bld: 200 mg/dL — ABNORMAL HIGH (ref 70–99)
Glucose, Bld: 159 mg/dL — ABNORMAL HIGH (ref 70–99)
Glucose, Bld: 83 mg/dL (ref 70–99)
Glucose, Bld: 133 mg/dL — ABNORMAL HIGH (ref 70–99)
Glucose, Bld: 313 mg/dL — ABNORMAL HIGH (ref 70–99)
Potassium: 3.1 mmol/L — ABNORMAL LOW (ref 3.5–5.1)
Potassium: 3.2 mmol/L — ABNORMAL LOW (ref 3.5–5.1)
Potassium: 4.4 mmol/L (ref 3.5–5.1)
Potassium: 4.2 mmol/L (ref 3.5–5.1)
Potassium: 4.6 mmol/L (ref 3.5–5.1)
Sodium: 137 mmol/L (ref 135–145)
Sodium: 140 mmol/L (ref 135–145)
Sodium: 140 mmol/L (ref 135–145)
Sodium: 139 mmol/L (ref 135–145)
Sodium: 134 mmol/L — ABNORMAL LOW (ref 135–145)

## 2019-06-10 LAB — GLUCOSE, CAPILLARY
Glucose-Capillary: 105 mg/dL — ABNORMAL HIGH (ref 70–99)
Glucose-Capillary: 112 mg/dL — ABNORMAL HIGH (ref 70–99)
Glucose-Capillary: 134 mg/dL — ABNORMAL HIGH (ref 70–99)
Glucose-Capillary: 135 mg/dL — ABNORMAL HIGH (ref 70–99)
Glucose-Capillary: 161 mg/dL — ABNORMAL HIGH (ref 70–99)
Glucose-Capillary: 166 mg/dL — ABNORMAL HIGH (ref 70–99)
Glucose-Capillary: 175 mg/dL — ABNORMAL HIGH (ref 70–99)
Glucose-Capillary: 183 mg/dL — ABNORMAL HIGH (ref 70–99)
Glucose-Capillary: 210 mg/dL — ABNORMAL HIGH (ref 70–99)
Glucose-Capillary: 237 mg/dL — ABNORMAL HIGH (ref 70–99)
Glucose-Capillary: 276 mg/dL — ABNORMAL HIGH (ref 70–99)
Glucose-Capillary: 294 mg/dL — ABNORMAL HIGH (ref 70–99)

## 2019-06-10 LAB — HEMOGLOBIN A1C
Hgb A1c MFr Bld: 9.1 % — ABNORMAL HIGH (ref 4.8–5.6)
Mean Plasma Glucose: 214.47 mg/dL

## 2019-06-10 LAB — CBC
HCT: 43.3 % (ref 39.0–52.0)
Hemoglobin: 14 g/dL (ref 13.0–17.0)
MCH: 28.5 pg (ref 26.0–34.0)
MCHC: 32.3 g/dL (ref 30.0–36.0)
MCV: 88 fL (ref 80.0–100.0)
Platelets: 256 10*3/uL (ref 150–400)
RBC: 4.92 MIL/uL (ref 4.22–5.81)
RDW: 13.2 % (ref 11.5–15.5)
WBC: 7.3 10*3/uL (ref 4.0–10.5)
nRBC: 0.6 % — ABNORMAL HIGH (ref 0.0–0.2)

## 2019-06-10 LAB — CREATININE, URINE, RANDOM: Creatinine, Urine: 62.69 mg/dL

## 2019-06-10 LAB — BLOOD GAS, ARTERIAL
Acid-base deficit: 9.6 mmol/L — ABNORMAL HIGH (ref 0.0–2.0)
Bicarbonate: 17.9 mmol/L — ABNORMAL LOW (ref 20.0–28.0)
FIO2: 30
O2 Saturation: 91.2 %
Patient temperature: 37
pCO2 arterial: 20.1 mmHg — ABNORMAL LOW (ref 32.0–48.0)
pH, Arterial: 7.45 (ref 7.350–7.450)
pO2, Arterial: 60.4 mmHg — ABNORMAL LOW (ref 83.0–108.0)

## 2019-06-10 LAB — TROPONIN I (HIGH SENSITIVITY): Troponin I (High Sensitivity): 41 ng/L — ABNORMAL HIGH (ref <18)

## 2019-06-10 LAB — MAGNESIUM: Magnesium: 2.1 mg/dL (ref 1.7–2.4)

## 2019-06-10 LAB — MRSA PCR SCREENING: MRSA by PCR: NEGATIVE

## 2019-06-10 LAB — BETA-HYDROXYBUTYRIC ACID
Beta-Hydroxybutyric Acid: 0.18 mmol/L (ref 0.05–0.27)
Beta-Hydroxybutyric Acid: 0.11 mmol/L (ref 0.05–0.27)

## 2019-06-10 LAB — SODIUM, URINE, RANDOM: Sodium, Ur: 31 mmol/L

## 2019-06-10 LAB — SALICYLATE LEVEL: Salicylate Lvl: <7 mg/dL (ref 2.8–30.0)

## 2019-06-10 LAB — TSH: TSH: 0.804 u[IU]/mL (ref 0.350–4.500)

## 2019-06-10 LAB — T4, FREE: Free T4: 1.03 ng/dL (ref 0.61–1.12)

## 2019-06-10 MED ORDER — ORAL CARE MOUTH RINSE
15.0000 mL | Freq: Two times a day (BID) | OROMUCOSAL | Status: DC
  Administered 2019-06-10 (×3): 15 mL via OROMUCOSAL

## 2019-06-10 MED ORDER — SUCCINYLCHOLINE CHLORIDE 20 MG/ML IJ SOLN: 100.0000 mg | Freq: Once | INTRAMUSCULAR | Status: DC

## 2019-06-10 MED ORDER — FUROSEMIDE 10 MG/ML IJ SOLN
40.0000 mg | Freq: Once | INTRAMUSCULAR | Status: AC
  Administered 2019-06-10: 22:00:00 40 mg via INTRAVENOUS

## 2019-06-10 MED ORDER — ETOMIDATE 2 MG/ML IV SOLN
20.0000 mg | Freq: Once | INTRAVENOUS | Status: AC
  Administered 2019-06-11: 20 mg via INTRAVENOUS

## 2019-06-10 MED ORDER — DEXMEDETOMIDINE HCL IN NACL 400 MCG/100ML IV SOLN
0.4000 ug/kg/h | INTRAVENOUS | Status: DC
  Administered 2019-06-10: 0.4 ug/kg/h via INTRAVENOUS
  Filled 2019-06-10: qty 100

## 2019-06-10 MED ORDER — METOPROLOL TARTRATE 5 MG/5ML IV SOLN
5.0000 mg | Freq: Once | INTRAVENOUS | Status: AC
  Administered 2019-06-10: 5 mg via INTRAVENOUS
  Filled 2019-06-10: qty 5

## 2019-06-10 MED ORDER — METOPROLOL TARTRATE 25 MG PO TABS: 25.0000 mg | ORAL_TABLET | Freq: Two times a day (BID) | ORAL | Status: DC

## 2019-06-10 MED ORDER — ORAL CARE MOUTH RINSE: 15.0000 mL | OROMUCOSAL | Status: DC

## 2019-06-10 MED ORDER — LACTATED RINGERS IV BOLUS
1000.0000 mL | Freq: Once | INTRAVENOUS | Status: AC
  Administered 2019-06-10: 1000 mL via INTRAVENOUS

## 2019-06-10 MED ORDER — CHLORHEXIDINE GLUCONATE 0.12% ORAL RINSE (MEDLINE KIT)
15.0000 mL | Freq: Two times a day (BID) | OROMUCOSAL | Status: DC
  Administered 2019-06-11 – 2019-06-13 (×6): 15 mL via OROMUCOSAL

## 2019-06-10 MED ORDER — ADULT MULTIVITAMIN W/MINERALS CH
1.0000 | ORAL_TABLET | Freq: Every day | ORAL | Status: DC
  Administered 2019-06-10: 1 via ORAL
  Filled 2019-06-10 (×2): qty 1

## 2019-06-10 MED ORDER — INSULIN GLARGINE 100 UNIT/ML ~~LOC~~ SOLN
10.0000 [IU] | Freq: Every day | SUBCUTANEOUS | Status: DC
  Administered 2019-06-10: 10 [IU] via SUBCUTANEOUS
  Filled 2019-06-10 (×2): qty 0.1

## 2019-06-10 MED ORDER — INSULIN ASPART 100 UNIT/ML ~~LOC~~ SOLN: 0.0000 [IU] | Freq: Three times a day (TID) | SUBCUTANEOUS | Status: DC

## 2019-06-10 MED ORDER — DILTIAZEM LOAD VIA INFUSION
10.0000 mg | Freq: Once | INTRAVENOUS | Status: DC
  Filled 2019-06-10: qty 10

## 2019-06-10 MED ORDER — SODIUM CHLORIDE 0.9 % IV SOLN
INTRAVENOUS | Status: DC
  Administered 2019-06-10 – 2019-06-11 (×2): via INTRAVENOUS

## 2019-06-10 MED ORDER — DILTIAZEM HCL 25 MG/5ML IV SOLN
10.0000 mg | Freq: Once | INTRAVENOUS | Status: AC
  Administered 2019-06-10: 10 mg via INTRAVENOUS
  Filled 2019-06-10: qty 5

## 2019-06-10 MED ORDER — LABETALOL HCL 5 MG/ML IV SOLN
10.0000 mg | INTRAVENOUS | Status: DC | PRN
  Administered 2019-06-11: 10 mg via INTRAVENOUS
  Filled 2019-06-10: qty 4

## 2019-06-10 MED ORDER — POTASSIUM CHLORIDE CRYS ER 20 MEQ PO TBCR
40.0000 meq | EXTENDED_RELEASE_TABLET | Freq: Once | ORAL | Status: AC
  Administered 2019-06-10: 40 meq via ORAL
  Filled 2019-06-10: qty 2

## 2019-06-10 MED ORDER — METOPROLOL TARTRATE 25 MG PO TABS
12.5000 mg | ORAL_TABLET | Freq: Two times a day (BID) | ORAL | Status: DC
  Administered 2019-06-10: 12.5 mg via ORAL
  Filled 2019-06-10: qty 1

## 2019-06-10 MED ORDER — FUROSEMIDE 10 MG/ML IJ SOLN
INTRAMUSCULAR | Status: AC
  Administered 2019-06-10: 40 mg via INTRAVENOUS
  Filled 2019-06-10: qty 4

## 2019-06-10 MED ORDER — INSULIN ASPART 100 UNIT/ML ~~LOC~~ SOLN
0.0000 [IU] | Freq: Every day | SUBCUTANEOUS | Status: DC
  Administered 2019-06-11: 3 [IU] via SUBCUTANEOUS

## 2019-06-10 MED ORDER — POTASSIUM CHLORIDE 10 MEQ/100ML IV SOLN
10.0000 meq | INTRAVENOUS | Status: AC
  Administered 2019-06-10 (×2): 10 meq via INTRAVENOUS
  Filled 2019-06-10 (×2): qty 100

## 2019-06-10 MED ORDER — INSULIN ASPART 100 UNIT/ML ~~LOC~~ SOLN: 3.0000 [IU] | Freq: Three times a day (TID) | SUBCUTANEOUS | Status: DC

## 2019-06-10 MED ORDER — DILTIAZEM HCL-DEXTROSE 125-5 MG/125ML-% IV SOLN (PREMIX)
5.0000 mg/h | INTRAVENOUS | Status: DC
  Administered 2019-06-10: 5 mg/h via INTRAVENOUS
  Filled 2019-06-10: qty 125

## 2019-06-10 MED ORDER — PRO-STAT SUGAR FREE PO LIQD
30.0000 mL | Freq: Two times a day (BID) | ORAL | Status: DC
  Administered 2019-06-10 – 2019-06-12 (×3): 30 mL via ORAL
  Filled 2019-06-10 (×3): qty 30

## 2019-06-10 MED ORDER — GLUCERNA SHAKE PO LIQD
237.0000 mL | Freq: Three times a day (TID) | ORAL | Status: DC
  Administered 2019-06-10 – 2019-06-12 (×3): 237 mL via ORAL

## 2019-06-10 NOTE — Progress Notes (Addendum)
Xcover Tachypeneic per RN  Exam T 98.6 P 138  R 45 Bp 150/71  Pox 100% on 2L [place]  Heent: anicteric,  Neck: no jvd Heart: tachycardic, s1, s2,  Lung , decrease in bs at the left lung base, slight decrease in bs at the right lung base,  Abd: soft, nt, slight distension, +bs Ext: no c/c/e  A/P Tachypnea (prior VQ low prob), Acute respiratory faliure w hypoxia Check cxr check cbc , bmp , trop  Check abg stat EICU -> consulted for evaluation of whether needs intubation  Abd distension Check Xray Abd r/o SBO/ ileus, perforation  ARF Check renal ultrasound Foley  Addendum: EICU prefers CT scan abd/ pelvis, will DC Xray abdomen/ renal ultrasound  Critical care time 30 minutes

## 2019-06-10 NOTE — Progress Notes (Signed)
PROGRESS NOTE    [name] [name]  ZOX:[id number] DOB: [birth year] DOA: 06/13/2019 PCP: Jerlyn LyHollar, Larry A, MD   Brief Narrative:  Per HPI: [name] [name] is a 73 y.o. male with medical history significant of diabetes, hypertension, hypertrophic cardiomyopathy, presents to the emergency room with increasing shortness of breath, generalized weakness, left shoulder pain that radiates into his left arm, left neck and down his left torso.  This discomfort is worse when he tries to get up and move/lift something with his arm.  He has not had any cough.  His wife does report him having a fever of 101F earlier today.  He describes some lower abdominal pain for the past few days.  He has had 2-3 bowel movements today, but they were not reported to be loose.  He has had no nausea or vomiting.  He does occasionally have dysuria.  11/18: Patient will be transitioned off IV insulin drip today as DKA has resolved.  Unfortunately, he continues to have elevated heart rates.  We will increase metoprolol oral dose which has been started earlier this morning.  Will start Cardizem drip after bolus and obtain further evaluation with toxicology as well as TSH.  Considering cardiology evaluation by a.m. if he remains with elevated heart rates and/or unstable.  Assessment & Plan:   Active Problems:   Hyperlipidemia   Hypertrophic cardiomyopathy (HCC)   Hypertension   Type II diabetes mellitus (HCC)   Metabolic acidosis   DKA (diabetic ketoacidoses) (HCC)   AKI (acute kidney injury) (HCC)   1. Metabolic acidosis, secondary to diabetic ketoacidosis.  Patient had elevated anion gap upon arrival to the emergency room.  He had low bicarb and now has elevated blood sugar.  Beta hydroxybutyric acid is also elevated. He is taking empagliflozin which can predispose to DKA.  DKA currently has resolved and patient has been transitioned to subcutaneous insulin regimen.  A1c noted to be 9.1%. 2. SIRS. Likely related to DKA.  No  signs of sepsis noted.  Will discontinue further IV antibiotics at this time.  Blood cultures with no growth. 3. Abdominal pain. Unclear etiology. LFTs and lipase unremarkable. Will check CT abdomen with signs of partial small bowel obstruction noted, but no other acute abnormalities. 4. Respiratory distress/respiratory alkalosis. Secondary to metabolic acidosis. This should improve as patient's acidosis corrects 5. DM2. He is on 3 oral agents, metformin, glipizide, empagliflozin. Will hold for now and start on insulin infusion.  A1c 9.1% 6. HTN with ongoing sinus tachycardia. Hold ace inhibitors due to renal failure. Blood pressure is elevated. Use hydralazine PRN.  Started metoprolol 25 mg twice daily and maintain on Cardizem drip.  Continue close monitoring in stepdown unit.  Patient was noted to have prior bradycardia with metoprolol. 7. Hypertrophic cardiomyopathy.  Recent echocardiogram done by his cardiologist Dr. Rennis GoldenHilty. 8. AKI. Likely related to dehydration. Hold ACE. Continue IV fluids.  We will recheck a.m. labs and check urine electrolytes.  No signs of obstruction noted on CT abdomen.  DVT prophylaxis: Heparin Code Status: Full Family Communication: None at bedside Disposition Plan: Patient has been transitioned off insulin drip and we will monitor closely.  Continue IV fluid and maintain on Cardizem drip for heart rate control.  Consider cardiology evaluation as needed.   Consultants:   None  Procedures:   None  Antimicrobials:  Anti-infectives (From admission, onward)   Start     Dose/Rate Route Frequency Ordered Stop   06/10/19 2000  vancomycin (VANCOCIN) IVPB 750 mg/150 ml premix  Status:  Discontinued     750 mg 150 mL/hr over 60 Minutes Intravenous Every 24 hours 2019/07/08 1826 06/10/19 0743   July 08, 2019 2300  piperacillin-tazobactam (ZOSYN) IVPB 3.375 g  Status:  Discontinued     3.375 g 12.5 mL/hr over 240 Minutes Intravenous Every 8 hours 2019/07/08 1826 06/10/19 0743     2019-07-08 1830  vancomycin (VANCOCIN) IVPB 1000 mg/200 mL premix     1,000 mg 200 mL/hr over 60 Minutes Intravenous  Once 07/08/2019 1818 July 08, 2019 2102   07/08/2019 1830  piperacillin-tazobactam (ZOSYN) IVPB 3.375 g  Status:  Discontinued     3.375 g 12.5 mL/hr over 240 Minutes Intravenous Every 8 hours 08-Jul-2019 1818 06/10/19 0743       Subjective: Patient seen and evaluated today with no new acute complaints or concerns. No acute concerns or events noted overnight.  His heart rate remains elevated.  Blood sugars are better controlled and therefore, patient will be transitioned off insulin drip.  Objective: Vitals:   06/10/19 1130 06/10/19 1300 06/10/19 1315 06/10/19 1400  BP:  (!) 146/85  (!) 143/89  Pulse:  (!) 119 (!) 126 65  Resp:  (!) 55 (!) 32 (!) 54  Temp: 98.2 F (36.8 C)     TempSrc: Axillary     SpO2:  92% 95% 92%  Weight:      Height:        Intake/Output Summary (Last 24 hours) at 06/10/2019 1412 Last data filed at 06/10/2019 0800 Gross per 24 hour  Intake 1637.22 ml  Output 700 ml  Net 937.22 ml   Filed Weights   2019/07/08 1800 2019-07-08 2030 06/10/19 0500  Weight: 61.2 kg 62.7 kg 62.7 kg    Examination:  General exam: Appears calm and comfortable  Respiratory system: Clear to auscultation. Respiratory effort normal. Cardiovascular system: S1 & S2 heard, RRR, tachycardic. No JVD, murmurs, rubs, gallops or clicks. No pedal edema. Gastrointestinal system: Abdomen is nondistended, soft and nontender. No organomegaly or masses felt. Normal bowel sounds heard. Central nervous system: Alert and oriented. No focal neurological deficits. Extremities: Symmetric 5 x 5 power. Skin: No rashes, lesions or ulcers Psychiatry: Judgement and insight appear normal. Mood & affect appropriate.     Data Reviewed: I have personally reviewed following labs and imaging studies  CBC: Recent Labs  Lab Jul 08, 2019 0337  WBC 4.1  NEUTROABS 3.5  HGB 11.7*  HCT 35.9*  MCV 88.9   PLT 144   Basic Metabolic Panel: Recent Labs  Lab 07-08-19 0837 06/10/19 0045 06/10/19 0425 06/10/19 0852 06/10/19 1238  NA 140 137 140 140 139  K 4.7 3.1* 3.2* 4.4 4.2  CL 110 107 109 106 105  CO2 15* 19* 20* 19* 18*  GLUCOSE 197* 200* 159* 83 133*  BUN 25* 30* 32* 35* 38*  CREATININE 1.74* 1.94* 2.03* 2.34* 2.66*  CALCIUM 8.3* 8.2* 8.4* 8.6* 8.4*  MG  --   --  2.1  --   --    GFR: Estimated Creatinine Clearance: 21.6 mL/min (A) (by C-G formula based on SCr of 2.66 mg/dL (H)). Liver Function Tests: Recent Labs  Lab Jul 08, 2019 0337  AST 27  ALT 30  ALKPHOS 66  BILITOT 1.5*  PROT 6.9  ALBUMIN 3.4*   Recent Labs  Lab 08-Jul-2019 0337  LIPASE 34   No results for input(s): AMMONIA in the last 168 hours. Coagulation Profile: No results for input(s): INR, PROTIME in the last 168 hours. Cardiac Enzymes: Recent Labs  Lab 07/08/19 0535  CKTOTAL 89   BNP (last 3 results) No results for input(s): PROBNP in the last 8760 hours. HbA1C: Recent Labs    09-Jul-2019 0337  HGBA1C 9.1*   CBG: Recent Labs  Lab 06/10/19 0426 06/10/19 0530 06/10/19 0630 06/10/19 0828 06/10/19 1135  GLUCAP 134* 135* 166* 105* 112*   Lipid Profile: No results for input(s): CHOL, HDL, LDLCALC, TRIG, CHOLHDL, LDLDIRECT in the last 72 hours. Thyroid Function Tests: No results for input(s): TSH, T4TOTAL, FREET4, T3FREE, THYROIDAB in the last 72 hours. Anemia Panel: No results for input(s): VITAMINB12, FOLATE, FERRITIN, TIBC, IRON, RETICCTPCT in the last 72 hours. Sepsis Labs: Recent Labs  Lab 2019/07/09 6644 07/09/19 0535  LATICACIDVEN 1.4 1.9    Recent Results (from the past 240 hour(s))  Culture, blood (routine x 2)     Status: None (Preliminary result)   Collection Time: July 09, 2019  3:39 AM   Specimen: BLOOD LEFT HAND  Result Value Ref Range Status   Specimen Description BLOOD LEFT HAND  Final   Special Requests   Final    BOTTLES DRAWN AEROBIC AND ANAEROBIC Blood Culture results  may not be optimal due to an inadequate volume of blood received in culture bottles   Culture   Final    NO GROWTH 1 DAY Performed at [facility], [address], [place], [place] [address]    Report Status PENDING  Incomplete  Culture, blood (routine x 2)     Status: None (Preliminary result)   Collection Time: 07-09-19  3:39 AM   Specimen: BLOOD  Result Value Ref Range Status   Specimen Description BLOOD RIGHT ANTECUBITAL  Final   Special Requests   Final    BOTTLES DRAWN AEROBIC AND ANAEROBIC Blood Culture adequate volume   Culture   Final    NO GROWTH 1 DAY Performed at [facility], [address], [place], [place] [address]    Report Status PENDING  Incomplete  SARS CORONAVIRUS 2 (TAT 6-24 HRS) Nasopharyngeal Nasopharyngeal Swab     Status: None   Collection Time: 07-09-19  6:48 AM   Specimen: Nasopharyngeal Swab  Result Value Ref Range Status   SARS Coronavirus 2 NEGATIVE NEGATIVE Final    Comment: (NOTE) SARS-CoV-2 target nucleic acids are NOT DETECTED. The SARS-CoV-2 RNA is generally detectable in upper and lower respiratory specimens during the acute phase of infection. Negative results do not preclude SARS-CoV-2 infection, do not rule out co-infections with other pathogens, and should not be used as the sole basis for treatment or other patient management decisions. Negative results must be combined with clinical observations, patient history, and epidemiological information. The expected result is Negative. Fact Sheet for Patients: HairSlick.no Fact Sheet for Healthcare Providers: quierodirigir.com This test is not yet approved or cleared by the [place] FDA and  has been authorized for detection and/or diagnosis of SARS-CoV-2 by FDA under an Emergency Use Authorization (EUA). This EUA will remain  in effect (meaning this test can be used) for the duration of the COVID-19 declaration under Section  56 4(b)(1) of the Act, 21 U.S.C. section 360bbb-3(b)(1), unless the authorization is terminated or revoked sooner. Performed at [facility], [address], [place], [place] [address]   MRSA PCR Screening     Status: None   Collection Time: 09-Jul-2019  8:25 PM   Specimen: Nasal Mucosa; Nasopharyngeal  Result Value Ref Range Status   MRSA by PCR NEGATIVE NEGATIVE Final    Comment:        The  GeneXpert MRSA Assay (FDA approved for NASAL specimens only), is one component of a comprehensive MRSA colonization surveillance program. It is not intended to diagnose MRSA infection nor to guide or monitor treatment for MRSA infections. Performed at [facility], [address] [place], [place] [address]          Radiology Studies: Ct Abdomen Pelvis Wo Contrast  Result Date: 06/05/2019 CLINICAL DATA:  Abdominal pain, fever EXAM: CT ABDOMEN AND PELVIS WITHOUT CONTRAST TECHNIQUE: Multidetector CT imaging of the abdomen and pelvis was performed following the standard protocol without IV contrast. COMPARISON:  09/05/2017 FINDINGS: Lower chest: Moderate left pleural effusion and small right pleural effusion. Compressive atelectasis in the lower lobes. Heart is normal size. Densely calcified coronary arteries and aorta. Hepatobiliary: No focal hepatic abnormality. Gallbladder unremarkable. Pancreas: No focal abnormality or ductal dilatation. Spleen: No focal abnormality.  Normal size. Adrenals/Urinary Tract: Left adrenal nodules are low-density and similar to prior study, likely adenomas. No visible renal mass. No hydronephrosis. No renal or ureteral stones. Urinary bladder unremarkable. Stomach/Bowel: Large stool burden throughout the colon. There are mildly prominent and dilated small bowel loops into the pelvis. Distal small bowel loops appear decompressed and grossly unremarkable. Findings are concerning for partial small bowel obstruction. Vascular/Lymphatic: Heavily calcified aorta and  iliac vessels. No aneurysm or adenopathy. Reproductive: Mildly prominent prostate. Other: No free fluid or free air. Musculoskeletal: No acute bony abnormality. IMPRESSION: Mildly dilated small bowel loops with distal small bowel loops decompressed. Findings are concerning for partial small bowel obstruction. Exact transition or cause not visualized. Large stool burden throughout the colon. Moderate left pleural effusion and small right pleural effusion. Bibasilar atelectasis. Left adrenal nodules again noted, similar to prior study, likely adenomas. Heavily calcified aorta and iliac vessels. Electronically Signed   By: Charlett Nose M.D.   On: 06/18/2019 21:43   Ct Chest Wo Contrast  Result Date: June 15, 2019 CLINICAL DATA:  Left-sided chest pain. Shortness of breath. EXAM: CT CHEST WITHOUT CONTRAST TECHNIQUE: Multidetector CT imaging of the chest was performed following the standard protocol without IV contrast. COMPARISON:  Chest x-ray dated 15-Jun-2019 and CT scan of the abdomen dated 09/05/2017 FINDINGS: Cardiovascular: Aortic atherosclerosis. Coronary artery calcifications. Heart size is normal. No pericardial effusion. Mediastinum/Nodes: No enlarged mediastinal or axillary lymph nodes. Thyroid gland, trachea, and esophagus demonstrate no significant findings. Lungs/Pleura: There are no infiltrates. There is a small left pleural effusion. Tiny right effusion. Minimal secondary atelectasis at the lung bases. The lungs are otherwise clear. Upper Abdomen: There is chronic bilateral adrenal enlargement. Tiny amount of ascites in the left upper quadrant. Musculoskeletal: No chest wall mass or suspicious bone lesions identified. IMPRESSION: 1. Small left and tiny right pleural effusions with secondary atelectasis at the lung bases. 2. Chronic bilateral adrenal enlargement. 3. Tiny amount of ascites in the left upper quadrant. 4. Aortic Atherosclerosis (ICD10-I70.0). Electronically Signed   By: Francene Boyers M.D.    On: Jun 15, 2019 12:14   Nm Pulmonary Perfusion  Result Date: 15-Jun-2019 CLINICAL DATA:  Chest pain and shortness of breath EXAM: NUCLEAR MEDICINE PERFUSION LUNG SCAN TECHNIQUE: Perfusion images were obtained in multiple projections after intravenous injection of radiopharmaceutical. Ventilation scans intentionally deferred if perfusion scan and chest x-ray adequate for interpretation during COVID 19 epidemic. Views: Anterior, posterior, left lateral, right lateral, RPO, LPO, RAO, LAO RADIOPHARMACEUTICALS:  1.5 mCi Tc-59m MAA IV COMPARISON:  Chest radiograph and chest CT 05/30/2019 FINDINGS: Radiotracer uptake is homogeneous and symmetric bilaterally. No perfusion defects evident. IMPRESSION:  No appreciable perfusion defects. Very low probability of pulmonary embolus. Electronically Signed   By: Bretta Bang III M.D.   On: 06/25/19 15:21   Dg Chest Port 1 View  Result Date: 06-25-19 CLINICAL DATA:  73 year old male with left side chest pain, fatigue. EXAM: PORTABLE CHEST 1 VIEW COMPARISON:  Chest radiographs 05/21/2018 and earlier. FINDINGS: Portable AP upright view at 0243 hours. Stable lung volumes. Mediastinal contours remain normal. Visualized tracheal air column is within normal limits. Allowing for portable technique the lungs are clear. No pneumothorax. Negative visible bowel gas pattern. No acute osseous abnormality identified. IMPRESSION: Negative portable chest. Electronically Signed   By: Odessa Fleming M.D.   On: 25-Jun-2019 02:56        Scheduled Meds:  aspirin EC  81 mg Oral Daily   Chlorhexidine Gluconate Cloth  6 each Topical Daily   heparin  5,000 Units Subcutaneous Q8H   insulin glargine  10 Units Subcutaneous Daily   mouth rinse  15 mL Mouth Rinse BID   metoprolol tartrate  25 mg Oral BID   Continuous Infusions:  sodium chloride 100 mL/hr at 06/10/19 0800   diltiazem (CARDIZEM) infusion 5 mg/hr (06/10/19 1355)     LOS: 0 days    Time spent: 30  minutes    Lalah Durango Hoover Brunette, DO [facility] Pager [phone]  If 7PM-7AM, please contact night-coverage [email or web address] Password [email or web address] 06/10/2019, 2:12 PM

## 2019-06-10 NOTE — Progress Notes (Signed)
Inpatient Diabetes Program Recommendations  AACE/ADA: New Consensus Statement on Inpatient Glycemic Control (2015)  Target Ranges:  Prepandial:   less than 140 mg/dL      Peak postprandial:   less than 180 mg/dL (1-2 hours)      Critically ill patients:  140 - 180 mg/dL   Results for [name] [name], [name] [name] (MRN [id number]) as of 06/10/2019 06:57  Ref. Range 2019/07/02 18:46 July 02, 2019 20:02 07-02-19 21:30 2019-07-02 22:32 07/02/19 23:27 06/10/2019 00:26 06/10/2019 01:22 06/10/2019 02:26 06/10/2019 03:30 06/10/2019 04:26 06/10/2019 05:30 06/10/2019 06:30  Glucose-Capillary Latest Ref Range: 70 - 99 mg/dL 304 (H)  IV Insulin Drip 309 (H)  IV Insulin Drip 297 (H)  IV Insulin Drip 267 (H)  IV Insulin Drip 205 (H)  IV Insulin Drip 183 (H)  IV Insulin Drip 210 (H)  IV Insulin Drip 175 (H)  IV Insulin Drip 161 (H)  IV Insulin Drip 134 (H)  IV Insulin Drip 135 (H)  IV Insulin Drip 166 (H)  IV Insulin Drip OFF    Admit with: DKA (possibly due to empagliflozin)/ SIRS  History: DM, Cardiomyopathy  Home DM Meds: Glyxambi 10/5 mg Daily          Glipizide 10 mg daily       Metformin 1000 mg BID  Current Orders: Lantus 10 units Daily     PCP: Dr. Donia [facility]--last seen 04/15/2019--No changes made to pt's diabetes medications at that visit.  Transitioning off the IV Insulin Drip this AM to Lantus--Lantus 10 units Daily ordered for 7am today.  Current A1c level pending.     MD- Please also place orders for Novolog Sensitive Correction Scale/ SSI (0-9 units) TID AC + HS     --Will follow patient during hospitalization--  Wyn Quaker RN, MSN, CDE Diabetes Coordinator Inpatient Glycemic Control Team Team Pager: [phone] (8a-5p)

## 2019-06-10 NOTE — Progress Notes (Signed)
[name] [name] paged and made aware of pts  HR sustaining in the 140-150s. Adv of earlier order of bolus given. MD stated to wait for labs to return. Adv BMP had resulted and HR sustaining in 150s. Waiting for call back/ordres.

## 2019-06-10 NOTE — Progress Notes (Signed)
Dr. Darrick Meigs paged once again about patients HR sustaining in the 150s- new order for Metoprolol.

## 2019-06-10 NOTE — ED Provider Notes (Signed)
Asked to intubate patient in ICU per hospitalist team and Dr. Lucile Shutters of [facility]. Physical Exam  BP (!) 161/108   Pulse 72   Temp (!) 101.7 F (38.7 C)   Resp (!) 30   Ht _0  (1.803 m)   Wt 62.7 kg   SpO2 100%   BMI 19.28 kg/m   Procedure Name: Intubation Date/Time: 06/10/2019 11:03 PM Performed by: Ezequiel Essex, MD Pre-anesthesia Checklist: Patient identified, Patient being monitored, Emergency Drugs available, Timeout performed and Suction available Oxygen Delivery Method: Non-rebreather mask Preoxygenation: Pre-oxygenation with 100% oxygen Induction Type: Rapid sequence Ventilation: Mask ventilation without difficulty Laryngoscope Size: Glidescope, Mac and 4 Grade View: Grade I Tube size: 7.5 mm Number of attempts: 1 Airway Equipment and Method: Video-laryngoscopy,  Stylet and Patient positioned with wedge pillow Placement Confirmation: ETT inserted through vocal cords under direct vision,  CO2 detector and Breath sounds checked- equal and bilateral Secured at: 25 cm Tube secured with: ETT holder Dental Injury: Teeth and Oropharynx as per pre-operative assessment  Future Recommendations: Recommend- induction with short-acting agent, and alternative techniques readily available           Ezequiel Essex, MD 06/10/19 2304

## 2019-06-10 NOTE — Progress Notes (Signed)
Initial Nutrition Assessment  DOCUMENTATION CODES:   Severe malnutrition in context of chronic illness  INTERVENTION:  -Glucerna Shake po TID, each supplement provides 220 kcal and 10 grams of protein (chocolate or strawberry) -Pro-stat 30 ml po BID, each supplement provides 100 kcal and 15 grams of protein -MVI with minerals daily   NUTRITION DIAGNOSIS:   Severe Malnutrition related to chronic illness(CAD;hypertrophic cardiomyopathy; COVID+ 03/21/19) as evidenced by moderate fat depletion, severe fat depletion, severe muscle depletion.   GOAL:   Patient will meet greater than or equal to 90% of their needs   MONITOR:   PO intake, Labs, Weight trends, I & O's, Supplement acceptance  REASON FOR ASSESSMENT:   Malnutrition Screening Tool    ASSESSMENT:  73 year old male with medical history significant of DM, HTN, HLD, hypertrophic cardiomyopathy, CAD s/p PCI of LAD in 2009 who presented to ED with complaints of increasing SOB, generalized weakness, left shoulder pain that radiates into left arm into left torso, fever and lower abdominal pain over the past few days. CXR and CT without any acute findings; urinalysis showed evidence of ketones with blood sugar just under 250 in ED.   Patient admitted with metabolic acidosis. 8/29 Covid+ treated at home.  Per chart review, patient with sustained HR and BP this morning, EKG obtained. Patient reports ongoing shortness of breath this afternoon, wife at bedside. He endorses good po intake of lunch today and recalls eating most of his beefaroni and vegetable medley. Patient recalls 2 meals/day with occasional snacks at home. Wife stated that he gets up each morning and prepares himself a "good breakfast" and she cooks dinner in the evenings. Patient recalls having an energy drink and snacking on fruit in the afternoon. RD encouraged intake of daily nutrition supplement in the afternoon verses consuming energy drink.   Patient endorses more  recent UBW of 136 lbs s/p Covid infection in August. Wife of patient reported that she started noticing patient losing weight beginning in July without any changes to appetite/intake and recalls 145-150 lbs at that time. Severe fat and muscle depletions noted on nutrition focused physical exam. Patient would benefit from nutrient dense supplement; amenable to Glucerna in chocolate or strawberry. Will provide Glucerna and Pro-stat to aid with calorie/protein needs.  Weight history reviewed; noted 6.9 lb (4.8%) wt loss over the past 7 months. Although, insignificant for time frame it is concerning given advanced age and recent Covid infection. On 4/13 pt wt 65.8 kg (144.8 lb), 05/30/18 pt wt 69.2 kg (152.2 lb)  Medications reviewed and include: Lantus 10 units daily NaCl @ 100 ml/hr Labs: CBGS 105-166, BUN 35 (H), Cr 2.34 (H), hypokalemia - resolved Lab Results  Component Value Date   HGBA1C 9.1 (H) 06/02/2019    NUTRITION - FOCUSED PHYSICAL EXAM: 06/10/19 Findings: moderate fat depletion in buccal and orbital regions; severe fat depletion in upper arm, thoracic and lumbar regions. Severe muscle depletions in temporal, clavicle, clavicle and acromion bone, dorsal hand, patellar, anterior thigh, and posterior calf regions.    Diet Order:   Diet Order            Diet Carb Modified Fluid consistency: Thin; Room service appropriate? Yes  Diet effective now              EDUCATION NEEDS:   Education needs have been addressed  Skin:  Skin Assessment: Reviewed RN Assessment  Last BM:  PTA  Height:   Ht Readings from Last 1 Encounters:  June 15, 2019 5'  11" (1.803 m)    Weight:   Wt Readings from Last 1 Encounters:  06/10/19 62.7 kg    Ideal Body Weight:  78.2 kg  BMI:  Body mass index is 19.28 kg/m.  Estimated Nutritional Needs:   Kcal:  1900-2100  Protein:  100-110  Fluid:  >/= 1.8 L/day   Lars Masson, RD, LDN Clinical Nutrition Office [phone] After  Hours/Weekend Pager: [phone]

## 2019-06-10 NOTE — Progress Notes (Signed)
MD Manuella Ghazi notified of sustained HR and BP despite PO and IV Metoprolol. EKG obtained per order.

## 2019-06-10 NOTE — Progress Notes (Signed)
Dr. Maudie Mercury paged and made aware of respiratory status still increased to RR of 40s, on bipap. RN asked for morphine. Also asked about insulin gtt since MD stated earlier that his labs have not been closed. Will continue to monitor pt

## 2019-06-10 NOTE — Progress Notes (Signed)
MD Manuella Ghazi notified pt is on 12.5mg /hr of Cardizem with no change in HR, pt is still sustaining 120s-130s.

## 2019-06-10 NOTE — Progress Notes (Signed)
Dr. Maudie Mercury paged and made aware of HR 130s- RR 40s. Pt on bipap-

## 2019-06-10 NOTE — Progress Notes (Signed)
MD on call paged to make aware of pts HR sustaining in 130s- RR40s. Respiratory made aware. Pt placed on bipap. Dr. Maudie Mercury stated he wanted to place pt back on insulin gtt

## 2019-06-11 ENCOUNTER — Inpatient Hospital Stay (HOSPITAL_COMMUNITY): Payer: Medicare Other

## 2019-06-11 DIAGNOSIS — R140 Abdominal distension (gaseous): Secondary | ICD-10-CM

## 2019-06-11 DIAGNOSIS — R6520 Severe sepsis without septic shock: Secondary | ICD-10-CM

## 2019-06-11 DIAGNOSIS — E43 Unspecified severe protein-calorie malnutrition: Secondary | ICD-10-CM | POA: Insufficient documentation

## 2019-06-11 DIAGNOSIS — R9431 Abnormal electrocardiogram [ECG] [EKG]: Secondary | ICD-10-CM

## 2019-06-11 DIAGNOSIS — K56609 Unspecified intestinal obstruction, unspecified as to partial versus complete obstruction: Secondary | ICD-10-CM

## 2019-06-11 DIAGNOSIS — N179 Acute kidney failure, unspecified: Secondary | ICD-10-CM

## 2019-06-11 DIAGNOSIS — J9601 Acute respiratory failure with hypoxia: Secondary | ICD-10-CM

## 2019-06-11 DIAGNOSIS — A419 Sepsis, unspecified organism: Principal | ICD-10-CM

## 2019-06-11 LAB — BASIC METABOLIC PANEL
Anion gap: 14 (ref 5–15)
Anion gap: 12 (ref 5–15)
Anion gap: 17 — ABNORMAL HIGH (ref 5–15)
Anion gap: 10 (ref 5–15)
Anion gap: 13 (ref 5–15)
BUN: 50 mg/dL — ABNORMAL HIGH (ref 8–23)
BUN: 56 mg/dL — ABNORMAL HIGH (ref 8–23)
BUN: 60 mg/dL — ABNORMAL HIGH (ref 8–23)
BUN: 61 mg/dL — ABNORMAL HIGH (ref 8–23)
BUN: 65 mg/dL — ABNORMAL HIGH (ref 8–23)
CO2: 13 mmol/L — ABNORMAL LOW (ref 22–32)
CO2: 15 mmol/L — ABNORMAL LOW (ref 22–32)
CO2: 13 mmol/L — ABNORMAL LOW (ref 22–32)
CO2: 16 mmol/L — ABNORMAL LOW (ref 22–32)
CO2: 14 mmol/L — ABNORMAL LOW (ref 22–32)
Calcium: 8.3 mg/dL — ABNORMAL LOW (ref 8.9–10.3)
Calcium: 8 mg/dL — ABNORMAL LOW (ref 8.9–10.3)
Calcium: 8.4 mg/dL — ABNORMAL LOW (ref 8.9–10.3)
Calcium: 7.5 mg/dL — ABNORMAL LOW (ref 8.9–10.3)
Calcium: 7.4 mg/dL — ABNORMAL LOW (ref 8.9–10.3)
Chloride: 110 mmol/L (ref 98–111)
Chloride: 108 mmol/L (ref 98–111)
Chloride: 108 mmol/L (ref 98–111)
Chloride: 111 mmol/L (ref 98–111)
Chloride: 110 mmol/L (ref 98–111)
Creatinine, Ser: 3.48 mg/dL — ABNORMAL HIGH (ref 0.61–1.24)
Creatinine, Ser: 3.9 mg/dL — ABNORMAL HIGH (ref 0.61–1.24)
Creatinine, Ser: 4.31 mg/dL — ABNORMAL HIGH (ref 0.61–1.24)
Creatinine, Ser: 4.24 mg/dL — ABNORMAL HIGH (ref 0.61–1.24)
Creatinine, Ser: 4.44 mg/dL — ABNORMAL HIGH (ref 0.61–1.24)
GFR calc Af Amer: 19 mL/min — ABNORMAL LOW (ref >60)
GFR calc Af Amer: 17 mL/min — ABNORMAL LOW (ref >60)
GFR calc Af Amer: 15 mL/min — ABNORMAL LOW (ref >60)
GFR calc Af Amer: 15 mL/min — ABNORMAL LOW (ref >60)
GFR calc Af Amer: 14 mL/min — ABNORMAL LOW (ref >60)
GFR calc non Af Amer: 16 mL/min — ABNORMAL LOW (ref >60)
GFR calc non Af Amer: 14 mL/min — ABNORMAL LOW (ref >60)
GFR calc non Af Amer: 13 mL/min — ABNORMAL LOW (ref >60)
GFR calc non Af Amer: 13 mL/min — ABNORMAL LOW (ref >60)
GFR calc non Af Amer: 12 mL/min — ABNORMAL LOW (ref >60)
Glucose, Bld: 305 mg/dL — ABNORMAL HIGH (ref 70–99)
Glucose, Bld: 319 mg/dL — ABNORMAL HIGH (ref 70–99)
Glucose, Bld: 221 mg/dL — ABNORMAL HIGH (ref 70–99)
Glucose, Bld: 165 mg/dL — ABNORMAL HIGH (ref 70–99)
Glucose, Bld: 148 mg/dL — ABNORMAL HIGH (ref 70–99)
Potassium: 4.3 mmol/L (ref 3.5–5.1)
Potassium: 4.5 mmol/L (ref 3.5–5.1)
Potassium: 4.2 mmol/L (ref 3.5–5.1)
Potassium: 3.8 mmol/L (ref 3.5–5.1)
Potassium: 4.1 mmol/L (ref 3.5–5.1)
Sodium: 137 mmol/L (ref 135–145)
Sodium: 135 mmol/L (ref 135–145)
Sodium: 138 mmol/L (ref 135–145)
Sodium: 137 mmol/L (ref 135–145)
Sodium: 137 mmol/L (ref 135–145)

## 2019-06-11 LAB — ECHOCARDIOGRAM LIMITED
Height: 71 in
Weight: 2321 oz

## 2019-06-11 LAB — DRUG PROFILE, UR, 9 DRUGS (LABCORP)
Amphetamines, Urine: NEGATIVE ng/mL
Barbiturate, Ur: NEGATIVE ng/mL
Benzodiazepine Quant, Ur: NEGATIVE ng/mL
Cannabinoid Quant, Ur: NEGATIVE ng/mL
Cocaine (Metab.): NEGATIVE ng/mL
Methadone Screen, Urine: NEGATIVE ng/mL
Opiate Quant, Ur: NEGATIVE ng/mL
Phencyclidine, Ur: NEGATIVE ng/mL
Propoxyphene, Urine: NEGATIVE ng/mL

## 2019-06-11 LAB — BLOOD GAS, ARTERIAL
Acid-base deficit: 10.2 mmol/L — ABNORMAL HIGH (ref 0.0–2.0)
Acid-base deficit: 10.2 mmol/L — ABNORMAL HIGH (ref 0.0–2.0)
Bicarbonate: 17.2 mmol/L — ABNORMAL LOW (ref 20.0–28.0)
Bicarbonate: 17.4 mmol/L — ABNORMAL LOW (ref 20.0–28.0)
Drawn by: 27407
FIO2: 100
FIO2: 100
O2 Saturation: 97.3 %
O2 Saturation: 98.3 %
Patient temperature: 38.9
Patient temperature: 39.2
pCO2 arterial: 22.3 mmHg — ABNORMAL LOW (ref 32.0–48.0)
pCO2 arterial: 23 mmHg — ABNORMAL LOW (ref 32.0–48.0)
pH, Arterial: 7.396 (ref 7.350–7.450)
pH, Arterial: 7.403 (ref 7.350–7.450)
pO2, Arterial: 102 mmHg (ref 83.0–108.0)
pO2, Arterial: 136 mmHg — ABNORMAL HIGH (ref 83.0–108.0)

## 2019-06-11 LAB — GLUCOSE, CAPILLARY
Glucose-Capillary: 313 mg/dL — ABNORMAL HIGH (ref 70–99)
Glucose-Capillary: 289 mg/dL — ABNORMAL HIGH (ref 70–99)
Glucose-Capillary: 307 mg/dL — ABNORMAL HIGH (ref 70–99)
Glucose-Capillary: 240 mg/dL — ABNORMAL HIGH (ref 70–99)
Glucose-Capillary: 210 mg/dL — ABNORMAL HIGH (ref 70–99)
Glucose-Capillary: 195 mg/dL — ABNORMAL HIGH (ref 70–99)
Glucose-Capillary: 157 mg/dL — ABNORMAL HIGH (ref 70–99)
Glucose-Capillary: 153 mg/dL — ABNORMAL HIGH (ref 70–99)
Glucose-Capillary: 140 mg/dL — ABNORMAL HIGH (ref 70–99)
Glucose-Capillary: 120 mg/dL — ABNORMAL HIGH (ref 70–99)
Glucose-Capillary: 140 mg/dL — ABNORMAL HIGH (ref 70–99)
Glucose-Capillary: 137 mg/dL — ABNORMAL HIGH (ref 70–99)
Glucose-Capillary: 209 mg/dL — ABNORMAL HIGH (ref 70–99)
Glucose-Capillary: 142 mg/dL — ABNORMAL HIGH (ref 70–99)
Glucose-Capillary: 157 mg/dL — ABNORMAL HIGH (ref 70–99)
Glucose-Capillary: 137 mg/dL — ABNORMAL HIGH (ref 70–99)
Glucose-Capillary: 170 mg/dL — ABNORMAL HIGH (ref 70–99)
Glucose-Capillary: 201 mg/dL — ABNORMAL HIGH (ref 70–99)
Glucose-Capillary: 193 mg/dL — ABNORMAL HIGH (ref 70–99)

## 2019-06-11 LAB — LACTIC ACID, PLASMA
Lactic Acid, Venous: 4.5 mmol/L (ref 0.5–1.9)
Lactic Acid, Venous: 2.4 mmol/L (ref 0.5–1.9)
Lactic Acid, Venous: 5.1 mmol/L (ref 0.5–1.9)

## 2019-06-11 LAB — PROTIME-INR
INR: 1.5 — ABNORMAL HIGH (ref 0.8–1.2)
Prothrombin Time: 17.8 seconds — ABNORMAL HIGH (ref 11.4–15.2)

## 2019-06-11 LAB — STREP PNEUMONIAE URINARY ANTIGEN: Strep Pneumo Urinary Antigen: NEGATIVE

## 2019-06-11 LAB — CBC
HCT: 43.9 % (ref 39.0–52.0)
Hemoglobin: 14.2 g/dL (ref 13.0–17.0)
MCH: 28.7 pg (ref 26.0–34.0)
MCHC: 32.3 g/dL (ref 30.0–36.0)
MCV: 88.7 fL (ref 80.0–100.0)
Platelets: 259 10*3/uL (ref 150–400)
RBC: 4.95 MIL/uL (ref 4.22–5.81)
RDW: 13.3 % (ref 11.5–15.5)
WBC: 9.5 10*3/uL (ref 4.0–10.5)
nRBC: 0.6 % — ABNORMAL HIGH (ref 0.0–0.2)

## 2019-06-11 LAB — BETA-HYDROXYBUTYRIC ACID
Beta-Hydroxybutyric Acid: 0.77 mmol/L — ABNORMAL HIGH (ref 0.05–0.27)
Beta-Hydroxybutyric Acid: 0.08 mmol/L (ref 0.05–0.27)

## 2019-06-11 LAB — MAGNESIUM: Magnesium: 2 mg/dL (ref 1.7–2.4)

## 2019-06-11 LAB — TRIGLYCERIDES: Triglycerides: 65 mg/dL (ref <150)

## 2019-06-11 MED ORDER — ACETAMINOPHEN 650 MG RE SUPP
650.0000 mg | RECTAL | Status: DC | PRN
  Administered 2019-06-11 – 2019-06-12 (×3): 650 mg via RECTAL
  Filled 2019-06-11 (×3): qty 1

## 2019-06-11 MED ORDER — SODIUM CHLORIDE 0.9 % IV SOLN
INTRAVENOUS | Status: DC
  Administered 2019-06-11: 06:00:00 via INTRAVENOUS

## 2019-06-11 MED ORDER — DEXTROSE 50 % IV SOLN: 0.0000 mL | INTRAVENOUS | Status: DC | PRN

## 2019-06-11 MED ORDER — FENTANYL 2500MCG IN NS 250ML (10MCG/ML) PREMIX INFUSION
0.0000 ug/h | INTRAVENOUS | Status: DC
  Administered 2019-06-11: 25 ug/h via INTRAVENOUS
  Administered 2019-06-13: 50 ug/h via INTRAVENOUS
  Filled 2019-06-11 (×2): qty 250

## 2019-06-11 MED ORDER — METOPROLOL TARTRATE 5 MG/5ML IV SOLN
5.0000 mg | Freq: Once | INTRAVENOUS | Status: AC
  Administered 2019-06-11: 5 mg via INTRAVENOUS
  Filled 2019-06-11: qty 5

## 2019-06-11 MED ORDER — FENTANYL CITRATE (PF) 100 MCG/2ML IJ SOLN
25.0000 ug | INTRAMUSCULAR | Status: DC | PRN
  Filled 2019-06-11: qty 2

## 2019-06-11 MED ORDER — INSULIN REGULAR(HUMAN) IN NACL 100-0.9 UT/100ML-% IV SOLN: INTRAVENOUS | Status: DC

## 2019-06-11 MED ORDER — PROPOFOL 1000 MG/100ML IV EMUL
5.0000 ug/kg/min | INTRAVENOUS | Status: DC
  Administered 2019-06-11: 30.037 ug/kg/min via INTRAVENOUS
  Administered 2019-06-11: 5 ug/kg/min via INTRAVENOUS
  Administered 2019-06-11: 30.037 ug/kg/min via INTRAVENOUS
  Administered 2019-06-12 (×3): 35 ug/kg/min via INTRAVENOUS
  Administered 2019-06-13: 40 ug/kg/min via INTRAVENOUS
  Filled 2019-06-11 (×8): qty 100

## 2019-06-11 MED ORDER — VANCOMYCIN HCL 500 MG IV SOLR
500.0000 mg | INTRAVENOUS | Status: DC
  Filled 2019-06-11: qty 500

## 2019-06-11 MED ORDER — INSULIN REGULAR(HUMAN) IN NACL 100-0.9 UT/100ML-% IV SOLN
INTRAVENOUS | Status: DC
  Administered 2019-06-11: 7.5 [IU]/h via INTRAVENOUS
  Filled 2019-06-11: qty 100

## 2019-06-11 MED ORDER — LACTATED RINGERS IV BOLUS
2000.0000 mL | Freq: Once | INTRAVENOUS | Status: AC
  Administered 2019-06-11: 2000 mL via INTRAVENOUS

## 2019-06-11 MED ORDER — ORAL CARE MOUTH RINSE
15.0000 mL | OROMUCOSAL | Status: DC
  Administered 2019-06-11 – 2019-06-14 (×28): 15 mL via OROMUCOSAL

## 2019-06-11 MED ORDER — DEXTROSE-NACL 5-0.45 % IV SOLN: INTRAVENOUS | Status: DC

## 2019-06-11 MED ORDER — DEXTROSE-NACL 5-0.45 % IV SOLN
INTRAVENOUS | Status: DC
  Administered 2019-06-11 – 2019-06-12 (×3): via INTRAVENOUS

## 2019-06-11 MED ORDER — SODIUM CHLORIDE 0.9 % IV SOLN: INTRAVENOUS | Status: DC

## 2019-06-11 MED ORDER — SODIUM CHLORIDE 0.9 % IV SOLN
1.0000 g | INTRAVENOUS | Status: DC
  Administered 2019-06-11 – 2019-06-13 (×3): 1 g via INTRAVENOUS
  Filled 2019-06-11 (×6): qty 1

## 2019-06-11 MED ORDER — POTASSIUM CHLORIDE 10 MEQ/100ML IV SOLN
10.0000 meq | INTRAVENOUS | Status: DC
  Filled 2019-06-11: qty 100

## 2019-06-11 MED ORDER — STERILE WATER FOR INJECTION IV SOLN
INTRAVENOUS | Status: DC
  Administered 2019-06-11 – 2019-06-13 (×3): via INTRAVENOUS
  Filled 2019-06-11 (×16): qty 850

## 2019-06-11 NOTE — Progress Notes (Signed)
MD Manuella Ghazi notified urine output since 0700 has been 174mL. No new orders at this time.

## 2019-06-11 NOTE — Progress Notes (Signed)
MD notified of chest x-ray critical result.

## 2019-06-11 NOTE — Progress Notes (Signed)
Dr. Jenetta Downer paged with Elink to make aware of patients agitation. Blood sugar 315. States he will put order in for insulin drip and fentanyl gtt. Willcontinue to monitor pt

## 2019-06-11 NOTE — Progress Notes (Signed)
PROGRESS NOTE    [name] [name]  XAJ:[id number] DOB: 04-Oct-1945 DOA: 06/21/2019 PCP: Ann Held, MD   Brief Narrative:  Per HPI: [name] [name] a 73 y.o.malewith medical history significant ofdiabetes, hypertension, hypertrophic cardiomyopathy, presents to the emergency room with increasing shortness of breath, generalized weakness, left shoulder pain that radiates into his left arm, left neck and down his left torso. This discomfort is worse when he tries to get up and move/lift something with his arm. He has not had any cough. His wife does report him having a fever of 101[place]earlier today. He describes some lower abdominal pain for the past few days. He has had 2-3 bowel movements today, but they were not reported to be loose. He has had no nausea or vomiting. He does occasionally have dysuria.  11/18: Patient will be transitioned off IV insulin drip today as DKA has resolved.  Unfortunately, he continues to have elevated heart rates.  We will increase metoprolol oral dose which has been started earlier this morning.  Will start Cardizem drip after bolus and obtain further evaluation with toxicology as well as TSH.  Considering cardiology evaluation by a.m. if he remains with elevated heart rates and/or unstable.  11/19: Patient continues to have significant sinus tachycardia on the afternoon of 11/18 and case was discussed with cardiology Dr. Harl Bowie.  It was recommended that the sinus tachycardia not be suppressed any further as there should be some secondary cause and therefore, Cardizem drip as well as metoprolol were both discontinued.  Unfortunately, patient began to have some worsening tachypnea as well and was ultimately intubated and started back on insulin drip due to worsening DKA.  He had a repeat CT of his abdomen and pelvis demonstrating now a small bowel obstruction with a clear transition point and some worsening dilation in the small bowel loops.  Additionally, he is  now noted to have layering of effusions on the left side of his lung with signs of infiltrate.  He was resumed on vancomycin as well as cefepime.  He has worsening kidney injury and poor urine output noted as well.  He is in critical condition and is currently sedated on the ventilator with FiO2 100% and with propofol and fentanyl for sedation.  General surgery and pulmonology have been consulted for assistance in management.  I will also consult nephrology for assistance in management with acute kidney injury with oliguria as well as acidosis.  Assessment & Plan:   Active Problems:   Hyperlipidemia   Hypertrophic cardiomyopathy ([place])   Hypertension   Type II diabetes mellitus ([place])   Metabolic acidosis   DKA (diabetic ketoacidoses) (HCC)   AKI (acute kidney injury) ([place])   Acute respiratory failure with hypoxia (HCC)   Abdominal distension   Severe sepsis secondary to left-sided HCAP with fever -We will check lactic acid levels and follow CBC as well as microbiology -Continue on vancomycin and cefepime for coverage -Central line placement per general surgery appreciated -MRSA PCR negative on 11/17 and may consider discontinuation in the next 1-2 days  Small bowel obstruction -This had progressed from a partial small bowel obstruction noted on initial CT -Patient was tolerating diet yesterday with no nausea or vomiting on 11/18, but had worsening distention prompting CT of the abdomen -Appreciate general surgery evaluation -Keep n.p.o. and on NG tube to low intermittent suction -Continue IV fluid and monitor potassium and magnesium which are currently at goal  DKA in the setting of type 2 diabetes secondary to above processes -  Insulin drip has been restarted due to worsening hyperglycemia and recurrence of anion gap -Continue to monitor closely and glucose stabilizer protocol -Maintain on IV fluid and keep n.p.o.  Acute respiratory failure secondary to distress/tachypnea with  HCAP -Patient currently on the ventilator due to distress related to multiple acute disease processes -Continue ventilator support -Appreciate pulmonology involvement with repeat ABG ordered and pending  Worsening AKI with oliguria and acidosis -Continue on IV fluid with no signs of hydronephrosis noted -Urine electrolytes with FeNa 0.9% showing prerenal causes -We will consult nephrology for further assistance in management due to significantly elevated creatinine of 3.9 today with baseline that appears to be less than 1  Ongoing sinus tachycardia -Avoid further rate suppression and treat underlying processes as noted above -Limited 2D echocardiogram ordered with recent performed earlier this month with LVEF 60-65% and grade 1 diastolic dysfunction -TSH within normal limits -Does not appear to have withdrawal symptoms  History of hypertension -Holding home medications to include lisinopril and Lasix  History of hypertrophic cardiomyopathy and CAD with prior PCI -Recent 2D echocardiogram performed on 05/25/2019 -Follows with Dr. Debara Pickett outpatient  DVT prophylaxis: Heparin Code Status: Full Family Communication: We will plan to call family Disposition Plan: Continue on insulin drip as well as vancomycin and cefepime for pneumonia.  Continue IV fluid and monitor labs carefully.  Appreciate consultation from specialists.  Patient will need central line and this has been discussed with general surgery who will assist with placement.   Consultants:   General surgery  Pulmonology  Nephrology  Procedures:   Intubation on 11/18  Antimicrobials:  Anti-infectives (From admission, onward)   Start     Dose/Rate Route Frequency Ordered Stop   06/11/19 1800  vancomycin (VANCOCIN) 500 mg in sodium chloride 0.9 % 100 mL IVPB     500 mg 100 mL/hr over 60 Minutes Intravenous Every 48 hours 06/11/19 0112     06/11/19 0200  ceFEPIme (MAXIPIME) 1 g in sodium chloride 0.9 % 100 mL IVPB     1  g 200 mL/hr over 30 Minutes Intravenous Every 24 hours 06/11/19 0112     06/10/19 2000  vancomycin (VANCOCIN) IVPB 750 mg/150 ml premix  Status:  Discontinued     750 mg 150 mL/hr over 60 Minutes Intravenous Every 24 hours 05/26/2019 1826 06/10/19 0743   06/14/2019 2300  piperacillin-tazobactam (ZOSYN) IVPB 3.375 g  Status:  Discontinued     3.375 g 12.5 mL/hr over 240 Minutes Intravenous Every 8 hours 06/18/2019 1826 06/10/19 0743   05/26/2019 1830  vancomycin (VANCOCIN) IVPB 1000 mg/200 mL premix     1,000 mg 200 mL/hr over 60 Minutes Intravenous  Once 06/08/2019 1818 06/08/2019 2102   06/08/2019 1830  piperacillin-tazobactam (ZOSYN) IVPB 3.375 g  Status:  Discontinued     3.375 g 12.5 mL/hr over 240 Minutes Intravenous Every 8 hours 06/17/2019 1818 06/10/19 0743       Subjective: Patient seen and evaluated today and is currently sedated after being intubated overnight due to severe tachypnea and tachycardia.  He is currently had minimal to no urine output overnight and currently remains tachycardic and febrile.  Objective: Vitals:   06/11/19 0500 06/11/19 0530 06/11/19 0600 06/11/19 0630  BP: 121/64 115/74 (!) 130/93 124/62  Pulse:  (!) 124 (!) 127 (!) 59  Resp: (!) 29 (!) 28 (!) 25 (!) 27  Temp: (!) 101.3 F (38.5 C) (!) 101.7 F (38.7 C) (!) 102 F (38.9 C) (!) 102.2 F (39 C)  TempSrc:      SpO2:  100% 100% 100%  Weight: 65.8 kg     Height:        Intake/Output Summary (Last 24 hours) at 06/11/2019 0718 Last data filed at 06/11/2019 0700 Gross per 24 hour  Intake 3588.01 ml  Output 600 ml  Net 2988.01 ml   Filed Weights   05/26/2019 2030 06/10/19 0500 06/11/19 0500  Weight: 62.7 kg 62.7 kg 65.8 kg    Examination:  General exam: Sedated on ventilator Respiratory system: Clear to auscultation. Respiratory effort normal.  Currently intubated on FiO2 100%. Cardiovascular system: S1 & S2 heard, RRR, tachycardic. No JVD, murmurs, rubs, gallops or clicks. No pedal  edema. Gastrointestinal system: Abdomen is minimally distended, tight and nontender. No organomegaly or masses felt. Normal bowel sounds heard.  No signs of guarding noted Central nervous system: Sedated Extremities: No significant edema Skin: No rashes, lesions or ulcers Psychiatry: Cannot be assessed due to sedation on the ventilator    Data Reviewed: I have personally reviewed following labs and imaging studies  CBC: Recent Labs  Lab 06/06/2019 0337 06/10/19 2127 06/11/19 0034  WBC 4.1 7.3 9.5  NEUTROABS 3.5  --   --   HGB 11.7* 14.0 14.2  HCT 35.9* 43.3 43.9  MCV 88.9 88.0 88.7  PLT 211 256 099   Basic Metabolic Panel: Recent Labs  Lab 06/10/19 0425 06/10/19 0852 06/10/19 1238 06/10/19 2127 06/11/19 0034 06/11/19 0614  NA 140 140 139 134* 137 135  K 3.2* 4.4 4.2 4.6 4.3 4.5  CL 109 106 105 107 110 108  CO2 20* 19* 18* 13* 13* 15*  GLUCOSE 159* 83 133* 313* 305* 319*  BUN 32* 35* 38* 48* 50* 56*  CREATININE 2.03* 2.34* 2.66* 3.27* 3.48* 3.90*  CALCIUM 8.4* 8.6* 8.4* 8.0* 8.3* 8.0*  MG 2.1  --   --   --  2.0  --    GFR: Estimated Creatinine Clearance: 15.5 mL/min (A) (by C-G formula based on SCr of 3.9 mg/dL (H)). Liver Function Tests: Recent Labs  Lab 06/04/2019 0337  AST 27  ALT 30  ALKPHOS 66  BILITOT 1.5*  PROT 6.9  ALBUMIN 3.4*   Recent Labs  Lab 06/01/2019 0337  LIPASE 34   No results for input(s): AMMONIA in the last 168 hours. Coagulation Profile: No results for input(s): INR, PROTIME in the last 168 hours. Cardiac Enzymes: Recent Labs  Lab 05/26/2019 0535  CKTOTAL 89   BNP (last 3 results) No results for input(s): PROBNP in the last 8760 hours. HbA1C: Recent Labs    06/21/2019 0337  HGBA1C 9.1*   CBG: Recent Labs  Lab 06/10/19 2114 06/10/19 2340 06/11/19 0452 06/11/19 0547 06/11/19 0650  GLUCAP 276* 294* 313* 289* 307*   Lipid Profile: Recent Labs    06/11/19 0043  TRIG 65   Thyroid Function Tests: Recent Labs     06/10/19 1403  TSH 0.804  FREET4 1.03   Anemia Panel: No results for input(s): VITAMINB12, FOLATE, FERRITIN, TIBC, IRON, RETICCTPCT in the last 72 hours. Sepsis Labs: Recent Labs  Lab 06/12/2019 8338 06/08/2019 0535  LATICACIDVEN 1.4 1.9    Recent Results (from the past 240 hour(s))  Culture, blood (routine x 2)     Status: None (Preliminary result)   Collection Time: 06/03/2019  3:39 AM   Specimen: BLOOD LEFT HAND  Result Value Ref Range Status   Specimen Description BLOOD LEFT HAND  Final   Special Requests   Final  BOTTLES DRAWN AEROBIC AND ANAEROBIC Blood Culture results may not be optimal due to an inadequate volume of blood received in culture bottles   Culture   Final    NO GROWTH 1 DAY Performed at [facility], [address], [place], [place] [address]    Report Status PENDING  Incomplete  Culture, blood (routine x 2)     Status: None (Preliminary result)   Collection Time: 05/31/2019  3:39 AM   Specimen: BLOOD  Result Value Ref Range Status   Specimen Description BLOOD RIGHT ANTECUBITAL  Final   Special Requests   Final    BOTTLES DRAWN AEROBIC AND ANAEROBIC Blood Culture adequate volume   Culture   Final    NO GROWTH 1 DAY Performed at [facility], [address], [place], [place] [address]    Report Status PENDING  Incomplete  SARS CORONAVIRUS 2 (TAT 6-24 HRS) Nasopharyngeal Nasopharyngeal Swab     Status: None   Collection Time: 05/27/2019  6:48 AM   Specimen: Nasopharyngeal Swab  Result Value Ref Range Status   SARS Coronavirus 2 NEGATIVE NEGATIVE Final    Comment: (NOTE) SARS-CoV-2 target nucleic acids are NOT DETECTED. The SARS-CoV-2 RNA is generally detectable in upper and lower respiratory specimens during the acute phase of infection. Negative results do not preclude SARS-CoV-2 infection, do not rule out co-infections with other pathogens, and should not be used as the sole basis for treatment or other patient management decisions. Negative results must  be combined with clinical observations, patient history, and epidemiological information. The expected result is Negative. Fact Sheet for Patients: SugarRoll.be Fact Sheet for Healthcare Providers: https://www.woods-mathews.com/ This test is not yet approved or cleared by the [place] FDA and  has been authorized for detection and/or diagnosis of SARS-CoV-2 by FDA under an Emergency Use Authorization (EUA). This EUA will remain  in effect (meaning this test can be used) for the duration of the COVID-19 declaration under Section 56 4(b)(1) of the Act, 21 U.S.C. section 360bbb-3(b)(1), unless the authorization is terminated or revoked sooner. Performed at [facility], [place] [address] [place], [place] [address]   MRSA PCR Screening     Status: None   Collection Time: 06/07/2019  8:25 PM   Specimen: Nasal Mucosa; Nasopharyngeal  Result Value Ref Range Status   MRSA by PCR NEGATIVE NEGATIVE Final    Comment:        The GeneXpert MRSA Assay (FDA approved for NASAL specimens only), is one component of a comprehensive MRSA colonization surveillance program. It is not intended to diagnose MRSA infection nor to guide or monitor treatment for MRSA infections. Performed at [facility], [address] [place], [place] [address]   Culture, blood (routine x 2)     Status: None (Preliminary result)   Collection Time: 06/11/19 12:34 AM   Specimen: BLOOD LEFT WRIST  Result Value Ref Range Status   Specimen Description BLOOD LEFT WRIST  Final   Special Requests   Final    BOTTLES DRAWN AEROBIC AND ANAEROBIC Blood Culture results may not be optimal due to an inadequate volume of blood received in culture bottles Performed at [facility], [address] [place],  [address]    Culture PENDING  Incomplete   Report Status PENDING  Incomplete  Culture, blood (routine x 2)     Status: None (Preliminary result)   Collection Time: 06/11/19  12:43 AM   Specimen: BLOOD RIGHT HAND  Result Value Ref Range Status  Specimen Description BLOOD RIGHT HAND  Final   Special Requests   Final    BOTTLES DRAWN AEROBIC ONLY Blood Culture results may not be optimal due to an inadequate volume of blood received in culture bottles Performed at [facility], [address], [place], [place] [address]    Culture PENDING  Incomplete   Report Status PENDING  Incomplete         Radiology Studies: Ct Abdomen Pelvis Wo Contrast  Result Date: 06/11/2019 CLINICAL DATA:  73 year old male with abdominal pain and distension. Recently intubated. EXAM: CT ABDOMEN AND PELVIS WITHOUT CONTRAST TECHNIQUE: Multidetector CT imaging of the abdomen and pelvis was performed following the standard protocol without IV contrast. COMPARISON:  CT Abdomen and Pelvis 06/11/2019. FINDINGS: Lower chest: Increasing layering pleural effusions and confluent new left lower lobe consolidation with air bronchograms. Right lower lobe atelectasis. Middle lobes remain clear. No cardiomegaly or pericardial effusion. Hepatobiliary: Negative noncontrast liver and gallbladder. Pancreas: Negative noncontrast pancreas. Spleen: Negative aside from a small volume of adjacent free fluid. Adrenals/Urinary Tract: Stable since 2019 adrenal gland thickening such as due to adrenal hyperplasia or benign adenomas. Nonobstructed kidneys. Increased bilateral perinephric soft tissue stranding since 2019 but stable recently. Proximal ureters appear decompressed. No nephrolithiasis. Urinary bladder decompressed by a Foley catheter now. Stomach/Bowel: Enteric tube now in place terminating in the gastric fundus. But there remains moderate gaseous distension of the stomach. The proximal duodenum is also dilated as before. There is oral contrast now in dilated small bowel loops in the left and central abdomen which measure up to 31 millimeters diameter. A gradual transition occurs to decompressed small bowel loops  in the pelvis, with the most abrupt transition point identified on coronal image 42 corresponding to series 2, image 71 where gas-filled loops measuring 2 centimeters transition to fully decompressed distal small bowel loops which are just upstream of the terminal ileum. Stool-filled rectum. Sigmoid colon is redundant and contains gas and stool. Similar gas and stool at redundant splenic flexure. Retained low-density stool in the transverse colon. The hepatic flexure is also redundant. Less stool in the right colon. Decompressed cecum and terminal ileum on series 2, image 69. No pneumoperitoneum. Small volume of small bowel mesentery free fluid in the abdomen. Vascular/Lymphatic: Extensive Aortoiliac calcified atherosclerosis. Vascular patency is not evaluated in the absence of IV contrast. Reproductive: Urethral catheter now in place. Other: New or increased moderate volume fluid in the pelvis appears to be free fluid with simple fluid density (series 2, image 77). This is adjacent to the stool-filled rectum, sigmoid colon, and some nondilated small bowel loops. Musculoskeletal: No acute osseous abnormality identified. IMPRESSION: 1. Small Bowel Obstruction with a transition point in the right pelvis on coronal image 42. This may be due to adhesions. Progressive small bowel bowel dilatation since the CT on 06/11/2019 despite the presence of an NG tube terminating in the stomach. No free air.  Increasing free fluid, now moderate in the pelvis. 2. Increasing layering pleural effusions and new left lower lobe consolidation suspicious for Pneumonia. 3. Aortic Atherosclerosis (ICD10-I70.0). Electronically Signed   By: Genevie Ann M.D.   On: 06/11/2019 00:31   Ct Abdomen Pelvis Wo Contrast  Result Date: 06/06/2019 CLINICAL DATA:  Abdominal pain, fever EXAM: CT ABDOMEN AND PELVIS WITHOUT CONTRAST TECHNIQUE: Multidetector CT imaging of the abdomen and pelvis was performed following the standard protocol without IV  contrast. COMPARISON:  09/05/2017 FINDINGS: Lower chest: Moderate left pleural effusion and small right pleural effusion. Compressive atelectasis  in the lower lobes. Heart is normal size. Densely calcified coronary arteries and aorta. Hepatobiliary: No focal hepatic abnormality. Gallbladder unremarkable. Pancreas: No focal abnormality or ductal dilatation. Spleen: No focal abnormality.  Normal size. Adrenals/Urinary Tract: Left adrenal nodules are low-density and similar to prior study, likely adenomas. No visible renal mass. No hydronephrosis. No renal or ureteral stones. Urinary bladder unremarkable. Stomach/Bowel: Large stool burden throughout the colon. There are mildly prominent and dilated small bowel loops into the pelvis. Distal small bowel loops appear decompressed and grossly unremarkable. Findings are concerning for partial small bowel obstruction. Vascular/Lymphatic: Heavily calcified aorta and iliac vessels. No aneurysm or adenopathy. Reproductive: Mildly prominent prostate. Other: No free fluid or free air. Musculoskeletal: No acute bony abnormality. IMPRESSION: Mildly dilated small bowel loops with distal small bowel loops decompressed. Findings are concerning for partial small bowel obstruction. Exact transition or cause not visualized. Large stool burden throughout the colon. Moderate left pleural effusion and small right pleural effusion. Bibasilar atelectasis. Left adrenal nodules again noted, similar to prior study, likely adenomas. Heavily calcified aorta and iliac vessels. Electronically Signed   By: Rolm Baptise M.D.   On: 06/18/2019 21:43   Ct Chest Wo Contrast  Result Date: 06/04/2019 CLINICAL DATA:  Left-sided chest pain. Shortness of breath. EXAM: CT CHEST WITHOUT CONTRAST TECHNIQUE: Multidetector CT imaging of the chest was performed following the standard protocol without IV contrast. COMPARISON:  Chest x-ray dated 05/30/2019 and CT scan of the abdomen dated 09/05/2017 FINDINGS:  Cardiovascular: Aortic atherosclerosis. Coronary artery calcifications. Heart size is normal. No pericardial effusion. Mediastinum/Nodes: No enlarged mediastinal or axillary lymph nodes. Thyroid gland, trachea, and esophagus demonstrate no significant findings. Lungs/Pleura: There are no infiltrates. There is a small left pleural effusion. Tiny right effusion. Minimal secondary atelectasis at the lung bases. The lungs are otherwise clear. Upper Abdomen: There is chronic bilateral adrenal enlargement. Tiny amount of ascites in the left upper quadrant. Musculoskeletal: No chest wall mass or suspicious bone lesions identified. IMPRESSION: 1. Small left and tiny right pleural effusions with secondary atelectasis at the lung bases. 2. Chronic bilateral adrenal enlargement. 3. Tiny amount of ascites in the left upper quadrant. 4. Aortic Atherosclerosis (ICD10-I70.0). Electronically Signed   By: Lorriane Shire M.D.   On: 06/10/2019 12:14   Nm Pulmonary Perfusion  Result Date: 06/12/2019 CLINICAL DATA:  Chest pain and shortness of breath EXAM: NUCLEAR MEDICINE PERFUSION LUNG SCAN TECHNIQUE: Perfusion images were obtained in multiple projections after intravenous injection of radiopharmaceutical. Ventilation scans intentionally deferred if perfusion scan and chest x-ray adequate for interpretation during COVID 19 epidemic. Views: Anterior, posterior, left lateral, right lateral, RPO, LPO, RAO, LAO RADIOPHARMACEUTICALS:  1.5 mCi Tc-3mMAA IV COMPARISON:  Chest radiograph and chest CT June 09, 2019 FINDINGS: Radiotracer uptake is homogeneous and symmetric bilaterally. No perfusion defects evident. IMPRESSION: No appreciable perfusion defects. Very low probability of pulmonary embolus. Electronically Signed   By: WLowella GripIII M.D.   On: 05/28/2019 15:21   Dg Chest Port 1 View  Result Date: 06/10/2019 CLINICAL DATA:  Dyspnea. Shortness of breath. EXAM: PORTABLE CHEST 1 VIEW COMPARISON:  06/02/2019  FINDINGS: There is a small left pleural effusion. The lungs are otherwise clear. Heart size and vascularity are normal. Aortic atherosclerosis. No acute bone abnormality. IMPRESSION: 1. Small left pleural effusion. 2.  Aortic Atherosclerosis (ICD10-I70.0). 3. No other significant abnormalities. Electronically Signed   By: JLorriane ShireM.D.   On: 06/10/2019 21:12   Dg Chest Port 1v Same Day  Result Date: 06/10/2019 CLINICAL DATA:  Post intubation EXAM: PORTABLE CHEST 1 VIEW COMPARISON:  06/10/2019 FINDINGS: Interval placement of endotracheal tube with the tip 7 cm above the carina. NG tube tip is in the proximal stomach with the side port in the distal esophagus. Continued layering left effusion with left base atelectasis. Right lung clear. Heart is normal size. No acute bony abnormality. IMPRESSION: Endotracheal tube 7 cm above the carina. Continued layering left effusion with left base atelectasis. Electronically Signed   By: Rolm Baptise M.D.   On: 06/10/2019 23:06        Scheduled Meds:  aspirin EC  81 mg Oral Daily   chlorhexidine gluconate (MEDLINE KIT)  15 mL Mouth Rinse BID   Chlorhexidine Gluconate Cloth  6 each Topical Daily   feeding supplement (GLUCERNA SHAKE)  237 mL Oral TID BM   feeding supplement (PRO-STAT SUGAR FREE 64)  30 mL Oral BID   heparin  5,000 Units Subcutaneous Q8H   multivitamin with minerals  1 tablet Oral Daily   succinylcholine  100 mg Intravenous Once   Continuous Infusions:  sodium chloride 150 mL/hr at 06/11/19 0303   sodium chloride 75 mL/hr at 06/11/19 0542   ceFEPime (MAXIPIME) IV 1 g (06/11/19 0157)   dextrose 5 % and 0.45% NaCl     fentaNYL infusion INTRAVENOUS 25 mcg/hr (06/11/19 0554)   insulin 7.5 Units/hr (06/11/19 0609)   potassium chloride     propofol (DIPRIVAN) infusion 20 mcg/kg/min (06/11/19 0451)   vancomycin       LOS: 1 day    Critical care time: 56 minutes    Merin Borjon Darleen Crocker, DO [facility] Pager  [phone]  If 7PM-7AM, please contact night-coverage [email or web address] Password [email or web address] 06/11/2019, 7:18 AM

## 2019-06-11 NOTE — Progress Notes (Signed)
Upon assessment of patient, HR increased to 150s and RR 40s. Spoke with wife and patient about code status and Intubation. Pt not getting any better, RR-50s. After multiple attemps at the hospitalist, Segundo pagedl. Pt intubated per MD. 7.5 ETT, CT scan ordered. Will  continue to monitor pt

## 2019-06-11 NOTE — Progress Notes (Signed)
Chaplain on call present supporting family at this time.

## 2019-06-11 NOTE — Progress Notes (Signed)
DR. Maudie Mercury paged and made aware of HR sustaining in 140s- waiting for call back/ordres.

## 2019-06-11 NOTE — Progress Notes (Signed)
eLink Physician-Brief Progress Note Patient Name: [name] [name] DOB: 1945/10/21 MRN: [id number]   Date of Service  06/11/2019  HPI/Events of Note  Inadequate sedation with Precedex  eICU Interventions  Precedex discontinued, Propofol infusion ordered along with PRN Fentanyl.        [name] 06/11/2019, 2:42 AM

## 2019-06-11 NOTE — Progress Notes (Signed)
Inpatient Diabetes Program Recommendations  AACE/ADA: New Consensus Statement on Inpatient Glycemic Control (2015)  Target Ranges:  Prepandial:   less than 140 mg/dL      Peak postprandial:   less than 180 mg/dL (1-2 hours)      Critically ill patients:  140 - 180 mg/dL   Lab Results  Component Value Date   GLUCAP 195 (H) 06/11/2019   HGBA1C 9.1 (H) 06/06/2019    Review of Glycemic Control Results for JAFETH, MUSTIN (MRN [id number]) as of 06/11/2019 10:01  Ref. Range 06/11/2019 07:40 06/11/2019 08:55 06/11/2019 09:59  Glucose-Capillary Latest Ref Range: 70 - 99 mg/dL 240 (H) 210 (H) 195 (H)   Admit with: DKA (possibly due to empagliflozin)/ SIRS  History: DM, Cardiomyopathy  Home DM Meds: Glyxambi 10/5 mg Daily                             Glipizide 10 mg daily                             Metformin 1000 mg BID  Current Orders:: IV insulin  Inpatient Diabetes Program Recommendations:    Noted patient restarted on EndoTool and BMET results. Of note, patient was transitioned off of insulin drip without receiving basal insulin and waiting for transition options to be unlocked per software. Additionally, patient did not receive any correction.   Recommend leaving patient on Endotool until transition options unlocked, then follow Hyperglycemic crises transition order set.  -Ensure patient receives basal insulin 2 hours prior to discontinuing IV insulin.   Thanks, Bronson Curb, MSN, RNC-[facility] Diabetes Coordinator [phone] (8a-5p)

## 2019-06-11 NOTE — Progress Notes (Signed)
CRITICAL VALUE ALERT  Critical Value:  Aerobic blood culture bottle Gram positive Cocci  Date & Time Notied:  06/11/2019 1831  Provider Notified: Manuella Ghazi  Orders Received/Actions taken: paged, no new orders at this time.

## 2019-06-11 NOTE — Consult Note (Signed)
[facility] Renal Consultation Note  Requesting MD:  Indication for Consultation: Acute kidney injury.  Assessment and management of electrolyte and acid-base disorders, management and treatment of anemia, assessment and treatment of metabolic bone disease, maintenance of euvolemia.  HPI:  [name] [name] is a 73 y.o. male.  History of diabetes hypertension hypertrophic cardiomyopathy.  He has a history of coronary artery disease status post PCI LAD April 2009.  Last 2D echo in 2015 showed an ejection fraction of 65% with septal basal hypertrophy no S.A.M. near mid cavitary obstruction.  He was brought to the emergency room 2019/06/28 with fever tachycardia respiratory distress.  Evaluation in the emergency room with chest x-ray CT scan of chest did not show any acute findings no evidence for pneumonia VQ scan low probability of pulmonary embolus.  Found to be febrile.  He was also found to have a metabolic acidosis secondary diabetic ketoacidosis and was started on empiric antibiotics.  Home medications amlodipine 5 mg daily aspirin 81 mg daily, Glyxambi 1 tablet twice daily, Monopril 40 mg daily, glipizide 1 tablet daily, Metformin 100 mg twice daily, Protonix 40 mg nightly, Crestor 10 mg daily, iron sulfate 325 mg daily  Blood pressure 100/68 pulse 138 temperature 102 O2 sats 100% intubated on the ventilator FiO2 100%  Sodium 138 potassium 4.2 chloride 108 CO2 13 glucose 221 BUN 60 creatinine 4.31 calcium 8.4 lactic acid 4.5 INR 1.5.  WBC 9.5 hemoglobin 14.2 platelets 259  IV cefepime 1 g every 24 hours IV vancomycin 500 mg administered every 48 hours   received a bolus dose of 1 g 28-Jun-2019 IV Zosyn 3.375 g every 8 hours 06/10/2019 DC'd IV sodium chloride 150 cc an hour IV D5 half-normal saline 75 cc an hour IV lactated Ringer's bolus 2 L 06/11/2019 administered 10:58 AM IV potassium chloride 10 mEq 05/09/2019 and 05/10/2019  Chest x-ray small left pleural effusion left lung  base patchy opacity. Abdominal CT small bowel obstruction with a transition point of the right pelvis increasing pleural effusion left lower lobe with consolidation aortic atherosclerosis VQ scan negative for mismatch   Urine output 1 L 06/10/2019.  Aspirin 81 mg daily, Lopressor IV 5 mg administered 1:31 AM 06/11/2019  Blood cultures negative MRSA PCR negative    Creatinine, Ser  Date/Time Value Ref Range Status  06/11/2019 09:33 AM 4.31 (H) 0.61 - 1.24 mg/dL Final  16/04/9603 54:09 AM 3.90 (H) 0.61 - 1.24 mg/dL Final  81/19/1478 29:56 AM 3.48 (H) 0.61 - 1.24 mg/dL Final  21/30/8657 84:69 PM 3.27 (H) 0.61 - 1.24 mg/dL Final  62/95/2841 32:44 PM 2.66 (H) 0.61 - 1.24 mg/dL Final  07/25/7251 66:44 AM 2.34 (H) 0.61 - 1.24 mg/dL Final  03/47/4259 56:38 AM 2.03 (H) 0.61 - 1.24 mg/dL Final  75/64/3329 51:88 AM 1.94 (H) 0.61 - 1.24 mg/dL Final  41/66/0630 16:01 AM 1.74 (H) 0.61 - 1.24 mg/dL Final  09/32/3557 32:20 AM 1.60 (H) 0.61 - 1.24 mg/dL Final  25/42/7062 37:62 AM 0.82 0.61 - 1.24 mg/dL Final  83/15/1761 60:73 AM 0.99 0.61 - 1.24 mg/dL Final  71/12/2692 85:46 AM 0.91 0.61 - 1.24 mg/dL Final  27/09/5007 38:18 AM 0.89 0.61 - 1.24 mg/dL Final  29/93/7169 67:89 AM 1.01 0.61 - 1.24 mg/dL Final  38/04/1750 02:58 PM 1.03 0.50 - 1.35 mg/dL Final  52/77/8242 35:36 PM 0.98 0.50 - 1.35 mg/dL Final  14/43/1540 08:67 PM 0.72 0.50 - 1.35 mg/dL Final  61/95/0932 67:12 AM 0.78 0.40 - 1.50 mg/dL Final  45/80/9983 38:25  PM 0.86 0.40 - 1.50 mg/dL Final  16/04/9603 54:09 AM 0.78 0.40 - 1.50 mg/dL Final  81/19/1478 29:56 AM 0.81 0.4 - 1.5 mg/dL Final  21/30/8657 84:69 AM 0.84 0.4 - 1.5 mg/dL Final  62/95/2841 32:44 AM 0.87  Final  12/30/2007 09:20 PM 0.85 0.40 - 1.50 mg/dL Final  07/25/7251 66:44 AM 0.82  Final  07/29/2007 12:30 PM 0.98  Final  06/17/2007 01:08 AM 0.84 0.40 - 1.50 mg/dL Final  03/47/4259 56:38 PM 0.83 0.40 - 1.50 mg/dL Final  75/64/3329 51:88 PM 0.87 0.40 - 1.50 mg/dL Final   41/66/0630 16:01 PM 0.86 0.40 - 1.50 mg/dL Final     PMHx:   Past Medical History:  Diagnosis Date  . Bleeding acute gastric ulcer 08/2017  . Coronary artery disease    a. 10/2007 s/p PCI of LAD (3.5x23 Vision BMS); b. 05/2008 Cath/PCI: LM nl, LAD 49m, 80 ISR (3.5x28 Promus DES), LCX min irregs, RI nl;  c. 12/2009 MV: EF 61%, no ischemia.  . Diabetes mellitus   . Dyslipidemia   . Hypertension   . Hypertrophic cardiomyopathy (HCC)    a. 03/2014 Echo: EF 60-65%, Gr 1 DD, sev septal basal hypertrophy, no SAM but near mid-cavitary obstruction, mild MR.    Past Surgical History:  Procedure Laterality Date  . BACK SURGERY  1992  . CORONARY STENT PLACEMENT  06/03/2008   Promus stent to mid LAD; 10/23/2007 Vision stent to mid LAD  . ESOPHAGOGASTRODUODENOSCOPY N/A 09/06/2017   Procedure: ESOPHAGOGASTRODUODENOSCOPY (EGD);  Surgeon: Iva Boop, MD;  Location: [facility] ENDOSCOPY;  Service: Endoscopy;  Laterality: N/A;  . FEMORAL ENDARTERECTOMY  2010   right, with bovine patch angioplasty;  post cath, r./t aneursym/claudication  . INGUINAL HERNIA REPAIR Left 08/06/2013   Procedure: HERNIA REPAIR INGUINAL ADULT;  Surgeon: [name], MD;  Location: [facility] ORS;  Service: General;  Laterality: Left;  . TRANSTHORACIC ECHOCARDIOGRAM  01/12/2010   EF>55%; mild concentric LVH; mild MR, mild TR    Family Hx:  Family History  Problem Relation Age of Onset  . Kidney disease Mother   . Heart Problems Mother   . Colon cancer Brother        >60, deceased from colon ca    Social History:  reports that he has never smoked. He has never used smokeless tobacco. He reports that he does not drink alcohol or use drugs.  Allergies: No Known Allergies  Medications: Prior to Admission medications   Medication Sig Start Date End Date Taking? Authorizing Provider  amLODipine (NORVASC) 5 MG tablet TAKE ONE (1) TABLET EACH DAY 05/22/19  Yes Hilty, Lisette Abu, MD  aspirin EC 81 MG tablet Take 81 mg by mouth  daily.   Yes [provider]  Empagliflozin-linaGLIPtin (GLYXAMBI) 10-5 MG TABS Take 1 tablet by mouth daily. 09/30/18  Yes [provider]  ferrous sulfate 325 (65 FE) MG tablet Take 1 tablet (325 mg total) by mouth 2 (two) times daily with a meal. 09/06/17  Yes Berton Mount I, MD  fosinopril (MONOPRIL) 40 MG tablet TAKE ONE (1) TABLET EACH DAY 03/31/18  Yes Hilty, Lisette Abu, MD  glipiZIDE (GLUCOTROL) 10 MG tablet Take 1 tablet by mouth daily. 07/05/17  Yes [provider]  metFORMIN (GLUCOPHAGE) 1000 MG tablet TAKE ONE TABLET BY MOUTH TWICE DAILY 11/25/18  Yes Hilty, Lisette Abu, MD  Multiple Vitamins-Minerals (MULTIVITAMIN PO) Take by mouth daily.   Yes [provider]  pantoprazole (PROTONIX) 40 MG tablet Take 1 tablet (40  mg total) by mouth daily before breakfast. 10/31/17  Yes Iva Boop, MD  rosuvastatin (CRESTOR) 10 MG tablet Take 10 mg by mouth daily.   Yes [provider]     Labs:  Results for orders placed or performed during the hospital encounter of 06/14/2019 (from the past 48 hour(s))  Blood gas, arterial     Status: Abnormal   Collection Time: 05/28/2019  1:11 PM  Result Value Ref Range   FIO2 21.00    pH, Arterial 7.364 7.350 - 7.450   pCO2 arterial 22.1 (L) 32.0 - 48.0 mmHg   pO2, Arterial 78.2 (L) 83.0 - 108.0 mmHg   Bicarbonate 15.6 (L) 20.0 - 28.0 mmol/L   Acid-base deficit 12.1 (H) 0.0 - 2.0 mmol/L   O2 Saturation 94.6 %   Patient temperature 37.7    Allens test (pass/fail) PASS PASS    Comment: Performed at [facility], [address] [place], [place] [address]  Salicylate level     Status: None   Collection Time: 06/07/2019  2:23 PM  Result Value Ref Range   Salicylate Lvl <7.0 2.8 - 30.0 mg/dL    Comment: Performed at [facility], [address], [place], [place] [address]  Ethanol     Status: None   Collection Time: 06/11/2019  2:23 PM  Result Value Ref Range   Alcohol, Ethyl (B) <10 <10 mg/dL    Comment:  (NOTE) Lowest detectable limit for serum alcohol is 10 mg/dL. For medical purposes only. Performed at [facility], [address], [place], [place] [address]   Beta-hydroxybutyric acid     Status: Abnormal   Collection Time: 05/26/2019  2:23 PM  Result Value Ref Range   Beta-Hydroxybutyric Acid 3.04 (H) 0.05 - 0.27 mmol/L    Comment: Performed at [facility], [address] [place], [place] [address]  CBG monitoring, ED     Status: Abnormal   Collection Time: 06/06/2019  6:46 PM  Result Value Ref Range   Glucose-Capillary 304 (H) 70 - 99 mg/dL  CBG monitoring, ED     Status: Abnormal   Collection Time: 05/28/2019  8:02 PM  Result Value Ref Range   Glucose-Capillary 309 (H) 70 - 99 mg/dL   Comment 1 Notify RN    Comment 2 Document in Chart   MRSA PCR Screening     Status: None   Collection Time: 05/29/2019  8:25 PM   Specimen: Nasal Mucosa; Nasopharyngeal  Result Value Ref Range   MRSA by PCR NEGATIVE NEGATIVE    Comment:        The GeneXpert MRSA Assay (FDA approved for NASAL specimens only), is one component of a comprehensive MRSA colonization surveillance program. It is not intended to diagnose MRSA infection nor to guide or monitor treatment for MRSA infections. Performed at [facility], [address], [place], [place] [address]   Glucose, capillary     Status: Abnormal   Collection Time: 06/01/2019  9:30 PM  Result Value Ref Range   Glucose-Capillary 297 (H) 70 - 99 mg/dL   Comment 1 Notify RN    Comment 2 Document in Chart   Glucose, capillary     Status: Abnormal   Collection Time: 06/14/2019 10:32 PM  Result Value Ref Range   Glucose-Capillary 267 (H) 70 - 99 mg/dL  Glucose, capillary     Status: Abnormal   Collection Time: 06/21/2019 11:27 PM  Result Value Ref Range   Glucose-Capillary 205 (H) 70 - 99 mg/dL  Glucose,  capillary     Status: Abnormal   Collection Time: 06/10/19 12:26 AM  Result Value Ref Range   Glucose-Capillary 183 (H) 70 - 99 mg/dL   Comment 1  Notify RN    Comment 2 Document in Chart   Beta-hydroxybutyric acid     Status: None   Collection Time: 06/10/19 12:45 AM  Result Value Ref Range   Beta-Hydroxybutyric Acid 0.18 0.05 - 0.27 mmol/L    Comment: Performed at [facility], [address] [place], [place] [address]  Basic metabolic panel     Status: Abnormal   Collection Time: 06/10/19 12:45 AM  Result Value Ref Range   Sodium 137 135 - 145 mmol/L   Potassium 3.1 (L) 3.5 - 5.1 mmol/L    Comment: DELTA CHECK NOTED   Chloride 107 98 - 111 mmol/L   CO2 19 (L) 22 - 32 mmol/L   Glucose, Bld 200 (H) 70 - 99 mg/dL   BUN 30 (H) 8 - 23 mg/dL   Creatinine, Ser 6.04 (H) 0.61 - 1.24 mg/dL   Calcium 8.2 (L) 8.9 - 10.3 mg/dL   GFR calc non Af Amer 33 (L) >60 mL/min   GFR calc Af Amer 38 (L) >60 mL/min   Anion gap 11 5 - 15    Comment: Performed at [facility], [address] [place], [place] [address]  Glucose, capillary     Status: Abnormal   Collection Time: 06/10/19  1:22 AM  Result Value Ref Range   Glucose-Capillary 210 (H) 70 - 99 mg/dL   Comment 1 Notify RN    Comment 2 Document in Chart   Glucose, capillary     Status: Abnormal   Collection Time: 06/10/19  2:26 AM  Result Value Ref Range   Glucose-Capillary 175 (H) 70 - 99 mg/dL   Comment 1 Notify RN    Comment 2 Document in Chart   Glucose, capillary     Status: Abnormal   Collection Time: 06/10/19  3:30 AM  Result Value Ref Range   Glucose-Capillary 161 (H) 70 - 99 mg/dL   Comment 1 Notify RN    Comment 2 Document in Chart   Basic metabolic panel     Status: Abnormal   Collection Time: 06/10/19  4:25 AM  Result Value Ref Range   Sodium 140 135 - 145 mmol/L   Potassium 3.2 (L) 3.5 - 5.1 mmol/L   Chloride 109 98 - 111 mmol/L   CO2 20 (L) 22 - 32 mmol/L   Glucose, Bld 159 (H) 70 - 99 mg/dL   BUN 32 (H) 8 - 23 mg/dL   Creatinine, Ser 1.19 (H) 0.61 - 1.24 mg/dL   Calcium 8.4 (L) 8.9 - 10.3 mg/dL   GFR calc non Af Amer 31 (L) >60 mL/min   GFR calc Af Amer 36  (L) >60 mL/min   Anion gap 11 5 - 15    Comment: Performed at [facility], [address] [place], [place] [address]  Magnesium     Status: None   Collection Time: 06/10/19  4:25 AM  Result Value Ref Range   Magnesium 2.1 1.7 - 2.4 mg/dL    Comment: Performed at [facility], [address] [place], [place] [address]  Glucose, capillary     Status: Abnormal   Collection Time: 06/10/19  4:26 AM  Result Value Ref Range   Glucose-Capillary 134 (H) 70 - 99 mg/dL   Comment 1 Notify RN    Comment 2 Document in  Chart   Glucose, capillary     Status: Abnormal   Collection Time: 06/10/19  5:30 AM  Result Value Ref Range   Glucose-Capillary 135 (H) 70 - 99 mg/dL   Comment 1 Notify RN    Comment 2 Document in Chart   Glucose, capillary     Status: Abnormal   Collection Time: 06/10/19  6:30 AM  Result Value Ref Range   Glucose-Capillary 166 (H) 70 - 99 mg/dL   Comment 1 Notify RN    Comment 2 Document in Chart   Glucose, capillary     Status: Abnormal   Collection Time: 06/10/19  8:28 AM  Result Value Ref Range   Glucose-Capillary 105 (H) 70 - 99 mg/dL  Basic metabolic panel     Status: Abnormal   Collection Time: 06/10/19  8:52 AM  Result Value Ref Range   Sodium 140 135 - 145 mmol/L   Potassium 4.4 3.5 - 5.1 mmol/L    Comment: DELTA CHECK NOTED   Chloride 106 98 - 111 mmol/L   CO2 19 (L) 22 - 32 mmol/L   Glucose, Bld 83 70 - 99 mg/dL   BUN 35 (H) 8 - 23 mg/dL   Creatinine, Ser 2.84 (H) 0.61 - 1.24 mg/dL   Calcium 8.6 (L) 8.9 - 10.3 mg/dL   GFR calc non Af Amer 26 (L) >60 mL/min   GFR calc Af Amer 31 (L) >60 mL/min   Anion gap 15 5 - 15    Comment: Performed at [facility], [address], [place], [place] [address]  Beta-hydroxybutyric acid     Status: None   Collection Time: 06/10/19  8:52 AM  Result Value Ref Range   Beta-Hydroxybutyric Acid 0.11 0.05 - 0.27 mmol/L    Comment: Performed at [facility], [address] [place], [place] [address]  Glucose, capillary      Status: Abnormal   Collection Time: 06/10/19 11:35 AM  Result Value Ref Range   Glucose-Capillary 112 (H) 70 - 99 mg/dL  Basic metabolic panel     Status: Abnormal   Collection Time: 06/10/19 12:38 PM  Result Value Ref Range   Sodium 139 135 - 145 mmol/L   Potassium 4.2 3.5 - 5.1 mmol/L   Chloride 105 98 - 111 mmol/L   CO2 18 (L) 22 - 32 mmol/L   Glucose, Bld 133 (H) 70 - 99 mg/dL   BUN 38 (H) 8 - 23 mg/dL   Creatinine, Ser 2.53 (H) 0.61 - 1.24 mg/dL   Calcium 8.4 (L) 8.9 - 10.3 mg/dL   GFR calc non Af Amer 23 (L) >60 mL/min   GFR calc Af Amer 26 (L) >60 mL/min   Anion gap 16 (H) 5 - 15    Comment: Performed at [facility], [address], [place], [place] [address]  TSH     Status: None   Collection Time: 06/10/19  2:03 PM  Result Value Ref Range   TSH 0.804 0.350 - 4.500 uIU/mL    Comment: Performed by a 3rd Generation assay with a functional sensitivity of <=0.01 uIU/mL. Performed at [facility], [address] [place], [place] [address]   T4, free     Status: None   Collection Time: 06/10/19  2:03 PM  Result Value Ref Range   Free T4 1.03 0.61 - 1.12 ng/dL    Comment: (NOTE) Biotin ingestion may interfere with free T4 tests. If the results are inconsistent with the TSH level, previous test results, or the  clinical presentation, then consider biotin interference. If needed, order repeat testing after stopping biotin. Performed at [facility], [address], [place], [place] [address]   Sodium, urine, random     Status: None   Collection Time: 06/10/19  3:00 PM  Result Value Ref Range   Sodium, Ur 31 mmol/L    Comment: Performed at [facility], [address] [place], [place] [address]  Creatinine, urine, random     Status: None   Collection Time: 06/10/19  3:00 PM  Result Value Ref Range   Creatinine, Urine 62.69 mg/dL    Comment: Performed at [facility], [address] [place], [place] [address]  Glucose, capillary     Status: Abnormal   Collection  Time: 06/10/19  5:06 PM  Result Value Ref Range   Glucose-Capillary 237 (H) 70 - 99 mg/dL  Blood gas, arterial     Status: Abnormal   Collection Time: 06/10/19  8:55 PM  Result Value Ref Range   FIO2 30.00    pH, Arterial 7.450 7.350 - 7.450   pCO2 arterial 20.1 (L) 32.0 - 48.0 mmHg   pO2, Arterial 60.4 (L) 83.0 - 108.0 mmHg   Bicarbonate 17.9 (L) 20.0 - 28.0 mmol/L   Acid-base deficit 9.6 (H) 0.0 - 2.0 mmol/L   O2 Saturation 91.2 %   Patient temperature 37.0    Allens test (pass/fail) PASS PASS    Comment: Performed at [facility], [address], [place], [place] [address]  Glucose, capillary     Status: Abnormal   Collection Time: 06/10/19  9:14 PM  Result Value Ref Range   Glucose-Capillary 276 (H) 70 - 99 mg/dL  Basic metabolic panel     Status: Abnormal   Collection Time: 06/10/19  9:27 PM  Result Value Ref Range   Sodium 134 (L) 135 - 145 mmol/L   Potassium 4.6 3.5 - 5.1 mmol/L   Chloride 107 98 - 111 mmol/L   CO2 13 (L) 22 - 32 mmol/L   Glucose, Bld 313 (H) 70 - 99 mg/dL   BUN 48 (H) 8 - 23 mg/dL   Creatinine, Ser 6.22 (H) 0.61 - 1.24 mg/dL   Calcium 8.0 (L) 8.9 - 10.3 mg/dL   GFR calc non Af Amer 18 (L) >60 mL/min   GFR calc Af Amer 20 (L) >60 mL/min   Anion gap 14 5 - 15    Comment: Performed at [facility], [address], [place], [place] [address]  CBC     Status: Abnormal   Collection Time: 06/10/19  9:27 PM  Result Value Ref Range   WBC 7.3 4.0 - 10.5 K/uL   RBC 4.92 4.22 - 5.81 MIL/uL   Hemoglobin 14.0 13.0 - 17.0 g/dL   HCT 92.1 19.4 - 17.4 %   MCV 88.0 80.0 - 100.0 fL   MCH 28.5 26.0 - 34.0 pg   MCHC 32.3 30.0 - 36.0 g/dL   RDW 08.1 44.8 - 18.5 %   Platelets 256 150 - 400 K/uL   nRBC 0.6 (H) 0.0 - 0.2 %    Comment: Performed at [facility], [address], [place], [place] [address]  Troponin I (High Sensitivity)     Status: Abnormal   Collection Time: 06/10/19  9:27 PM  Result Value Ref Range   Troponin I (High Sensitivity) 41 (H) <18 ng/L     Comment: DELTA CHECK NOTED Performed at [facility], [address], [place], [place] [address]   Glucose,  capillary     Status: Abnormal   Collection Time: 06/10/19 11:40 PM  Result Value Ref Range   Glucose-Capillary 294 (H) 70 - 99 mg/dL  Blood gas, arterial     Status: Abnormal   Collection Time: 06/10/19 11:54 PM  Result Value Ref Range   FIO2 100.00    Delivery systems VENTILATOR    pH, Arterial 7.403 7.350 - 7.450   pCO2 arterial 22.3 (L) 32.0 - 48.0 mmHg   pO2, Arterial 136 (H) 83.0 - 108.0 mmHg   Bicarbonate 17.4 (L) 20.0 - 28.0 mmol/L   Acid-base deficit 10.2 (H) 0.0 - 2.0 mmol/L   O2 Saturation 98.3 %   Patient temperature 38.9    Drawn by [id number]    Allens test (pass/fail) PASS PASS    Comment: Performed at [facility], [address] [place], [place] [address]  AMBMP     Status: Abnormal   Collection Time: 06/11/19 12:34 AM  Result Value Ref Range   Sodium 137 135 - 145 mmol/L   Potassium 4.3 3.5 - 5.1 mmol/L   Chloride 110 98 - 111 mmol/L   CO2 13 (L) 22 - 32 mmol/L   Glucose, Bld 305 (H) 70 - 99 mg/dL   BUN 50 (H) 8 - 23 mg/dL   Creatinine, Ser 3.48 (H) 0.61 - 1.24 mg/dL   Calcium 8.3 (L) 8.9 - 10.3 mg/dL   GFR calc non Af Amer 16 (L) >60 mL/min   GFR calc Af Amer 19 (L) >60 mL/min   Anion gap 14 5 - 15    Comment: Performed at [facility], [address], [place], [place] [address]  [facility]     Status: Abnormal   Collection Time: 06/11/19 12:34 AM  Result Value Ref Range   WBC 9.5 4.0 - 10.5 K/uL   RBC 4.95 4.22 - 5.81 MIL/uL   Hemoglobin 14.2 13.0 - 17.0 g/dL   HCT 43.9 39.0 - 52.0 %   MCV 88.7 80.0 - 100.0 fL   MCH 28.7 26.0 - 34.0 pg   MCHC 32.3 30.0 - 36.0 g/dL   RDW 13.3 11.5 - 15.5 %   Platelets 259 150 - 400 K/uL   nRBC 0.6 (H) 0.0 - 0.2 %    Comment: Performed at [facility], [address], [place], [place] [address]  Magnesium     Status: None   Collection Time: 06/11/19 12:34 AM  Result Value Ref Range   Magnesium 2.0 1.7 - 2.4 mg/dL     Comment: Performed at [facility], [address] [place], [place] [address]  Culture, blood (routine x 2)     Status: None (Preliminary result)   Collection Time: 06/11/19 12:34 AM   Specimen: BLOOD LEFT WRIST  Result Value Ref Range   Specimen Description BLOOD LEFT WRIST    Special Requests      BOTTLES DRAWN AEROBIC AND ANAEROBIC Blood Culture results may not be optimal due to an inadequate volume of blood received in culture bottles   Culture      NO GROWTH < 12 HOURS Performed at [facility], [address] [place],  [address]    Report Status PENDING   Culture, blood (routine x 2)     Status: None (Preliminary result)   Collection Time: 06/11/19 12:43 AM   Specimen: BLOOD RIGHT HAND  Result Value Ref Range   Specimen Description BLOOD RIGHT HAND    Special Requests      BOTTLES DRAWN AEROBIC ONLY Blood  Culture results may not be optimal due to an inadequate volume of blood received in culture bottles   Culture      NO GROWTH < 12 HOURS Performed at [facility], [address] [place], [place] [address]    Report Status PENDING   Triglycerides     Status: None   Collection Time: 06/11/19 12:43 AM  Result Value Ref Range   Triglycerides 65 <150 mg/dL    Comment: Performed at [facility], [address] [place], [place] [address]  Glucose, capillary     Status: Abnormal   Collection Time: 06/11/19  4:52 AM  Result Value Ref Range   Glucose-Capillary 313 (H) 70 - 99 mg/dL  Glucose, capillary     Status: Abnormal   Collection Time: 06/11/19  5:47 AM  Result Value Ref Range   Glucose-Capillary 289 (H) 70 - 99 mg/dL  Basic metabolic panel     Status: Abnormal   Collection Time: 06/11/19  6:14 AM  Result Value Ref Range   Sodium 135 135 - 145 mmol/L   Potassium 4.5 3.5 - 5.1 mmol/L   Chloride 108 98 - 111 mmol/L   CO2 15 (L) 22 - 32 mmol/L   Glucose, Bld 319 (H) 70 - 99 mg/dL   BUN 56 (H) 8 - 23 mg/dL   Creatinine, Ser 1.91 (H) 0.61 - 1.24 mg/dL   Calcium 8.0  (L) 8.9 - 10.3 mg/dL   GFR calc non Af Amer 14 (L) >60 mL/min   GFR calc Af Amer 17 (L) >60 mL/min   Anion gap 12 5 - 15    Comment: Performed at [facility], [address], [place], [place] [address]  Beta-hydroxybutyric acid     Status: Abnormal   Collection Time: 06/11/19  6:14 AM  Result Value Ref Range   Beta-Hydroxybutyric Acid 0.77 (H) 0.05 - 0.27 mmol/L    Comment: Performed at [facility], [address], [place], [place] [address]  Glucose, capillary     Status: Abnormal   Collection Time: 06/11/19  6:50 AM  Result Value Ref Range   Glucose-Capillary 307 (H) 70 - 99 mg/dL  Glucose, capillary     Status: Abnormal   Collection Time: 06/11/19  7:40 AM  Result Value Ref Range   Glucose-Capillary 240 (H) 70 - 99 mg/dL  Blood gas, arterial today     Status: Abnormal   Collection Time: 06/11/19  8:50 AM  Result Value Ref Range   FIO2 100.00    pH, Arterial 7.396 7.350 - 7.450   pCO2 arterial 23.0 (L) 32.0 - 48.0 mmHg   pO2, Arterial 102 83.0 - 108.0 mmHg   Bicarbonate 17.2 (L) 20.0 - 28.0 mmol/L   Acid-base deficit 10.2 (H) 0.0 - 2.0 mmol/L   O2 Saturation 97.3 %   Patient temperature 39.2    Allens test (pass/fail) PASS PASS    Comment: Performed at [facility], [address], [place], [place] [address]  Glucose, capillary     Status: Abnormal   Collection Time: 06/11/19  8:55 AM  Result Value Ref Range   Glucose-Capillary 210 (H) 70 - 99 mg/dL  Basic metabolic panel     Status: Abnormal   Collection Time: 06/11/19  9:33 AM  Result Value Ref Range   Sodium 138 135 - 145 mmol/L   Potassium 4.2 3.5 - 5.1 mmol/L   Chloride 108 98 - 111 mmol/L   CO2 13 (L) 22 - 32 mmol/L   Glucose, Bld 221 (  H) 70 - 99 mg/dL   BUN 60 (H) 8 - 23 mg/dL   Creatinine, Ser 1.61 (H) 0.61 - 1.24 mg/dL   Calcium 8.4 (L) 8.9 - 10.3 mg/dL   GFR calc non Af Amer 13 (L) >60 mL/min   GFR calc Af Amer 15 (L) >60 mL/min   Anion gap 17 (H) 5 - 15    Comment: Performed at [facility], [address] [place], [place] [address]  Lactic acid, plasma     Status: Abnormal   Collection Time: 06/11/19  9:33 AM  Result Value Ref Range   Lactic Acid, Venous 4.5 (HH) 0.5 - 1.9 mmol/L    Comment: CRITICAL RESULT CALLED TO, READ BACK BY AND VERIFIED WITH: SHELTON,A AT 10:10AM ON 06/11/19 BY [facility] Performed at [facility], [address] [place], [place] [address]   Protime-INR     Status: Abnormal   Collection Time: 06/11/19  9:33 AM  Result Value Ref Range   Prothrombin Time 17.8 (H) 11.4 - 15.2 seconds   INR 1.5 (H) 0.8 - 1.2    Comment: (NOTE) INR goal varies based on device and disease states. Performed at [facility], [address], [place], [place] [address]   Glucose, capillary     Status: Abnormal   Collection Time: 06/11/19  9:59 AM  Result Value Ref Range   Glucose-Capillary 195 (H) 70 - 99 mg/dL  Glucose, capillary     Status: Abnormal   Collection Time: 06/11/19 10:55 AM  Result Value Ref Range   Glucose-Capillary 157 (H) 70 - 99 mg/dL     ROS: Unable to obtain patient intubated sedated    Physical Exam: Vitals:   06/11/19 0930 06/11/19 1000  BP: 102/76 (!) 136/112  Pulse:  (!) 149  Resp: (!) 28 (!) 27  Temp: (!) 102.4 F (39.1 C) (!) 102.4 F (39.1 C)  SpO2:  100%     General: Sedated on ventilator HEENT: ET tube in place Eyes: Nonicteric equal and reactive pupils Neck: JVP not elevated Heart: No JVP regular rate and rhythm tachycardic no pedal edema Lungs: Mechanically supported breath sounds Abdomen: Soft nontender bowel sounds present hypoactive Extremities: No specific edema Skin: No rash lesions or ulcers Neuro: Sedated on ventilator  Assessment/Plan: 1.Acute kidney injury supposedly baseline serum creatinine within the normal range.  Patient was on ACE inhibitor as outpatient came in with acute kidney injury creatinine increased to 1.6.  Does not appear to have had hemodynamic instability based on record.  Abdominal CT did not reveal any  evidence of hydronephrosis.  Urine did not reveal any evidence of cellular activity.  Fina 0.9%.  No administration of nephrotoxins no IV contrast or intra-arterial procedures.  Appears that this is most likely secondary to acute tubular necrosis.  Patient did receive vancomycin.  It may be wise to change this to a less nephrotoxic agent such as daptomycin or Zyvox.  Has small bowel obstruction and is probably third spacing.  Agree with IV fluids for rehydration and support. 2.  Metabolic acidosis patient on Metformin we will change IV fluids to IV bicarbonate.  Metformin has been discontinued. 3.  Acute respiratory distress with presumed healthcare associated pneumonia.  Continues on ventilator support followed by Dr. Shaune Pollack appreciate assistance from Dr. Juanetta Gosling. 4.  DKA insulin drip maintain IV fluids 5.  Small bowel obstruction evaluated by general surgery n.p.o. NG tube with intermittent suction IV fluids will be needed. 6.  Ongoing sinus tachycardia continue IV fluids.  Appreciate assistance from cardiology.  TSH within normal range 7.  History of hypertrophic cardiomyopathy and coronary artery disease with prior PCI.  Recent 2D echo performed. 8.  Pneumonia/severe sepsis with HCAP.  Would recommend switching vancomycin to something less nephrotoxic.   Garnetta BuddyMartin W Malic Rosten 06/11/2019, 11:05 AM

## 2019-06-11 NOTE — Progress Notes (Signed)
[facility]  CXR reviewed. Small apical PTX on CXR. Unusual given that this was a one stick IJ. Will get CXR repeat at 3:30pm.  Have notified Dr. Manuella Ghazi and RN. If any respiratory distress, order stat CXR and call me.  Should resolve on its own.   Curlene Labrum, MD [facility] [address] [place], [place] [address] [phone]/ [phone] (office)

## 2019-06-11 NOTE — Progress Notes (Signed)
*  PRELIMINARY RESULTS* Echocardiogram 2D Echocardiogram LIMITED has been performed.  Leavy Cella 06/11/2019, 12:00 PM

## 2019-06-11 NOTE — Progress Notes (Signed)
Nutrition Follow-up  DOCUMENTATION CODES:   Severe malnutrition in context of chronic illness  INTERVENTION:  If patient remains intubated and enteral nutrition is appropriate recommend initiating: -Vital 1.2 @ 25 ml/hr, advance 10 ml every 6 hrs as tolerated to goal rate 65 ml/hr (1560 ml/day)  Tube regimen at goal rate 65 ml/hr (1560 ml/day) and propofol at current rate provides 2120 kcal (95% of total kcal needs), 117 grams protein, 1264 ml free H20  Free water flushes per MD  Discontinue Glucerna; Pro-stat  NUTRITION DIAGNOSIS:  Severe Malnutrition related to chronic illness(CAD;hypertrophic cardiomyopathy; COVID+ 03/21/19) as evidenced by moderate fat depletion, severe fat depletion, severe muscle depletion. Ongoing   GOAL:  Patient will meet greater than or equal to 90% of their needs Progressing   MONITOR:   PO intake, Labs, Weight trends, I & O's, Supplement acceptance  REASON FOR ASSESSMENT:   Ventilator    ASSESSMENT:   73 year old male with medical history significant of DM, HTN, HLD, hypertrophic cardiomyopathy, CAD s/p PCI of LAD in 2009 who presented to ED with complaints of increasing SOB, generalized weakness, left shoulder pain that radiates into left arm into left torso, fever and lower abdominal pain over the past few days. CXR and CT without any acute findings; urinalysis showed evidence of ketones with blood sugar just under 250 in ED.  Patient admitted with metabolic acidosis. 8/29 Covid+ treated at home.  11/19 - R IJ central line 11/18 - intubated  Per chart review, pt with ongoing tachycardia; acute respiratory distress secondary to PNA/severe sepsis with HCAP.  Per chart review, patient tolerating diet yesterday with no nausea/vomiting, but had worsening distention prompting CT of abdomen showing SBO - NGT placed to low intermittent suction. Will continue to monitor; tube feeding recommendations have been provided above to initiate when  appropriate.    I/Os: +3165 ml since admit    +2988 ml x 24 hrs UOP 600 ml x 24 hrs  Patient is currently intubated on ventilator support MV: 16.7 L/min Temp (24hrs), Avg:101.5 F (38.6 C), Min:98.5 F (36.9 C), Max:102.6 F (39.2 C)  Propofol: 9.41 ml/hr provides 248 kcal  Per notes: AKI - IV fluids for rehydration/support DKA - on insulin drip  Medications reviewed and include: Maxipime, D5;NaCl, Fentanyl, Myxredlin, Propofol, NaHCO3 Labs: CBGS 120-195, BUN 60 (H), Cr 4.31 (H)    NUTRITION - FOCUSED PHYSICAL EXAM: 06/10/19 Findings: moderate fat depletion in buccal and orbital regions; severe fat depletion in upper arm, thoracic and lumbar regions. Severe muscle depletions in temporal, clavicle, clavicle and acromion bone, dorsal hand, patellar, anterior thigh, and posterior calf regions.    Diet Order:   Diet Order            Diet NPO time specified  Diet effective now              EDUCATION NEEDS:   Education needs have been addressed  Skin:  Skin Assessment: Reviewed RN Assessment  Last BM:  11/19 (type 4;brown;medium)  Height:   Ht Readings from Last 1 Encounters:  06/11/19 5\' 11"  (1.803 m)    Weight:   Wt Readings from Last 1 Encounters:  06/11/19 65.8 kg    Ideal Body Weight:  78.2 kg  BMI:  Body mass index is 20.23 kg/m.  Estimated Nutritional Needs:   Kcal:  2220  Protein:  105-118 (1.6-1.8 g/kg)  Fluid:  >/= 2 L/day   Lajuan Lines, RD, LDN Clinical Nutrition Office [phone] After Hours/Weekend Pager: [phone]

## 2019-06-11 NOTE — Progress Notes (Signed)
Pharmacy Antibiotic Note  Pharmacy has been re-consulted to dose vancomycin and initiate cefepime for this 73 yo male admitted two days ago with SIRS and possible sepsis.  Patient received a loading dose of vancomycin on 11-17 and two doses of Zosyn.  Patient has spiked a temperature to 101.29F and new blood cultures have been ordered.   Plan: No  need to re-load this patient with vancomycin due to increase in Scr to 3.72 Start vancomycin 500mg  IV q48h on 06/11/19 at 1800 Estimated AUC: 641 mcg*h/mL Start cefepime 1g IV q24h  Pharmacy will continue to monitor cultures, renal function, patient progress and vancomycin levels as clinically appropriate.    Height: 5\' 11"  (180.3 cm) Weight: 138 lb 3.7 oz (62.7 kg) IBW/kg (Calculated) : 75.3  Temp (24hrs), Avg:99.2 F (37.3 C), Min:98.2 F (36.8 C), Max:101.7 F (38.7 C)  Recent Labs  Lab 06/19/2019 0337 06/17/2019 0535  06/10/19 0045 06/10/19 0425 06/10/19 0852 06/10/19 1238 06/10/19 2127 06/11/19 0034  WBC 4.1  --   --   --   --   --   --  7.3 9.5  CREATININE 1.60*  --    < > 1.94* 2.03* 2.34* 2.66* 3.27*  --   LATICACIDVEN 1.4 1.9  --   --   --   --   --   --   --    < > = values in this interval not displayed.    Estimated Creatinine Clearance: 17.6 mL/min (A) (by C-G formula based on SCr of 3.27 mg/dL (H)).    No Known Allergies  Antimicrobials this admission: Vanc 11/17 >> Pip/tazo 11/17 >>   Microbiology results: 11/17 BC x2: NG  x1 day 11/17 COVID neg 11/17 MRSA PCR: neg 11/19 BC x2: pending   Thank you for allowing pharmacy to be a part of this patient's care.   Despina Pole, Pharm. D. Clinical Pharmacist 06/11/2019 1:29 AM

## 2019-06-11 NOTE — Progress Notes (Signed)
[facility]  CXR without ptx. Has AM CXR ordered for interval.  Curlene Labrum, MD [facility] [address] [place], [place] [address] [phone]/ [phone] (office)

## 2019-06-11 NOTE — Progress Notes (Signed)
MD Manuella Ghazi notified after multiple probe placement, warming digits, and multiple probes and monitoring cables staff are unable to obtain pulse ox reading. Per MD Manuella Ghazi obtain stat ABG if condition changes. Repeat chest x-ray ordered at 1500.

## 2019-06-11 NOTE — Progress Notes (Addendum)
[facility] Progress Note Patient Name: [name] [name] DOB: [birth year] MRN: [id number]   Date of Service  06/11/2019  HPI/Events of Note  Blood sugar 315 Needs  Vascular access.  eICU Interventions  Insulin infusion ordered. central line ordered.        [name] 06/11/2019, 4:53 AM

## 2019-06-11 NOTE — Progress Notes (Signed)
06/11/2019 Spiritual Care Consult--Patient Decline  Referred to patient and family this evening by patient Nurse Josiah Lobo. Once on Unit, Chaplain checked in with her and learned that patient had declined significantly over the past day and that the family had decided to make the patient a DNR. Met with spouse, Cornelia and sons, Elta Guadeloupe and Sherren Mocha this evening as they were bedside. Chaplain engaged them in life review and provided spiritual support, opportunity for reflection, and prayer. They are realistic about patients' decline and the possibility that he is facing EOL. They are supportive of each other and have good spiritual resources for coping both externally and internally. They attend and receive support from [facility] [place] in [place] where they reside and their Doristine Bosworth is aware of the changes that Mr. Kendzierski has made in the last 24 hours. Chaplain will report off to [facility] in the morning in order to ensure continuity of care.

## 2019-06-11 NOTE — Progress Notes (Signed)
MD shah notified of pt's temperature despite rectal Tylenol and ice packs. Cooling blanket applied to pt.

## 2019-06-11 NOTE — Progress Notes (Signed)
Xcover Tachycardia per RN  A/P Tachycardia likely due to Hcap/ SBO Metoprolol 5mg  iv x1

## 2019-06-11 NOTE — Procedures (Signed)
Procedure Note  06/11/19   Preoperative Diagnosis: Sepsis from HCAP, Acute kidney injury    Postoperative Diagnosis: Same   Procedure(s) Performed: Central Line placement, Right internal jugular    Surgeon: Lanell Matar. Constance Haw, MD   Assistants: None   Anesthesia: None    Complications: None    Ultrasound Imaging: Jugular vein, Jugular vein with wire inside     Indications: Mr. Boze is a 72 y.o. with sepsis from a HCAP and DKA who intubated overnight for worsening respiratory status and tachycardia. He is developing sepsis from a HCAP and also has DKA on his admission. I discussed the risk and benefits of placement of the central line with his wife, including but not limited to bleeding, infection, and risk of pneumothorax. She has given verbal / telephone consent for the procedure.    Procedure: The patient placed supine. The right chest and neck was prepped and draped in the usual sterile fashion.  Wearing full gown and gloves, I performed the procedure.  One percent lidocaine was used for local anesthesia. An ultrasound was utilized to assess the jugular vein.  The needle with syringe was advanced into the vein with dark venous return, and a wire was placed using the Seldinger technique without difficulty.  Ectopia was not noted.  The skin was knicked and a dilator was placed, and the three lumen catheter was placed over the wire with continued control of the wire.  There was good draw back of blood from all three lumens and each flushed easily with saline.  The catheter was secured in 4 points with 2-0 silk and a biopatch and dressing was placed.     The patient tolerated the procedure well, and the CXR was ordered to confirm position of the central line.   Curlene Labrum, MD [facility] [address] [place], [place] [address] [phone] (office)

## 2019-06-11 NOTE — Consult Note (Signed)
Consult requested by: [facility] hospitalist, Dr. Brigitte Pulse Consult requested for: Respiratory failure  HPI: This is a 73 year old who is known to have diabetes hypertension hypertrophic cardiomyopathy and who came to the emergency department because of weakness shoulder pain and fever.  He also said that he had some abdominal discomfort last few days and had some dysuria.  He was found to be in diabetic ketoacidosis which was treated and on the 18th he was able to be transitioned off of IV insulin drip as his diabetic ketoacidosis appeared to have resolved.  However he continued to have elevated heart rates had evaluation and was treated.  He continued having sinus tach on the afternoon of 1118 and echocardiogram was ordered.  He was taken off of Cardizem and metoprolol because of concerns about suppressing sinus tachycardia.  Overnight on the night of the 18th he continued having increasing tachypnea continue with tachycardia appeared to have increasing problems with DKA.  He had continued respiratory distress and eventually was intubated and placed on the ventilator.  He had repeat CT of the abdomen that shows a small bowel obstruction with a clear transition point and had left lower lobe pneumonia with clear air bronchograms as well as a left pleural effusion he was started back on vancomycin and cefepime.  He has had poor urine output and his renal function is worse.  He has what looks like metabolic acidemia that may be from his renal function.  He does not have any known history of pulmonary disease.  History is from the medical record is he is intubated and sedated and on the ventilator.  Past Medical History:  Diagnosis Date  . Bleeding acute gastric ulcer 08/2017  . Coronary artery disease    a. 10/2007 s/p PCI of LAD (3.5x23 Vision BMS); b. 05/2008 Cath/PCI: LM nl, LAD 60m 80 ISR (3.5x28 Promus DES), LCX min irregs, RI nl;  c. 12/2009 MV: EF 61%, no ischemia.  . Diabetes mellitus   . Dyslipidemia   .  Hypertension   . Hypertrophic cardiomyopathy ([place]    a. 03/2014 Echo: EF 60-65%, Gr 1 DD, sev septal basal hypertrophy, no SAM but near mid-cavitary obstruction, mild MR.     Family History  Problem Relation Age of Onset  . Kidney disease Mother   . Heart Problems Mother   . Colon cancer Brother        >60, deceased from colon ca     Social History   Socioeconomic History  . Marital status: Married    Spouse name: Not on file  . Number of children: 2  . Years of education: Not on file  . Highest education level: Not on file  Occupational History  . Occupation: retired  SScientific laboratory technician . Financial resource strain: Not on file  . Food insecurity    Worry: Not on file    Inability: Not on file  . Transportation needs    Medical: Not on file    Non-medical: Not on file  Tobacco Use  . Smoking status: Never Smoker  . Smokeless tobacco: Never Used  Substance and Sexual Activity  . Alcohol use: No  . Drug use: No  . Sexual activity: Yes    Birth control/protection: None  Lifestyle  . Physical activity    Days per week: Not on file    Minutes per session: Not on file  . Stress: Not on file  Relationships  . Social connections    Talks on phone: Not on file  Gets together: Not on file    Attends religious service: Not on file    Active member of club or organization: Not on file    Attends meetings of clubs or organizations: Not on file    Relationship status: Not on file  Other Topics Concern  . Not on file  Social History Narrative   Retired and married 2 sons one is a Teacher, music in [place]   No alcohol tobacco or drug use     ROS: Unobtainable    Objective: Vital signs in last 24 hours: Temp:  [98.2 F (36.8 C)-102.6 F (39.2 C)] 102.6 F (39.2 C) (11/19 0741) Pulse Rate:  [45-147] 138 (11/19 0741) Resp:  [20-55] 29 (11/19 0741) BP: (110-178)/(62-111) 123/86 (11/19 0700) SpO2:  [56 %-100 %] 100 % (11/19 0741) FiO2 (%):  [100 %] 100 % (11/19  0427) Weight:  [65.8 kg] 65.8 kg (11/19 0500) Weight change: 4.564 kg Last BM Date: 06/08/19  Intake/Output from previous day: 11/18 0701 - 11/19 0700 In: 3588 [I.V.:2353; IV Piggyback:1235] Out: 600 [Urine:600]  PHYSICAL EXAM Constitutional: Intubated sedated on the ventilator.  Eyes: Pupils react.  Ears nose mouth and throat: He has endotracheal and orogastric tubes in place.  Cardiovascular: He still has tachycardia.  I do not hear a gallop.  Respiratory: He has somewhat diminished breath sounds on the left with rales bilaterally left more than right.  Gastrointestinal: His abdomen seems somewhat distended.  Bowel sounds are hypoactive musculoskeletal: Cannot assess neurological: Cannot assess psychiatric: Cannot assess  Lab Results: Basic Metabolic Panel: Recent Labs    06/10/19 0425  06/11/19 0034 06/11/19 0614  NA 140   < > 137 135  K 3.2*   < > 4.3 4.5  CL 109   < > 110 108  CO2 20*   < > 13* 15*  GLUCOSE 159*   < > 305* 319*  BUN 32*   < > 50* 56*  CREATININE 2.03*   < > 3.48* 3.90*  CALCIUM 8.4*   < > 8.3* 8.0*  MG 2.1  --  2.0  --    < > = values in this interval not displayed.   Liver Function Tests: Recent Labs    06/08/2019 0337  AST 27  ALT 30  ALKPHOS 66  BILITOT 1.5*  PROT 6.9  ALBUMIN 3.4*   Recent Labs    05/27/2019 0337  LIPASE 34   No results for input(s): AMMONIA in the last 72 hours. CBC: Recent Labs    06/06/2019 0337 06/10/19 2127 06/11/19 0034  WBC 4.1 7.3 9.5  NEUTROABS 3.5  --   --   HGB 11.7* 14.0 14.2  HCT 35.9* 43.3 43.9  MCV 88.9 88.0 88.7  PLT 211 256 259   Cardiac Enzymes: Recent Labs    06/22/2019 0535  CKTOTAL 89   BNP: No results for input(s): PROBNP in the last 72 hours. D-Dimer: No results for input(s): DDIMER in the last 72 hours. CBG: Recent Labs    06/10/19 2114 06/10/19 2340 06/11/19 0452 06/11/19 0547 06/11/19 0650 06/11/19 0740  GLUCAP 276* 294* 313* 289* 307* 240*   Hemoglobin A1C: Recent Labs     06/11/2019 0337  HGBA1C 9.1*   Fasting Lipid Panel: Recent Labs    06/11/19 0043  TRIG 65   Thyroid Function Tests: Recent Labs    06/10/19 1403  TSH 0.804  FREET4 1.03   Anemia Panel: No results for input(s): VITAMINB12, FOLATE, FERRITIN, TIBC, IRON, RETICCTPCT in  the last 72 hours. Coagulation: No results for input(s): LABPROT, INR in the last 72 hours. Urine Drug Screen: Drugs of Abuse  No results found for: LABOPIA, COCAINSCRNUR, LABBENZ, AMPHETMU, THCU, LABBARB  Alcohol Level: Recent Labs    06/02/2019 1423  ETH <10   Urinalysis: Recent Labs    06/12/2019 0221  COLORURINE YELLOW  LABSPEC 1.020  PHURINE 5.0  GLUCOSEU >=500*  HGBUR MODERATE*  BILIRUBINUR NEGATIVE  KETONESUR 20*  PROTEINUR 30*  NITRITE NEGATIVE  LEUKOCYTESUR NEGATIVE   Misc. Labs:   ABGS: Recent Labs    06/10/19 2354  PHART 7.403  PO2ART 136*  HCO3 17.4*     MICROBIOLOGY: Recent Results (from the past 240 hour(s))  Culture, blood (routine x 2)     Status: None (Preliminary result)   Collection Time: 06/02/2019  3:39 AM   Specimen: BLOOD LEFT HAND  Result Value Ref Range Status   Specimen Description BLOOD LEFT HAND  Final   Special Requests   Final    BOTTLES DRAWN AEROBIC AND ANAEROBIC Blood Culture results may not be optimal due to an inadequate volume of blood received in culture bottles   Culture   Final    NO GROWTH 1 DAY Performed at [facility], [address], [place], [place] [address]    Report Status PENDING  Incomplete  Culture, blood (routine x 2)     Status: None (Preliminary result)   Collection Time: 06/21/2019  3:39 AM   Specimen: BLOOD  Result Value Ref Range Status   Specimen Description BLOOD RIGHT ANTECUBITAL  Final   Special Requests   Final    BOTTLES DRAWN AEROBIC AND ANAEROBIC Blood Culture adequate volume   Culture   Final    NO GROWTH 1 DAY Performed at [facility], [address] [place], [place] [address]    Report Status PENDING  Incomplete   SARS CORONAVIRUS 2 (TAT 6-24 HRS) Nasopharyngeal Nasopharyngeal Swab     Status: None   Collection Time: 06/16/2019  6:48 AM   Specimen: Nasopharyngeal Swab  Result Value Ref Range Status   SARS Coronavirus 2 NEGATIVE NEGATIVE Final    Comment: (NOTE) SARS-CoV-2 target nucleic acids are NOT DETECTED. The SARS-CoV-2 RNA is generally detectable in upper and lower respiratory specimens during the acute phase of infection. Negative results do not preclude SARS-CoV-2 infection, do not rule out co-infections with other pathogens, and should not be used as the sole basis for treatment or other patient management decisions. Negative results must be combined with clinical observations, patient history, and epidemiological information. The expected result is Negative. Fact Sheet for Patients: SugarRoll.be Fact Sheet for Healthcare Providers: https://www.woods-mathews.com/ This test is not yet approved or cleared by the [place] FDA and  has been authorized for detection and/or diagnosis of SARS-CoV-2 by FDA under an Emergency Use Authorization (EUA). This EUA will remain  in effect (meaning this test can be used) for the duration of the COVID-19 declaration under Section 56 4(b)(1) of the Act, 21 U.S.C. section 360bbb-3(b)(1), unless the authorization is terminated or revoked sooner. Performed at [facility], [place] [address] [place], [place] [address]   MRSA PCR Screening     Status: None   Collection Time: 06/13/2019  8:25 PM   Specimen: Nasal Mucosa; Nasopharyngeal  Result Value Ref Range Status   MRSA by PCR NEGATIVE NEGATIVE Final    Comment:        The GeneXpert MRSA Assay (FDA approved for NASAL specimens only), is  one component of a comprehensive MRSA colonization surveillance program. It is not intended to diagnose MRSA infection nor to guide or monitor treatment for MRSA infections. Performed at [facility], [address] [place], [place] [address]   Culture, blood (routine x 2)     Status: None (Preliminary result)   Collection Time: 06/11/19 12:34 AM   Specimen: BLOOD LEFT WRIST  Result Value Ref Range Status   Specimen Description BLOOD LEFT WRIST  Final   Special Requests   Final    BOTTLES DRAWN AEROBIC AND ANAEROBIC Blood Culture results may not be optimal due to an inadequate volume of blood received in culture bottles Performed at [facility], [address], [place], [place] [address]    Culture PENDING  Incomplete   Report Status PENDING  Incomplete  Culture, blood (routine x 2)     Status: None (Preliminary result)   Collection Time: 06/11/19 12:43 AM   Specimen: BLOOD RIGHT HAND  Result Value Ref Range Status   Specimen Description BLOOD RIGHT HAND  Final   Special Requests   Final    BOTTLES DRAWN AEROBIC ONLY Blood Culture results may not be optimal due to an inadequate volume of blood received in culture bottles Performed at [facility], [address], [place], [place] [address]    Culture PENDING  Incomplete   Report Status PENDING  Incomplete    Studies/Results: Ct Abdomen Pelvis Wo Contrast  Result Date: 06/11/2019 CLINICAL DATA:  73 year old male with abdominal pain and distension. Recently intubated. EXAM: CT ABDOMEN AND PELVIS WITHOUT CONTRAST TECHNIQUE: Multidetector CT imaging of the abdomen and pelvis was performed following the standard protocol without IV contrast. COMPARISON:  CT Abdomen and Pelvis 05/29/2019. FINDINGS: Lower chest: Increasing layering pleural effusions and confluent new left lower lobe consolidation with air bronchograms. Right lower lobe atelectasis. Middle lobes remain clear. No cardiomegaly or pericardial effusion. Hepatobiliary: Negative noncontrast liver and gallbladder. Pancreas: Negative noncontrast pancreas. Spleen: Negative aside from a small volume of adjacent free fluid. Adrenals/Urinary Tract: Stable since 2019 adrenal gland thickening such as  due to adrenal hyperplasia or benign adenomas. Nonobstructed kidneys. Increased bilateral perinephric soft tissue stranding since 2019 but stable recently. Proximal ureters appear decompressed. No nephrolithiasis. Urinary bladder decompressed by a Foley catheter now. Stomach/Bowel: Enteric tube now in place terminating in the gastric fundus. But there remains moderate gaseous distension of the stomach. The proximal duodenum is also dilated as before. There is oral contrast now in dilated small bowel loops in the left and central abdomen which measure up to 31 millimeters diameter. A gradual transition occurs to decompressed small bowel loops in the pelvis, with the most abrupt transition point identified on coronal image 42 corresponding to series 2, image 71 where gas-filled loops measuring 2 centimeters transition to fully decompressed distal small bowel loops which are just upstream of the terminal ileum. Stool-filled rectum. Sigmoid colon is redundant and contains gas and stool. Similar gas and stool at redundant splenic flexure. Retained low-density stool in the transverse colon. The hepatic flexure is also redundant. Less stool in the right colon. Decompressed cecum and terminal ileum on series 2, image 69. No pneumoperitoneum. Small volume of small bowel mesentery free fluid in the abdomen. Vascular/Lymphatic: Extensive Aortoiliac calcified atherosclerosis. Vascular patency is not evaluated in the absence of IV contrast. Reproductive: Urethral catheter now in place. Other: New or increased moderate volume fluid in the pelvis appears to be free fluid with simple fluid density (series 2, image 77). This  is adjacent to the stool-filled rectum, sigmoid colon, and some nondilated small bowel loops. Musculoskeletal: No acute osseous abnormality identified. IMPRESSION: 1. Small Bowel Obstruction with a transition point in the right pelvis on coronal image 42. This may be due to adhesions. Progressive small bowel  bowel dilatation since the CT on 06/10/2019 despite the presence of an NG tube terminating in the stomach. No free air.  Increasing free fluid, now moderate in the pelvis. 2. Increasing layering pleural effusions and new left lower lobe consolidation suspicious for Pneumonia. 3. Aortic Atherosclerosis (ICD10-I70.0). Electronically Signed   By: Genevie Ann M.D.   On: 06/11/2019 00:31   Ct Abdomen Pelvis Wo Contrast  Result Date: 06/14/2019 CLINICAL DATA:  Abdominal pain, fever EXAM: CT ABDOMEN AND PELVIS WITHOUT CONTRAST TECHNIQUE: Multidetector CT imaging of the abdomen and pelvis was performed following the standard protocol without IV contrast. COMPARISON:  09/05/2017 FINDINGS: Lower chest: Moderate left pleural effusion and small right pleural effusion. Compressive atelectasis in the lower lobes. Heart is normal size. Densely calcified coronary arteries and aorta. Hepatobiliary: No focal hepatic abnormality. Gallbladder unremarkable. Pancreas: No focal abnormality or ductal dilatation. Spleen: No focal abnormality.  Normal size. Adrenals/Urinary Tract: Left adrenal nodules are low-density and similar to prior study, likely adenomas. No visible renal mass. No hydronephrosis. No renal or ureteral stones. Urinary bladder unremarkable. Stomach/Bowel: Large stool burden throughout the colon. There are mildly prominent and dilated small bowel loops into the pelvis. Distal small bowel loops appear decompressed and grossly unremarkable. Findings are concerning for partial small bowel obstruction. Vascular/Lymphatic: Heavily calcified aorta and iliac vessels. No aneurysm or adenopathy. Reproductive: Mildly prominent prostate. Other: No free fluid or free air. Musculoskeletal: No acute bony abnormality. IMPRESSION: Mildly dilated small bowel loops with distal small bowel loops decompressed. Findings are concerning for partial small bowel obstruction. Exact transition or cause not visualized. Large stool burden throughout  the colon. Moderate left pleural effusion and small right pleural effusion. Bibasilar atelectasis. Left adrenal nodules again noted, similar to prior study, likely adenomas. Heavily calcified aorta and iliac vessels. Electronically Signed   By: Rolm Baptise M.D.   On: 06/18/2019 21:43   Ct Chest Wo Contrast  Result Date: 05/27/2019 CLINICAL DATA:  Left-sided chest pain. Shortness of breath. EXAM: CT CHEST WITHOUT CONTRAST TECHNIQUE: Multidetector CT imaging of the chest was performed following the standard protocol without IV contrast. COMPARISON:  Chest x-ray dated 06/21/2019 and CT scan of the abdomen dated 09/05/2017 FINDINGS: Cardiovascular: Aortic atherosclerosis. Coronary artery calcifications. Heart size is normal. No pericardial effusion. Mediastinum/Nodes: No enlarged mediastinal or axillary lymph nodes. Thyroid gland, trachea, and esophagus demonstrate no significant findings. Lungs/Pleura: There are no infiltrates. There is a small left pleural effusion. Tiny right effusion. Minimal secondary atelectasis at the lung bases. The lungs are otherwise clear. Upper Abdomen: There is chronic bilateral adrenal enlargement. Tiny amount of ascites in the left upper quadrant. Musculoskeletal: No chest wall mass or suspicious bone lesions identified. IMPRESSION: 1. Small left and tiny right pleural effusions with secondary atelectasis at the lung bases. 2. Chronic bilateral adrenal enlargement. 3. Tiny amount of ascites in the left upper quadrant. 4. Aortic Atherosclerosis (ICD10-I70.0). Electronically Signed   By: Lorriane Shire M.D.   On: 05/25/2019 12:14   Nm Pulmonary Perfusion  Result Date: 06/11/2019 CLINICAL DATA:  Chest pain and shortness of breath EXAM: NUCLEAR MEDICINE PERFUSION LUNG SCAN TECHNIQUE: Perfusion images were obtained in multiple projections after intravenous injection of radiopharmaceutical. Ventilation scans intentionally deferred if perfusion  scan and chest x-ray adequate for  interpretation during COVID 19 epidemic. Views: Anterior, posterior, left lateral, right lateral, RPO, LPO, RAO, LAO RADIOPHARMACEUTICALS:  1.5 mCi Tc-64mMAA IV COMPARISON:  Chest radiograph and chest CT June 09, 2019 FINDINGS: Radiotracer uptake is homogeneous and symmetric bilaterally. No perfusion defects evident. IMPRESSION: No appreciable perfusion defects. Very low probability of pulmonary embolus. Electronically Signed   By: WLowella GripIII M.D.   On: 06/05/2019 15:21   Dg Chest Port 1 View  Result Date: 06/10/2019 CLINICAL DATA:  Dyspnea. Shortness of breath. EXAM: PORTABLE CHEST 1 VIEW COMPARISON:  05/25/2019 FINDINGS: There is a small left pleural effusion. The lungs are otherwise clear. Heart size and vascularity are normal. Aortic atherosclerosis. No acute bone abnormality. IMPRESSION: 1. Small left pleural effusion. 2.  Aortic Atherosclerosis (ICD10-I70.0). 3. No other significant abnormalities. Electronically Signed   By: JLorriane ShireM.D.   On: 06/10/2019 21:12   Dg Chest Port 1v Same Day  Result Date: 06/10/2019 CLINICAL DATA:  Post intubation EXAM: PORTABLE CHEST 1 VIEW COMPARISON:  06/10/2019 FINDINGS: Interval placement of endotracheal tube with the tip 7 cm above the carina. NG tube tip is in the proximal stomach with the side port in the distal esophagus. Continued layering left effusion with left base atelectasis. Right lung clear. Heart is normal size. No acute bony abnormality. IMPRESSION: Endotracheal tube 7 cm above the carina. Continued layering left effusion with left base atelectasis. Electronically Signed   By: KRolm BaptiseM.D.   On: 06/10/2019 23:06    Medications:  Prior to Admission:  Medications Prior to Admission  Medication Sig Dispense Refill Last Dose  . amLODipine (NORVASC) 5 MG tablet TAKE ONE (1) TABLET EACH DAY 90 tablet 1 06/08/2019 at Unknown time  . aspirin EC 81 MG tablet Take 81 mg by mouth daily.   06/08/2019 at 900  .  Empagliflozin-linaGLIPtin (GLYXAMBI) 10-5 MG TABS Take 1 tablet by mouth daily.   06/08/2019 at Unknown time  . ferrous sulfate 325 (65 FE) MG tablet Take 1 tablet (325 mg total) by mouth 2 (two) times daily with a meal. 60 tablet 1   . fosinopril (MONOPRIL) 40 MG tablet TAKE ONE (1) TABLET EACH DAY 90 tablet 2 06/08/2019 at Unknown time  . glipiZIDE (GLUCOTROL) 10 MG tablet Take 1 tablet by mouth daily.   06/08/2019 at Unknown time  . metFORMIN (GLUCOPHAGE) 1000 MG tablet TAKE ONE TABLET BY MOUTH TWICE DAILY 60 tablet 0 06/08/2019 at Unknown time  . Multiple Vitamins-Minerals (MULTIVITAMIN PO) Take by mouth daily.   06/08/2019 at Unknown time  . pantoprazole (PROTONIX) 40 MG tablet Take 1 tablet (40 mg total) by mouth daily before breakfast. 90 tablet 3 06/08/2019 at Unknown time  . rosuvastatin (CRESTOR) 10 MG tablet Take 10 mg by mouth daily.   06/08/2019 at Unknown time   Scheduled: . aspirin EC  81 mg Oral Daily  . chlorhexidine gluconate (MEDLINE KIT)  15 mL Mouth Rinse BID  . Chlorhexidine Gluconate Cloth  6 each Topical Daily  . feeding supplement (GLUCERNA SHAKE)  237 mL Oral TID BM  . feeding supplement (PRO-STAT SUGAR FREE 64)  30 mL Oral BID  . heparin  5,000 Units Subcutaneous Q8H  . mouth rinse  15 mL Mouth Rinse 10 times per day  . multivitamin with minerals  1 tablet Oral Daily  . succinylcholine  100 mg Intravenous Once   Continuous: . sodium chloride 150 mL/hr at 06/11/19 0303  . sodium  chloride 75 mL/hr at 06/11/19 0542  . ceFEPime (MAXIPIME) IV 1 g (06/11/19 0157)  . dextrose 5 % and 0.45% NaCl    . fentaNYL infusion INTRAVENOUS 25 mcg/hr (06/11/19 0554)  . insulin 7.5 Units/hr (06/11/19 0609)  . propofol (DIPRIVAN) infusion 20 mcg/kg/min (06/11/19 0451)  . vancomycin     NLZ:[id number], dextrose, fentaNYL (SUBLIMAZE) injection, labetalol, ondansetron (ZOFRAN) IV  Assesment: He was admitted with diabetic ketoacidosis and has had significant issues since then.   He actually did pretty well with his DKA initially but then through the day yesterday he had increasing problems with tachycardia then developed increasing respiratory distress culminating in him being intubated and placed on the ventilator.  He has left lower lobe pneumonia and is on cefepime and vancomycin.  He also has a left pleural effusion.  He has small bowel obstruction  He has significantly worsened renal function and he does have metabolic acidemia that is probably multifactorial  He has had tachycardia and is known to have hypertrophic cardiomyopathy and is going to have repeat echocardiogram  He has previous history of coronary disease but did not have any complaints of chest pain although he did complain of arm pain apparently on admission  He is critically ill with multisystem failure Active Problems:   Hyperlipidemia   Hypertrophic cardiomyopathy ([place])   Hypertension   Type II diabetes mellitus ([place])   Metabolic acidosis   DKA (diabetic ketoacidoses) ([place])   AKI (acute kidney injury) ([place])   Acute respiratory failure with hypoxia ([place])   Abdominal distension    Plan: Continue ventilator support.  Repeat chest x-ray this morning.  Repeat blood gas this morning.    LOS: 1 day   Alonza Bogus 06/11/2019, 7:54 AM

## 2019-06-11 NOTE — Progress Notes (Signed)
CRITICAL VALUE ALERT  Critical Value:  Lactic Acid 4.5  Date & Time Notied:  06/11/2019 1050  Provider Notified: Manuella Ghazi   Orders Received/Actions taken: 2L LR bolus

## 2019-06-11 NOTE — Consult Note (Signed)
[facility] Consult  Reason for Consult: SBO  Referring Physician:  Dr. Manuella Ghazi   Chief Complaint    Weakness      HPI: [name] [name] is a 73 y.o. male with diabetes, HTN, hypertrophic cardiomyopathy who presented to the ED with worsening SOB and weakness as well as pain in the left arm and shoulder. He had fevers prior to arrival to 101, and has continued to have fevers since admission. He was admitted and diagnosed with DKA, tachycardia, and has developed HCAP, multi system organ failure with worsening respiratory status, requiring intubation and renal failure.  He had some abdominal pain and a CT a/p was performed that showed concern for SBO. I was asked to see the patient for a central venous line as well as for his SBO.  The patient is sedated and ventilated.  His last BM per RN reports was 2 days prior.  Past Medical History:  Diagnosis Date  . Bleeding acute gastric ulcer 08/2017  . Coronary artery disease    a. 10/2007 s/p PCI of LAD (3.5x23 Vision BMS); b. 05/2008 Cath/PCI: LM nl, LAD 61m 80 ISR (3.5x28 Promus DES), LCX min irregs, RI nl;  c. 12/2009 MV: EF 61%, no ischemia.  . Diabetes mellitus   . Dyslipidemia   . Hypertension   . Hypertrophic cardiomyopathy ([facility]    a. 03/2014 Echo: EF 60-65%, Gr 1 DD, sev septal basal hypertrophy, no SAM but near mid-cavitary obstruction, mild MR.    Past Surgical History:  Procedure Laterality Date  . BACK SURGERY  1992  . CORONARY STENT PLACEMENT  06/03/2008   Promus stent to mid LAD; 10/23/2007 Vision stent to mid LAD  . ESOPHAGOGASTRODUODENOSCOPY N/A 09/06/2017   Procedure: ESOPHAGOGASTRODUODENOSCOPY (EGD);  Surgeon: GGatha Mayer MD;  [facility];  Service: Endoscopy;  Laterality: N/A;  . FEMORAL ENDARTERECTOMY  2010   right, with bovine patch angioplasty;  post cath, r./t aneursym/claudication  . INGUINAL HERNIA REPAIR Left 08/06/2013   Procedure: HERNIA REPAIR INGUINAL ADULT;  Surgeon: WScherry Ran  MD;  Location: [facility];  Service: General;  Laterality: Left;  . TRANSTHORACIC ECHOCARDIOGRAM  01/12/2010   EF>55%; mild concentric LVH; mild MR, mild TR    Family History  Problem Relation Age of Onset  . Kidney disease Mother   . Heart Problems Mother   . Colon cancer Brother        >60, deceased from colon ca    Social History   Tobacco Use  . Smoking status: Never Smoker  . Smokeless tobacco: Never Used  Substance Use Topics  . Alcohol use: No  . Drug use: No    Medications:  I have reviewed the patient's current medications. Prior to Admission:  Medications Prior to Admission  Medication Sig Dispense Refill Last Dose  . amLODipine (NORVASC) 5 MG tablet TAKE ONE (1) TABLET EACH DAY 90 tablet 1 06/08/2019 at Unknown time  . aspirin EC 81 MG tablet Take 81 mg by mouth daily.   06/08/2019 at 900  . Empagliflozin-linaGLIPtin (GLYXAMBI) 10-5 MG TABS Take 1 tablet by mouth daily.   06/08/2019 at Unknown time  . ferrous sulfate 325 (65 FE) MG tablet Take 1 tablet (325 mg total) by mouth 2 (two) times daily with a meal. 60 tablet 1   . fosinopril (MONOPRIL) 40 MG tablet TAKE ONE (1) TABLET EACH DAY 90 tablet 2 06/08/2019 at Unknown time  . glipiZIDE (GLUCOTROL) 10 MG tablet Take 1 tablet by mouth daily.  06/08/2019 at Unknown time  . metFORMIN (GLUCOPHAGE) 1000 MG tablet TAKE ONE TABLET BY MOUTH TWICE DAILY 60 tablet 0 06/08/2019 at Unknown time  . Multiple Vitamins-Minerals (MULTIVITAMIN PO) Take by mouth daily.   06/08/2019 at Unknown time  . pantoprazole (PROTONIX) 40 MG tablet Take 1 tablet (40 mg total) by mouth daily before breakfast. 90 tablet 3 06/08/2019 at Unknown time  . rosuvastatin (CRESTOR) 10 MG tablet Take 10 mg by mouth daily.   06/08/2019 at Unknown time   Scheduled: . aspirin EC  81 mg Oral Daily  . chlorhexidine gluconate (MEDLINE KIT)  15 mL Mouth Rinse BID  . Chlorhexidine Gluconate Cloth  6 each Topical Daily  . feeding supplement (GLUCERNA SHAKE)  237 mL  Oral TID BM  . feeding supplement (PRO-STAT SUGAR FREE 64)  30 mL Oral BID  . heparin  5,000 Units Subcutaneous Q8H  . mouth rinse  15 mL Mouth Rinse 10 times per day  . multivitamin with minerals  1 tablet Oral Daily  . succinylcholine  100 mg Intravenous Once   Continuous: . sodium chloride Stopped (06/11/19 0859)  . ceFEPime (MAXIPIME) IV Stopped (06/12/19 0409)  . dextrose 5 % and 0.45% NaCl 75 mL/hr at 06/12/19 1253  . fentaNYL infusion INTRAVENOUS 50 mcg/hr (06/12/19 1235)  . insulin 0.3 mL/hr at 06/12/19 1235  . propofol (DIPRIVAN) infusion 35 mcg/kg/min (06/12/19 1235)  .  sodium bicarbonate (isotonic) infusion in sterile water 75 mL/hr at 06/12/19 1322   YEM:[id number], dextrose, fentaNYL (SUBLIMAZE) injection, labetalol, ondansetron (ZOFRAN) IV  No Known Allergies   ROS:  Review of systems not obtained due to patient factors.  Blood pressure (!) 136/112, pulse (!) 149, temperature (!) 102.4 F (39.1 C), resp. rate (!) 27, height 5' 11" (1.803 m), weight 65.8 kg, SpO2 100 %. Physical Exam Vitals signs reviewed.  Constitutional:      General: He is not in acute distress. HENT:     Head: Normocephalic.     Comments: Temporal wasting    Nose: Nose normal.  Eyes:     Comments: Eyes closed  Neck:     Musculoskeletal: No neck rigidity.  Cardiovascular:     Rate and Rhythm: Tachycardia present.  Pulmonary:     Comments: On ventilator, breathing comfortably Abdominal:     General: There is distension.     Palpations: Abdomen is soft.     Comments: No grimace or response to palpation  Musculoskeletal:        General: No swelling.  Skin:    General: Skin is warm.  Neurological:     Comments: Intubated and sedated, no movement to pain with central line placement     Results: Results for orders placed or performed during the hospital encounter of 06/02/2019 (from the past 48 hour(s))  Blood gas, arterial     Status: Abnormal   Collection Time: 06/20/2019  1:11 PM   Result Value Ref Range   FIO2 21.00    pH, Arterial 7.364 7.350 - 7.450   pCO2 arterial 22.1 (L) 32.0 - 48.0 mmHg   pO2, Arterial 78.2 (L) 83.0 - 108.0 mmHg   Bicarbonate 15.6 (L) 20.0 - 28.0 mmol/L   Acid-base deficit 12.1 (H) 0.0 - 2.0 mmol/L   O2 Saturation 94.6 %   Patient temperature 37.7    Allens test (pass/fail) PASS PASS    Comment: Performed at [facility], [address], [place], [place] [address]  Salicylate level     Status: None  Collection Time: 06/10/2019  2:23 PM  Result Value Ref Range   Salicylate Lvl <9.9 2.8 - 30.0 mg/dL    Comment: Performed at [facility], [address] [place], [place] [address]  Ethanol     Status: None   Collection Time: 05/30/2019  2:23 PM  Result Value Ref Range   Alcohol, Ethyl (B) <10 <10 mg/dL    Comment: (NOTE) Lowest detectable limit for serum alcohol is 10 mg/dL. For medical purposes only. Performed at [facility], [address], [place], [place] [address]   Beta-hydroxybutyric acid     Status: Abnormal   Collection Time: 06/07/2019  2:23 PM  Result Value Ref Range   Beta-Hydroxybutyric Acid 3.04 (H) 0.05 - 0.27 mmol/L    Comment: Performed at [facility], [address], [place], [place] [address]  CBG monitoring, ED     Status: Abnormal   Collection Time: 06/21/2019  6:46 PM  Result Value Ref Range   Glucose-Capillary 304 (H) 70 - 99 mg/dL  CBG monitoring, ED     Status: Abnormal   Collection Time: 06/04/2019  8:02 PM  Result Value Ref Range   Glucose-Capillary 309 (H) 70 - 99 mg/dL   Comment 1 Notify RN    Comment 2 Document in Chart   MRSA PCR Screening     Status: None   Collection Time: 06/16/2019  8:25 PM   Specimen: Nasal Mucosa; Nasopharyngeal  Result Value Ref Range   MRSA by PCR NEGATIVE NEGATIVE    Comment:        The GeneXpert MRSA Assay (FDA approved for NASAL specimens only), is one component of a comprehensive MRSA colonization surveillance program. It is not intended to diagnose MRSA infection nor to  guide or monitor treatment for MRSA infections. Performed at [facility], [address], [place], [place] [address]   Glucose, capillary     Status: Abnormal   Collection Time: 06/12/2019  9:30 PM  Result Value Ref Range   Glucose-Capillary 297 (H) 70 - 99 mg/dL   Comment 1 Notify RN    Comment 2 Document in Chart   Glucose, capillary     Status: Abnormal   Collection Time: 06/08/2019 10:32 PM  Result Value Ref Range   Glucose-Capillary 267 (H) 70 - 99 mg/dL  Glucose, capillary     Status: Abnormal   Collection Time: 06/01/2019 11:27 PM  Result Value Ref Range   Glucose-Capillary 205 (H) 70 - 99 mg/dL  Glucose, capillary     Status: Abnormal   Collection Time: 06/10/19 12:26 AM  Result Value Ref Range   Glucose-Capillary 183 (H) 70 - 99 mg/dL   Comment 1 Notify RN    Comment 2 Document in Chart   Beta-hydroxybutyric acid     Status: None   Collection Time: 06/10/19 12:45 AM  Result Value Ref Range   Beta-Hydroxybutyric Acid 0.18 0.05 - 0.27 mmol/L    Comment: Performed at [facility], [address], [place], [place] [address]  Basic metabolic panel     Status: Abnormal   Collection Time: 06/10/19 12:45 AM  Result Value Ref Range   Sodium 137 135 - 145 mmol/L   Potassium 3.1 (L) 3.5 - 5.1 mmol/L    Comment: DELTA CHECK NOTED   Chloride 107 98 - 111 mmol/L   CO2 19 (L) 22 - 32 mmol/L   Glucose, Bld 200 (H) 70 - 99 mg/dL   BUN 30 (H) 8 - 23 mg/dL   Creatinine, Ser 1.94 (  H) 0.61 - 1.24 mg/dL   Calcium 8.2 (L) 8.9 - 10.3 mg/dL   GFR calc non Af Amer 33 (L) >60 mL/min   GFR calc Af Amer 38 (L) >60 mL/min   Anion gap 11 5 - 15    Comment: Performed at [facility], [address] [place], [place] [address]  Glucose, capillary     Status: Abnormal   Collection Time: 06/10/19  1:22 AM  Result Value Ref Range   Glucose-Capillary 210 (H) 70 - 99 mg/dL   Comment 1 Notify RN    Comment 2 Document in Chart   Glucose, capillary     Status: Abnormal   Collection Time: 06/10/19  2:26  AM  Result Value Ref Range   Glucose-Capillary 175 (H) 70 - 99 mg/dL   Comment 1 Notify RN    Comment 2 Document in Chart   Glucose, capillary     Status: Abnormal   Collection Time: 06/10/19  3:30 AM  Result Value Ref Range   Glucose-Capillary 161 (H) 70 - 99 mg/dL   Comment 1 Notify RN    Comment 2 Document in Chart   Basic metabolic panel     Status: Abnormal   Collection Time: 06/10/19  4:25 AM  Result Value Ref Range   Sodium 140 135 - 145 mmol/L   Potassium 3.2 (L) 3.5 - 5.1 mmol/L   Chloride 109 98 - 111 mmol/L   CO2 20 (L) 22 - 32 mmol/L   Glucose, Bld 159 (H) 70 - 99 mg/dL   BUN 32 (H) 8 - 23 mg/dL   Creatinine, Ser 2.03 (H) 0.61 - 1.24 mg/dL   Calcium 8.4 (L) 8.9 - 10.3 mg/dL   GFR calc non Af Amer 31 (L) >60 mL/min   GFR calc Af Amer 36 (L) >60 mL/min   Anion gap 11 5 - 15    Comment: Performed at [facility], [address], [place], [place] [address]  Magnesium     Status: None   Collection Time: 06/10/19  4:25 AM  Result Value Ref Range   Magnesium 2.1 1.7 - 2.4 mg/dL    Comment: Performed at [facility], [address], [place], [place] [address]  Glucose, capillary     Status: Abnormal   Collection Time: 06/10/19  4:26 AM  Result Value Ref Range   Glucose-Capillary 134 (H) 70 - 99 mg/dL   Comment 1 Notify RN    Comment 2 Document in Chart   Glucose, capillary     Status: Abnormal   Collection Time: 06/10/19  5:30 AM  Result Value Ref Range   Glucose-Capillary 135 (H) 70 - 99 mg/dL   Comment 1 Notify RN    Comment 2 Document in Chart   Glucose, capillary     Status: Abnormal   Collection Time: 06/10/19  6:30 AM  Result Value Ref Range   Glucose-Capillary 166 (H) 70 - 99 mg/dL   Comment 1 Notify RN    Comment 2 Document in Chart   Glucose, capillary     Status: Abnormal   Collection Time: 06/10/19  8:28 AM  Result Value Ref Range   Glucose-Capillary 105 (H) 70 - 99 mg/dL  Basic metabolic panel     Status: Abnormal   Collection Time: 06/10/19  8:52  AM  Result Value Ref Range   Sodium 140 135 - 145 mmol/L   Potassium 4.4 3.5 - 5.1 mmol/L    Comment: DELTA CHECK NOTED   Chloride 106  98 - 111 mmol/L   CO2 19 (L) 22 - 32 mmol/L   Glucose, Bld 83 70 - 99 mg/dL   BUN 35 (H) 8 - 23 mg/dL   Creatinine, Ser 2.34 (H) 0.61 - 1.24 mg/dL   Calcium 8.6 (L) 8.9 - 10.3 mg/dL   GFR calc non Af Amer 26 (L) >60 mL/min   GFR calc Af Amer 31 (L) >60 mL/min   Anion gap 15 5 - 15    Comment: Performed at [facility], [address], [place], [place] [address]  Beta-hydroxybutyric acid     Status: None   Collection Time: 06/10/19  8:52 AM  Result Value Ref Range   Beta-Hydroxybutyric Acid 0.11 0.05 - 0.27 mmol/L    Comment: Performed at [facility], [address] [place], [place] [address]  Glucose, capillary     Status: Abnormal   Collection Time: 06/10/19 11:35 AM  Result Value Ref Range   Glucose-Capillary 112 (H) 70 - 99 mg/dL  Basic metabolic panel     Status: Abnormal   Collection Time: 06/10/19 12:38 PM  Result Value Ref Range   Sodium 139 135 - 145 mmol/L   Potassium 4.2 3.5 - 5.1 mmol/L   Chloride 105 98 - 111 mmol/L   CO2 18 (L) 22 - 32 mmol/L   Glucose, Bld 133 (H) 70 - 99 mg/dL   BUN 38 (H) 8 - 23 mg/dL   Creatinine, Ser 2.66 (H) 0.61 - 1.24 mg/dL   Calcium 8.4 (L) 8.9 - 10.3 mg/dL   GFR calc non Af Amer 23 (L) >60 mL/min   GFR calc Af Amer 26 (L) >60 mL/min   Anion gap 16 (H) 5 - 15    Comment: Performed at [facility], [address], [place], [place] [address]  TSH     Status: None   Collection Time: 06/10/19  2:03 PM  Result Value Ref Range   TSH 0.804 0.350 - 4.500 uIU/mL    Comment: Performed by a 3rd Generation assay with a functional sensitivity of <=0.01 uIU/mL. Performed at [facility], [address] [place], [place] [address]   T4, free     Status: None   Collection Time: 06/10/19  2:03 PM  Result Value Ref Range   Free T4 1.03 0.61 - 1.12 ng/dL    Comment: (NOTE) Biotin ingestion may interfere with free T4  tests. If the results are inconsistent with the TSH level, previous test results, or the clinical presentation, then consider biotin interference. If needed, order repeat testing after stopping biotin. Performed at [facility], [address], [place], [place] [address]   Sodium, urine, random     Status: None   Collection Time: 06/10/19  3:00 PM  Result Value Ref Range   Sodium, Ur 31 mmol/L    Comment: Performed at [facility], [address] [place], [place] [address]  Creatinine, urine, random     Status: None   Collection Time: 06/10/19  3:00 PM  Result Value Ref Range   Creatinine, Urine 62.69 mg/dL    Comment: Performed at [facility], [address] [place], [place] [address]  Glucose, capillary     Status: Abnormal   Collection Time: 06/10/19  5:06 PM  Result Value Ref Range   Glucose-Capillary 237 (H) 70 - 99 mg/dL  Blood gas, arterial     Status: Abnormal   Collection Time: 06/10/19  8:55 PM  Result Value Ref Range   FIO2 30.00  pH, Arterial 7.450 7.350 - 7.450   pCO2 arterial 20.1 (L) 32.0 - 48.0 mmHg   pO2, Arterial 60.4 (L) 83.0 - 108.0 mmHg   Bicarbonate 17.9 (L) 20.0 - 28.0 mmol/L   Acid-base deficit 9.6 (H) 0.0 - 2.0 mmol/L   O2 Saturation 91.2 %   Patient temperature 37.0    Allens test (pass/fail) PASS PASS    Comment: Performed at [facility], [address], [place], [place] [address]  Glucose, capillary     Status: Abnormal   Collection Time: 06/10/19  9:14 PM  Result Value Ref Range   Glucose-Capillary 276 (H) 70 - 99 mg/dL  Basic metabolic panel     Status: Abnormal   Collection Time: 06/10/19  9:27 PM  Result Value Ref Range   Sodium 134 (L) 135 - 145 mmol/L   Potassium 4.6 3.5 - 5.1 mmol/L   Chloride 107 98 - 111 mmol/L   CO2 13 (L) 22 - 32 mmol/L   Glucose, Bld 313 (H) 70 - 99 mg/dL   BUN 48 (H) 8 - 23 mg/dL   Creatinine, Ser 3.27 (H) 0.61 - 1.24 mg/dL   Calcium 8.0 (L) 8.9 - 10.3 mg/dL   GFR calc non Af Amer 18 (L) >60 mL/min    GFR calc Af Amer 20 (L) >60 mL/min   Anion gap 14 5 - 15    Comment: Performed at [facility], [address] [place], [place] [address]  CBC     Status: Abnormal   Collection Time: 06/10/19  9:27 PM  Result Value Ref Range   WBC 7.3 4.0 - 10.5 K/uL   RBC 4.92 4.22 - 5.81 MIL/uL   Hemoglobin 14.0 13.0 - 17.0 g/dL   HCT 43.3 39.0 - 52.0 %   MCV 88.0 80.0 - 100.0 fL   MCH 28.5 26.0 - 34.0 pg   MCHC 32.3 30.0 - 36.0 g/dL   RDW 13.2 11.5 - 15.5 %   Platelets 256 150 - 400 K/uL   nRBC 0.6 (H) 0.0 - 0.2 %    Comment: Performed at [facility], [address], [place], [place] [address]  Troponin I (High Sensitivity)     Status: Abnormal   Collection Time: 06/10/19  9:27 PM  Result Value Ref Range   Troponin I (High Sensitivity) 41 (H) <18 ng/L    Comment: DELTA CHECK NOTED Performed at [facility], [address] [place], [place] [address]   Glucose, capillary     Status: Abnormal   Collection Time: 06/10/19 11:40 PM  Result Value Ref Range   Glucose-Capillary 294 (H) 70 - 99 mg/dL  Blood gas, arterial     Status: Abnormal   Collection Time: 06/10/19 11:54 PM  Result Value Ref Range   FIO2 100.00    Delivery systems VENTILATOR    pH, Arterial 7.403 7.350 - 7.450   pCO2 arterial 22.3 (L) 32.0 - 48.0 mmHg   pO2, Arterial 136 (H) 83.0 - 108.0 mmHg   Bicarbonate 17.4 (L) 20.0 - 28.0 mmol/L   Acid-base deficit 10.2 (H) 0.0 - 2.0 mmol/L   O2 Saturation 98.3 %   Patient temperature 38.9    Drawn by [id number]    Allens test (pass/fail) PASS PASS    Comment: Performed at [facility], [address] [place], [place] [address]  AMBMP     Status: Abnormal   Collection Time: 06/11/19 12:34 AM  Result Value Ref Range   Sodium 137 135 - 145 mmol/L  Potassium 4.3 3.5 - 5.1 mmol/L   Chloride 110 98 - 111 mmol/L   CO2 13 (L) 22 - 32 mmol/L   Glucose, Bld 305 (H) 70 - 99 mg/dL   BUN 50 (H) 8 - 23 mg/dL   Creatinine, Ser 3.48 (H) 0.61 - 1.24 mg/dL   Calcium 8.3 (L) 8.9 - 10.3 mg/dL   GFR  calc non Af Amer 16 (L) >60 mL/min   GFR calc Af Amer 19 (L) >60 mL/min   Anion gap 14 5 - 15    Comment: Performed at [facility], [address], [place], [place] [address]  [facility]     Status: Abnormal   Collection Time: 06/11/19 12:34 AM  Result Value Ref Range   WBC 9.5 4.0 - 10.5 K/uL   RBC 4.95 4.22 - 5.81 MIL/uL   Hemoglobin 14.2 13.0 - 17.0 g/dL   HCT 43.9 39.0 - 52.0 %   MCV 88.7 80.0 - 100.0 fL   MCH 28.7 26.0 - 34.0 pg   MCHC 32.3 30.0 - 36.0 g/dL   RDW 13.3 11.5 - 15.5 %   Platelets 259 150 - 400 K/uL   nRBC 0.6 (H) 0.0 - 0.2 %    Comment: Performed at [facility], [address] [place], [place] [address]  Magnesium     Status: None   Collection Time: 06/11/19 12:34 AM  Result Value Ref Range   Magnesium 2.0 1.7 - 2.4 mg/dL    Comment: Performed at [facility], [address], [place], [place] [address]  Culture, blood (routine x 2)     Status: None (Preliminary result)   Collection Time: 06/11/19 12:34 AM   Specimen: BLOOD LEFT WRIST  Result Value Ref Range   Specimen Description BLOOD LEFT WRIST    Special Requests      BOTTLES DRAWN AEROBIC AND ANAEROBIC Blood Culture results may not be optimal due to an inadequate volume of blood received in culture bottles   Culture      NO GROWTH < 12 HOURS Performed at [facility], [address], [place], [place] [address]    Report Status PENDING   Culture, blood (routine x 2)     Status: None (Preliminary result)   Collection Time: 06/11/19 12:43 AM   Specimen: BLOOD RIGHT HAND  Result Value Ref Range   Specimen Description BLOOD RIGHT HAND    Special Requests      BOTTLES DRAWN AEROBIC ONLY Blood Culture results may not be optimal due to an inadequate volume of blood received in culture bottles   Culture      NO GROWTH < 12 HOURS Performed at [facility], [address], [place], [place] [address]    Report Status PENDING   Triglycerides     Status: None   Collection Time: 06/11/19 12:43 AM  Result Value Ref  Range   Triglycerides 65 <150 mg/dL    Comment: Performed at [facility], [address] [place], [place] [address]  Glucose, capillary     Status: Abnormal   Collection Time: 06/11/19  4:52 AM  Result Value Ref Range   Glucose-Capillary 313 (H) 70 - 99 mg/dL  Glucose, capillary     Status: Abnormal   Collection Time: 06/11/19  5:47 AM  Result Value Ref Range   Glucose-Capillary 289 (H) 70 - 99 mg/dL  Basic metabolic panel     Status: Abnormal   Collection Time: 06/11/19  6:14 AM  Result Value Ref Range   Sodium  135 135 - 145 mmol/L   Potassium 4.5 3.5 - 5.1 mmol/L   Chloride 108 98 - 111 mmol/L   CO2 15 (L) 22 - 32 mmol/L   Glucose, Bld 319 (H) 70 - 99 mg/dL   BUN 56 (H) 8 - 23 mg/dL   Creatinine, Ser 3.90 (H) 0.61 - 1.24 mg/dL   Calcium 8.0 (L) 8.9 - 10.3 mg/dL   GFR calc non Af Amer 14 (L) >60 mL/min   GFR calc Af Amer 17 (L) >60 mL/min   Anion gap 12 5 - 15    Comment: Performed at [facility], [address] [place], [place] [address]  Beta-hydroxybutyric acid     Status: Abnormal   Collection Time: 06/11/19  6:14 AM  Result Value Ref Range   Beta-Hydroxybutyric Acid 0.77 (H) 0.05 - 0.27 mmol/L    Comment: Performed at [facility], [address] [place], [place] [address]  Glucose, capillary     Status: Abnormal   Collection Time: 06/11/19  6:50 AM  Result Value Ref Range   Glucose-Capillary 307 (H) 70 - 99 mg/dL  Glucose, capillary     Status: Abnormal   Collection Time: 06/11/19  7:40 AM  Result Value Ref Range   Glucose-Capillary 240 (H) 70 - 99 mg/dL  Blood gas, arterial today     Status: Abnormal   Collection Time: 06/11/19  8:50 AM  Result Value Ref Range   FIO2 100.00    pH, Arterial 7.396 7.350 - 7.450   pCO2 arterial 23.0 (L) 32.0 - 48.0 mmHg   pO2, Arterial 102 83.0 - 108.0 mmHg   Bicarbonate 17.2 (L) 20.0 - 28.0 mmol/L   Acid-base deficit 10.2 (H) 0.0 - 2.0 mmol/L   O2 Saturation 97.3 %   Patient temperature 39.2    Allens test (pass/fail) PASS  PASS    Comment: Performed at [facility], [address], [place], [place] [address]  Glucose, capillary     Status: Abnormal   Collection Time: 06/11/19  8:55 AM  Result Value Ref Range   Glucose-Capillary 210 (H) 70 - 99 mg/dL  Basic metabolic panel     Status: Abnormal   Collection Time: 06/11/19  9:33 AM  Result Value Ref Range   Sodium 138 135 - 145 mmol/L   Potassium 4.2 3.5 - 5.1 mmol/L   Chloride 108 98 - 111 mmol/L   CO2 13 (L) 22 - 32 mmol/L   Glucose, Bld 221 (H) 70 - 99 mg/dL   BUN 60 (H) 8 - 23 mg/dL   Creatinine, Ser 4.31 (H) 0.61 - 1.24 mg/dL   Calcium 8.4 (L) 8.9 - 10.3 mg/dL   GFR calc non Af Amer 13 (L) >60 mL/min   GFR calc Af Amer 15 (L) >60 mL/min   Anion gap 17 (H) 5 - 15    Comment: Performed at [facility], [address] [place], [place] [address]  Lactic acid, plasma     Status: Abnormal   Collection Time: 06/11/19  9:33 AM  Result Value Ref Range   Lactic Acid, Venous 4.5 (HH) 0.5 - 1.9 mmol/L    Comment: CRITICAL RESULT CALLED TO, READ BACK BY AND VERIFIED WITH: SHELTON,A AT 10:10AM ON 06/11/19 BY [facility] Performed at [facility], [address] [place], [place] [address]   Protime-INR     Status: Abnormal   Collection Time: 06/11/19  9:33 AM  Result Value Ref Range   Prothrombin Time 17.8 (H) 11.4 - 15.2 seconds  INR 1.5 (H) 0.8 - 1.2    Comment: (NOTE) INR goal varies based on device and disease states. Performed at [facility], [address], [place], [place] [address]   Glucose, capillary     Status: Abnormal   Collection Time: 06/11/19  9:59 AM  Result Value Ref Range   Glucose-Capillary 195 (H) 70 - 99 mg/dL   Personally reviewed- SBO in RLQ, bowel tapers down, no thickening appreciated  Ct Abdomen Pelvis Wo Contrast  Result Date: 06/11/2019 CLINICAL DATA:  73 year old male with abdominal pain and distension. Recently intubated. EXAM: CT ABDOMEN AND PELVIS WITHOUT CONTRAST TECHNIQUE: Multidetector CT imaging of the abdomen and  pelvis was performed following the standard protocol without IV contrast. COMPARISON:  CT Abdomen and Pelvis 06/08/2019. FINDINGS: Lower chest: Increasing layering pleural effusions and confluent new left lower lobe consolidation with air bronchograms. Right lower lobe atelectasis. Middle lobes remain clear. No cardiomegaly or pericardial effusion. Hepatobiliary: Negative noncontrast liver and gallbladder. Pancreas: Negative noncontrast pancreas. Spleen: Negative aside from a small volume of adjacent free fluid. Adrenals/Urinary Tract: Stable since 2019 adrenal gland thickening such as due to adrenal hyperplasia or benign adenomas. Nonobstructed kidneys. Increased bilateral perinephric soft tissue stranding since 2019 but stable recently. Proximal ureters appear decompressed. No nephrolithiasis. Urinary bladder decompressed by a Foley catheter now. Stomach/Bowel: Enteric tube now in place terminating in the gastric fundus. But there remains moderate gaseous distension of the stomach. The proximal duodenum is also dilated as before. There is oral contrast now in dilated small bowel loops in the left and central abdomen which measure up to 31 millimeters diameter. A gradual transition occurs to decompressed small bowel loops in the pelvis, with the most abrupt transition point identified on coronal image 42 corresponding to series 2, image 71 where gas-filled loops measuring 2 centimeters transition to fully decompressed distal small bowel loops which are just upstream of the terminal ileum. Stool-filled rectum. Sigmoid colon is redundant and contains gas and stool. Similar gas and stool at redundant splenic flexure. Retained low-density stool in the transverse colon. The hepatic flexure is also redundant. Less stool in the right colon. Decompressed cecum and terminal ileum on series 2, image 69. No pneumoperitoneum. Small volume of small bowel mesentery free fluid in the abdomen. Vascular/Lymphatic: Extensive  Aortoiliac calcified atherosclerosis. Vascular patency is not evaluated in the absence of IV contrast. Reproductive: Urethral catheter now in place. Other: New or increased moderate volume fluid in the pelvis appears to be free fluid with simple fluid density (series 2, image 77). This is adjacent to the stool-filled rectum, sigmoid colon, and some nondilated small bowel loops. Musculoskeletal: No acute osseous abnormality identified. IMPRESSION: 1. Small Bowel Obstruction with a transition point in the right pelvis on coronal image 42. This may be due to adhesions. Progressive small bowel bowel dilatation since the CT on 06/19/2019 despite the presence of an NG tube terminating in the stomach. No free air.  Increasing free fluid, now moderate in the pelvis. 2. Increasing layering pleural effusions and new left lower lobe consolidation suspicious for Pneumonia. 3. Aortic Atherosclerosis (ICD10-I70.0). Electronically Signed   By: Genevie Ann M.D.   On: 06/11/2019 00:31   Ct Abdomen Pelvis Wo Contrast  Result Date: 06/01/2019 CLINICAL DATA:  Abdominal pain, fever EXAM: CT ABDOMEN AND PELVIS WITHOUT CONTRAST TECHNIQUE: Multidetector CT imaging of the abdomen and pelvis was performed following the standard protocol without IV contrast. COMPARISON:  09/05/2017 FINDINGS: Lower chest: Moderate left pleural effusion and small right pleural  effusion. Compressive atelectasis in the lower lobes. Heart is normal size. Densely calcified coronary arteries and aorta. Hepatobiliary: No focal hepatic abnormality. Gallbladder unremarkable. Pancreas: No focal abnormality or ductal dilatation. Spleen: No focal abnormality.  Normal size. Adrenals/Urinary Tract: Left adrenal nodules are low-density and similar to prior study, likely adenomas. No visible renal mass. No hydronephrosis. No renal or ureteral stones. Urinary bladder unremarkable. Stomach/Bowel: Large stool burden throughout the colon. There are mildly prominent and dilated  small bowel loops into the pelvis. Distal small bowel loops appear decompressed and grossly unremarkable. Findings are concerning for partial small bowel obstruction. Vascular/Lymphatic: Heavily calcified aorta and iliac vessels. No aneurysm or adenopathy. Reproductive: Mildly prominent prostate. Other: No free fluid or free air. Musculoskeletal: No acute bony abnormality. IMPRESSION: Mildly dilated small bowel loops with distal small bowel loops decompressed. Findings are concerning for partial small bowel obstruction. Exact transition or cause not visualized. Large stool burden throughout the colon. Moderate left pleural effusion and small right pleural effusion. Bibasilar atelectasis. Left adrenal nodules again noted, similar to prior study, likely adenomas. Heavily calcified aorta and iliac vessels. Electronically Signed   By: Rolm Baptise M.D.   On: 05/24/2019 21:43   Ct Chest Wo Contrast  Result Date: 06/02/2019 CLINICAL DATA:  Left-sided chest pain. Shortness of breath. EXAM: CT CHEST WITHOUT CONTRAST TECHNIQUE: Multidetector CT imaging of the chest was performed following the standard protocol without IV contrast. COMPARISON:  Chest x-ray dated 05/25/2019 and CT scan of the abdomen dated 09/05/2017 FINDINGS: Cardiovascular: Aortic atherosclerosis. Coronary artery calcifications. Heart size is normal. No pericardial effusion. Mediastinum/Nodes: No enlarged mediastinal or axillary lymph nodes. Thyroid gland, trachea, and esophagus demonstrate no significant findings. Lungs/Pleura: There are no infiltrates. There is a small left pleural effusion. Tiny right effusion. Minimal secondary atelectasis at the lung bases. The lungs are otherwise clear. Upper Abdomen: There is chronic bilateral adrenal enlargement. Tiny amount of ascites in the left upper quadrant. Musculoskeletal: No chest wall mass or suspicious bone lesions identified. IMPRESSION: 1. Small left and tiny right pleural effusions with secondary  atelectasis at the lung bases. 2. Chronic bilateral adrenal enlargement. 3. Tiny amount of ascites in the left upper quadrant. 4. Aortic Atherosclerosis (ICD10-I70.0). Electronically Signed   By: Lorriane Shire M.D.   On: 06/06/2019 12:14   Nm Pulmonary Perfusion  Result Date: 06/07/2019 CLINICAL DATA:  Chest pain and shortness of breath EXAM: NUCLEAR MEDICINE PERFUSION LUNG SCAN TECHNIQUE: Perfusion images were obtained in multiple projections after intravenous injection of radiopharmaceutical. Ventilation scans intentionally deferred if perfusion scan and chest x-ray adequate for interpretation during COVID 19 epidemic. Views: Anterior, posterior, left lateral, right lateral, RPO, LPO, RAO, LAO RADIOPHARMACEUTICALS:  1.5 mCi Tc-49mMAA IV COMPARISON:  Chest radiograph and chest CT June 09, 2019 FINDINGS: Radiotracer uptake is homogeneous and symmetric bilaterally. No perfusion defects evident. IMPRESSION: No appreciable perfusion defects. Very low probability of pulmonary embolus. Electronically Signed   By: WLowella GripIII M.D.   On: 05/27/2019 15:21   Dg Chest Port 1 View  Result Date: 06/11/2019 CLINICAL DATA:  Respiratory failure EXAM: PORTABLE CHEST 1 VIEW COMPARISON:  06/10/2019 chest radiograph. FINDINGS: Endotracheal tube tip is 5.8 cm above the carina. Enteric tube terminates in the proximal stomach. Stable cardiomediastinal silhouette with normal heart size. No pneumothorax. Stable small left pleural effusion. No right pleural effusion. No pulmonary edema. Patchy left lung base opacity is stable. IMPRESSION: 1. Well-positioned support structures. 2. Stable small left pleural effusion with patchy left lung base  opacity, cannot exclude aspiration or pneumonia. Electronically Signed   By: Ilona Sorrel M.D.   On: 06/11/2019 10:13   Dg Chest Port 1 View  Result Date: 06/10/2019 CLINICAL DATA:  Dyspnea. Shortness of breath. EXAM: PORTABLE CHEST 1 VIEW COMPARISON:  06/05/2019 FINDINGS:  There is a small left pleural effusion. The lungs are otherwise clear. Heart size and vascularity are normal. Aortic atherosclerosis. No acute bone abnormality. IMPRESSION: 1. Small left pleural effusion. 2.  Aortic Atherosclerosis (ICD10-I70.0). 3. No other significant abnormalities. Electronically Signed   By: Lorriane Shire M.D.   On: 06/10/2019 21:12   Dg Chest Port 1v Same Day  Result Date: 06/10/2019 CLINICAL DATA:  Post intubation EXAM: PORTABLE CHEST 1 VIEW COMPARISON:  06/10/2019 FINDINGS: Interval placement of endotracheal tube with the tip 7 cm above the carina. NG tube tip is in the proximal stomach with the side port in the distal esophagus. Continued layering left effusion with left base atelectasis. Right lung clear. Heart is normal size. No acute bony abnormality. IMPRESSION: Endotracheal tube 7 cm above the carina. Continued layering left effusion with left base atelectasis. Electronically Signed   By: Rolm Baptise M.D.   On: 06/10/2019 23:06    Assessment & Plan:  Alexio Sroka is a 73 y.o. male with SBO and multiple system failure with respiratory failure requiring intubation, renal failure that may require dialysis, and HCAP per documentation.  OG in place. He reportedly had a BM 2 days ago.  -OG in place, NPO  -No acute surgical intervention -Monitor abdominal exam -Discussed with this wife and Dr. Manuella Ghazi.   Virl Cagey 06/11/2019, 10:52 AM

## 2019-06-12 ENCOUNTER — Inpatient Hospital Stay (HOSPITAL_COMMUNITY): Payer: Medicare Other

## 2019-06-12 LAB — CBC
HCT: 29.1 % — ABNORMAL LOW (ref 39.0–52.0)
HCT: 29 % — ABNORMAL LOW (ref 39.0–52.0)
Hemoglobin: 9.9 g/dL — ABNORMAL LOW (ref 13.0–17.0)
Hemoglobin: 10 g/dL — ABNORMAL LOW (ref 13.0–17.0)
MCH: 28.8 pg (ref 26.0–34.0)
MCH: 29.2 pg (ref 26.0–34.0)
MCHC: 34 g/dL (ref 30.0–36.0)
MCHC: 34.5 g/dL (ref 30.0–36.0)
MCV: 84.6 fL (ref 80.0–100.0)
MCV: 84.8 fL (ref 80.0–100.0)
Platelets: 166 10*3/uL (ref 150–400)
Platelets: 144 10*3/uL — ABNORMAL LOW (ref 150–400)
RBC: 3.44 MIL/uL — ABNORMAL LOW (ref 4.22–5.81)
RBC: 3.42 MIL/uL — ABNORMAL LOW (ref 4.22–5.81)
RDW: 13.5 % (ref 11.5–15.5)
RDW: 13.6 % (ref 11.5–15.5)
WBC: 6.6 10*3/uL (ref 4.0–10.5)
WBC: 6.8 10*3/uL (ref 4.0–10.5)
nRBC: 0.6 % — ABNORMAL HIGH (ref 0.0–0.2)
nRBC: 0.4 % — ABNORMAL HIGH (ref 0.0–0.2)

## 2019-06-12 LAB — BASIC METABOLIC PANEL
Anion gap: 10 (ref 5–15)
Anion gap: 13 (ref 5–15)
BUN: 73 mg/dL — ABNORMAL HIGH (ref 8–23)
BUN: 72 mg/dL — ABNORMAL HIGH (ref 8–23)
CO2: 20 mmol/L — ABNORMAL LOW (ref 22–32)
CO2: 20 mmol/L — ABNORMAL LOW (ref 22–32)
Calcium: 7 mg/dL — ABNORMAL LOW (ref 8.9–10.3)
Calcium: 6.8 mg/dL — ABNORMAL LOW (ref 8.9–10.3)
Chloride: 104 mmol/L (ref 98–111)
Chloride: 100 mmol/L (ref 98–111)
Creatinine, Ser: 4.99 mg/dL — ABNORMAL HIGH (ref 0.61–1.24)
Creatinine, Ser: 4.89 mg/dL — ABNORMAL HIGH (ref 0.61–1.24)
GFR calc Af Amer: 12 mL/min — ABNORMAL LOW (ref >60)
GFR calc Af Amer: 13 mL/min — ABNORMAL LOW (ref >60)
GFR calc non Af Amer: 11 mL/min — ABNORMAL LOW (ref >60)
GFR calc non Af Amer: 11 mL/min — ABNORMAL LOW (ref >60)
Glucose, Bld: 240 mg/dL — ABNORMAL HIGH (ref 70–99)
Glucose, Bld: 248 mg/dL — ABNORMAL HIGH (ref 70–99)
Potassium: 3.4 mmol/L — ABNORMAL LOW (ref 3.5–5.1)
Potassium: 3.6 mmol/L (ref 3.5–5.1)
Sodium: 134 mmol/L — ABNORMAL LOW (ref 135–145)
Sodium: 133 mmol/L — ABNORMAL LOW (ref 135–145)

## 2019-06-12 LAB — GLUCOSE, CAPILLARY
Glucose-Capillary: 105 mg/dL — ABNORMAL HIGH (ref 70–99)
Glucose-Capillary: 107 mg/dL — ABNORMAL HIGH (ref 70–99)
Glucose-Capillary: 109 mg/dL — ABNORMAL HIGH (ref 70–99)
Glucose-Capillary: 111 mg/dL — ABNORMAL HIGH (ref 70–99)
Glucose-Capillary: 112 mg/dL — ABNORMAL HIGH (ref 70–99)
Glucose-Capillary: 117 mg/dL — ABNORMAL HIGH (ref 70–99)
Glucose-Capillary: 119 mg/dL — ABNORMAL HIGH (ref 70–99)
Glucose-Capillary: 121 mg/dL — ABNORMAL HIGH (ref 70–99)
Glucose-Capillary: 121 mg/dL — ABNORMAL HIGH (ref 70–99)
Glucose-Capillary: 123 mg/dL — ABNORMAL HIGH (ref 70–99)
Glucose-Capillary: 124 mg/dL — ABNORMAL HIGH (ref 70–99)
Glucose-Capillary: 127 mg/dL — ABNORMAL HIGH (ref 70–99)
Glucose-Capillary: 132 mg/dL — ABNORMAL HIGH (ref 70–99)
Glucose-Capillary: 135 mg/dL — ABNORMAL HIGH (ref 70–99)
Glucose-Capillary: 147 mg/dL — ABNORMAL HIGH (ref 70–99)
Glucose-Capillary: 149 mg/dL — ABNORMAL HIGH (ref 70–99)
Glucose-Capillary: 191 mg/dL — ABNORMAL HIGH (ref 70–99)
Glucose-Capillary: 215 mg/dL — ABNORMAL HIGH (ref 70–99)
Glucose-Capillary: 221 mg/dL — ABNORMAL HIGH (ref 70–99)
Glucose-Capillary: 222 mg/dL — ABNORMAL HIGH (ref 70–99)
Glucose-Capillary: 236 mg/dL — ABNORMAL HIGH (ref 70–99)
Glucose-Capillary: 247 mg/dL — ABNORMAL HIGH (ref 70–99)

## 2019-06-12 LAB — BLOOD GAS, ARTERIAL
Acid-base deficit: 2.2 mmol/L — ABNORMAL HIGH (ref 0.0–2.0)
Bicarbonate: 23.3 mmol/L (ref 20.0–28.0)
Drawn by: 35043
FIO2: 90
MECHVT: 549 mL
O2 Saturation: 99.2 %
PEEP: 8 cmH2O
Patient temperature: 37.8
RATE: 20 resp/min
pCO2 arterial: 26.8 mmHg — ABNORMAL LOW (ref 32.0–48.0)
pH, Arterial: 7.498 — ABNORMAL HIGH (ref 7.350–7.450)
pO2, Arterial: 203 mmHg — ABNORMAL HIGH (ref 83.0–108.0)

## 2019-06-12 LAB — CULTURE, BLOOD (ROUTINE X 2)

## 2019-06-12 LAB — BETA-HYDROXYBUTYRIC ACID: Beta-Hydroxybutyric Acid: 0.08 mmol/L (ref 0.05–0.27)

## 2019-06-12 LAB — LACTIC ACID, PLASMA: Lactic Acid, Venous: 2.8 mmol/L (ref 0.5–1.9)

## 2019-06-12 MED ORDER — CALCIUM GLUCONATE-NACL 2-0.675 GM/100ML-% IV SOLN: 2.0000 g | Freq: Once | INTRAVENOUS | Status: DC

## 2019-06-12 MED ORDER — SODIUM BICARBONATE 8.4 % IV SOLN
INTRAVENOUS | Status: AC
  Filled 2019-06-12: qty 150

## 2019-06-12 MED ORDER — LEVALBUTEROL HCL 0.63 MG/3ML IN NEBU
INHALATION_SOLUTION | RESPIRATORY_TRACT | Status: AC
  Administered 2019-06-12: 0.63 mg
  Filled 2019-06-12: qty 3

## 2019-06-12 MED ORDER — CALCIUM GLUCONATE-NACL 1-0.675 GM/50ML-% IV SOLN
1.0000 g | INTRAVENOUS | Status: AC
  Administered 2019-06-12 (×2): 1000 mg via INTRAVENOUS
  Filled 2019-06-12 (×2): qty 50

## 2019-06-12 MED ORDER — IPRATROPIUM BROMIDE 0.02 % IN SOLN
RESPIRATORY_TRACT | Status: AC
  Administered 2019-06-12: 0.5 mg
  Filled 2019-06-12: qty 2.5

## 2019-06-12 NOTE — Progress Notes (Signed)
PROGRESS NOTE    [name] [name]  XLK:[id number] DOB: 05/02/1946 DOA: 06/07/2019 PCP: Ann Held, MD   Brief Narrative:  Per HPI: [name] [name] a 73 y.o.malewith medical history significant ofdiabetes, hypertension, hypertrophic cardiomyopathy, presents to the emergency room with increasing shortness of breath, generalized weakness, left shoulder pain that radiates into his left arm, left neck and down his left torso. This discomfort is worse when he tries to get up and move/lift something with his arm. He has not had any cough. His wife does report him having a fever of 101[place]earlier today. He describes some lower abdominal pain for the past few days. He has had 2-3 bowel movements today, but they were not reported to be loose. He has had no nausea or vomiting. He does occasionally have dysuria.  11/18:Patient will be transitioned off IV insulin drip today as DKA has resolved. Unfortunately, he continues to have elevated heart rates. We will increase metoprolol oral dose which has been started earlier this morning. Will start Cardizem drip after bolus and obtain further evaluation with toxicology as well as TSH. Considering cardiology evaluation by a.m. if he remains with elevated heart rates and/or unstable.  11/19: Patient continues to have significant sinus tachycardia on the afternoon of 11/18 and case was discussed with cardiology Dr. Harl Bowie.  It was recommended that the sinus tachycardia not be suppressed any further as there should be some secondary cause and therefore, Cardizem drip as well as metoprolol were both discontinued.  Unfortunately, patient began to have some worsening tachypnea as well and was ultimately intubated and started back on insulin drip due to worsening DKA.  He had a repeat CT of his abdomen and pelvis demonstrating now a small bowel obstruction with a clear transition point and some worsening dilation in the small bowel loops.  Additionally, he is  now noted to have layering of effusions on the left side of his lung with signs of infiltrate.  He was resumed on vancomycin as well as cefepime.  He has worsening kidney injury and poor urine output noted as well.  He is in critical condition and is currently sedated on the ventilator with FiO2 100% and with propofol and fentanyl for sedation.  General surgery and pulmonology have been consulted for assistance in management.  I will also consult nephrology for assistance in management with acute kidney injury with oliguria as well as acidosis.  11/20: Patient continues to remain on the ventilator this morning.  Discussion was had with family members in the afternoon of 11/19 regarding patient's wishes should he have a cardiopulmonary arrest given his critical condition and overall decline.  They have stated that they would like for him to be DNR.  Additionally, multiple chest x-rays have been reviewed and appears stable with no definitive signs of pneumothorax after central venous line placement.  He continues to have worsening kidney function with little to no urine output.  He was noted to have 1 blood culture bottle with gram-positive cocci with identification and sensitivity still pending.  Vancomycin was discontinued on 11/19 per nephrology recommendations.  Assessment & Plan:   Active Problems:   Hyperlipidemia   Hypertrophic cardiomyopathy ([place])   Hypertension   Type II diabetes mellitus ([place])   Metabolic acidosis   DKA (diabetic ketoacidoses) ([facility])   AKI (acute kidney injury) ([place])   Acute respiratory failure with hypoxia (H[facility])   Abdominal distension   Protein-calorie malnutrition, severe   SBO (small bowel obstruction) (H[facility])   Severe sepsis ([place])  Acute renal failure (H[facility])   Severe sepsis secondary to left-sided HCAP with ongoing fever -Lactic acid levels have been labile, but appear to be downtrending.  We will continue to repeat and monitor -Continue on cefepime for  broad-spectrum coverage and follow-up on blood culture with gram-positive cocci in 1 set.  Vancomycin discontinued 11/19 -Central line placement per general surgery appreciated on 11/19 -MRSA PCR negative on 11/17   Small bowel obstruction -This had progressed from a partial small bowel obstruction noted on initial CT -Patient was tolerating diet yesterday with no nausea or vomiting on 11/18, but had worsening distention prompting CT of the abdomen -Appreciate general surgery ongoing evaluation -Keep n.p.o. and on NG tube to low intermittent suction -Continue IV fluid as ordered per nephrology and monitor electrolytes  DKA in the setting of type 2 diabetes secondary to above processes-ongoing -Insulin drip has been restarted due to worsening hyperglycemia and recurrence of anion gap -Continue to monitor closely and glucose stabilizer protocol -Maintain on IV fluid and keep n.p.o.  Acute respiratory failure secondary to distress/tachypnea with HCAP -Patient currently on the ventilator due to distress related to multiple acute disease processes -Continue ventilator support with FiO2 turned to 40%, reviewed ABG on 11/20 -Chest x-ray appears to demonstrate some pulmonary vascular congestion on my personal review -Appreciate pulmonology ongoing assessment  Worsening AKI with oliguria and acidosis -Continue on IV fluid with bicarbonate with no signs of hydronephrosis noted -Urine electrolytes with FeNa 0.9% showing prerenal causes -Continues to have elevated creatinine levels with little to no urine output noted -Appreciate nephrology evaluation and ongoing recommendations  Ongoing sinus tachycardia due to above factors -Limited 2D echocardiogram performed on 11/19 with EF 50% and no pericardial effusion -2D echocardiogram performed earlier this month with LVEF 60-65% and grade 1 diastolic dysfunction -TSH within normal limits -Does not appear to have withdrawal symptoms  History of  hypertension -Holding home medications to include lisinopril and Lasix  History of hypertrophic cardiomyopathy and CAD with prior PCI -Recent 2D echocardiogram performed on 05/25/2019 as noted above -Follows with Dr. Debara Pickett outpatient  DVT prophylaxis: Heparin Code Status: Full Family Communication:  Discussed with wife at bedside and 2 sons on phone on 11/19 Disposition Plan: Continue on insulin drip as well as cefepime for pneumonia and follow blood culture.  Continue IV fluid and monitor labs carefully.  Appreciate consultation from specialists.  Patient appears to have very poor prognosis with worsening kidney failure.  Discussion had with family on 11/19.   Consultants:   General surgery  Pulmonology  Nephrology  Procedures:   Intubation on 11/18  Right IJ central venous line placement 11/19  Antimicrobials:  Anti-infectives (From admission, onward)   Start     Dose/Rate Route Frequency Ordered Stop   06/11/19 1800  vancomycin (VANCOCIN) 500 mg in sodium chloride 0.9 % 100 mL IVPB  Status:  Discontinued     500 mg 100 mL/hr over 60 Minutes Intravenous Every 48 hours 06/11/19 0112 06/11/19 1202   06/11/19 0200  ceFEPIme (MAXIPIME) 1 g in sodium chloride 0.9 % 100 mL IVPB     1 g 200 mL/hr over 30 Minutes Intravenous Every 24 hours 06/11/19 0112     06/10/19 2000  vancomycin (VANCOCIN) IVPB 750 mg/150 ml premix  Status:  Discontinued     750 mg 150 mL/hr over 60 Minutes Intravenous Every 24 hours 06/12/2019 1826 06/10/19 0743   06/14/2019 2300  piperacillin-tazobactam (ZOSYN) IVPB 3.375 g  Status:  Discontinued  3.375 g 12.5 mL/hr over 240 Minutes Intravenous Every 8 hours 06/01/2019 1826 06/10/19 0743   06/06/2019 1830  vancomycin (VANCOCIN) IVPB 1000 mg/200 mL premix     1,000 mg 200 mL/hr over 60 Minutes Intravenous  Once 06/11/2019 1818 06/22/2019 2102   06/18/2019 1830  piperacillin-tazobactam (ZOSYN) IVPB 3.375 g  Status:  Discontinued     3.375 g 12.5 mL/hr over 240  Minutes Intravenous Every 8 hours 05/24/2019 1818 06/10/19 0743       Subjective: Patient seen and evaluated today with ongoing fever overnight and remains on cooling blanket.  He continues to remain sedated on the ventilator with little to no urine output overnight.  No other acute events noted throughout the evening.  Objective: Vitals:   06/12/19 0530 06/12/19 0600 06/12/19 0628 06/12/19 0630  BP: (!) 143/77 130/72  125/70  Pulse:      Resp: (!) 22 (!) 22  (!) 22  Temp: 99.9 F (37.7 C) 100 F (37.8 C)  100.2 F (37.9 C)  TempSrc:      SpO2: 100%  100%   Weight:      Height:        Intake/Output Summary (Last 24 hours) at 06/12/2019 0707 Last data filed at 06/12/2019 0338 Gross per 24 hour  Intake 4047.37 ml  Output 500 ml  Net 3547.37 ml   Filed Weights   06/10/19 0500 06/11/19 0500 06/12/19 0500  Weight: 62.7 kg 65.8 kg 72.1 kg    Examination:  General exam: Currently sedated on ventilator Respiratory system: Clear to auscultation. Respiratory effort normal.  Currently on ventilator FiO2 40% Cardiovascular system: S1 & S2 heard, RRR, tachycardic. No JVD, murmurs, rubs, gallops or clicks. No pedal edema. Gastrointestinal system: Abdomen is minimally distended, rigid and nontender. No organomegaly or masses felt. Normal bowel sounds heard. Central nervous system: Sedated Extremities: No edema Skin: No rashes, lesions or ulcers Psychiatry: Cannot be assessed    Data Reviewed: I have personally reviewed following labs and imaging studies  CBC: Recent Labs  Lab 06/08/2019 0337 06/10/19 2127 06/11/19 0034  WBC 4.1 7.3 9.5  NEUTROABS 3.5  --   --   HGB 11.7* 14.0 14.2  HCT 35.9* 43.3 43.9  MCV 88.9 88.0 88.7  PLT 211 256 239   Basic Metabolic Panel: Recent Labs  Lab 06/10/19 0425  06/11/19 0034  06/11/19 0933 06/11/19 1253 06/11/19 2015 06/12/19 0007 06/12/19 0352  NA 140   < > 137   < > 138 137 137 134* 133*  K 3.2*   < > 4.3   < > 4.2 3.8 4.1  3.4* 3.6  CL 109   < > 110   < > 108 111 110 104 100  CO2 20*   < > 13*   < > 13* 16* 14* 20* 20*  GLUCOSE 159*   < > 305*   < > 221* 165* 148* 240* 248*  BUN 32*   < > 50*   < > 60* 61* 65* 73* 72*  CREATININE 2.03*   < > 3.48*   < > 4.31* 4.24* 4.44* 4.99* 4.89*  CALCIUM 8.4*   < > 8.3*   < > 8.4* 7.5* 7.4* 7.0* 6.8*  MG 2.1  --  2.0  --   --   --   --   --   --    < > = values in this interval not displayed.   GFR: Estimated Creatinine Clearance: 13.5 mL/min (A) (by C-G formula  based on SCr of 4.89 mg/dL (H)). Liver Function Tests: Recent Labs  Lab 05/25/2019 0337  AST 27  ALT 30  ALKPHOS 66  BILITOT 1.5*  PROT 6.9  ALBUMIN 3.4*   Recent Labs  Lab 06/18/2019 0337  LIPASE 34   No results for input(s): AMMONIA in the last 168 hours. Coagulation Profile: Recent Labs  Lab 06/11/19 0933  INR 1.5*   Cardiac Enzymes: Recent Labs  Lab 06/11/2019 0535  CKTOTAL 89   BNP (last 3 results) No results for input(s): PROBNP in the last 8760 hours. HbA1C: No results for input(s): HGBA1C in the last 72 hours. CBG: Recent Labs  Lab 06/12/19 0216 06/12/19 0317 06/12/19 0417 06/12/19 0548 06/12/19 0643  GLUCAP 222* 215* 221* 236* 191*   Lipid Profile: Recent Labs    06/11/19 0043  TRIG 65   Thyroid Function Tests: Recent Labs    06/10/19 1403  TSH 0.804  FREET4 1.03   Anemia Panel: No results for input(s): VITAMINB12, FOLATE, FERRITIN, TIBC, IRON, RETICCTPCT in the last 72 hours. Sepsis Labs: Recent Labs  Lab 06/11/19 0933 06/11/19 1253 06/11/19 2015 06/12/19 0352  LATICACIDVEN 4.5* 2.4* 5.1* 2.8*    Recent Results (from the past 240 hour(s))  Culture, blood (routine x 2)     Status: None (Preliminary result)   Collection Time: 06/22/2019  3:39 AM   Specimen: BLOOD LEFT HAND  Result Value Ref Range Status   Specimen Description BLOOD LEFT HAND  Final   Special Requests   Final    BOTTLES DRAWN AEROBIC AND ANAEROBIC Blood Culture results may not be optimal  due to an inadequate volume of blood received in culture bottles   Culture   Final    NO GROWTH 2 DAYS Performed at [facility], [address] [place], [place] [address]    Report Status PENDING  Incomplete  Culture, blood (routine x 2)     Status: None (Preliminary result)   Collection Time: 06/12/2019  3:39 AM   Specimen: BLOOD  Result Value Ref Range Status   Specimen Description BLOOD RIGHT ANTECUBITAL  Final   Special Requests   Final    BOTTLES DRAWN AEROBIC AND ANAEROBIC Blood Culture adequate volume   Culture   Final    NO GROWTH 2 DAYS Performed at [facility], [address] [place], [place] [address]    Report Status PENDING  Incomplete  SARS CORONAVIRUS 2 (TAT 6-24 HRS) Nasopharyngeal Nasopharyngeal Swab     Status: None   Collection Time: 06/06/2019  6:48 AM   Specimen: Nasopharyngeal Swab  Result Value Ref Range Status   SARS Coronavirus 2 NEGATIVE NEGATIVE Final    Comment: (NOTE) SARS-CoV-2 target nucleic acids are NOT DETECTED. The SARS-CoV-2 RNA is generally detectable in upper and lower respiratory specimens during the acute phase of infection. Negative results do not preclude SARS-CoV-2 infection, do not rule out co-infections with other pathogens, and should not be used as the sole basis for treatment or other patient management decisions. Negative results must be combined with clinical observations, patient history, and epidemiological information. The expected result is Negative. Fact Sheet for Patients: SugarRoll.be Fact Sheet for Healthcare Providers: https://www.woods-mathews.com/ This test is not yet approved or cleared by the [place] FDA and  has been authorized for detection and/or diagnosis of SARS-CoV-2 by FDA under an Emergency Use Authorization (EUA). This EUA will remain  in effect (meaning this test can be used) for the duration of the COVID-19 declaration under Section 56  4(b)(1) of the Act, 21  U.S.C. section 360bbb-3(b)(1), unless the authorization is terminated or revoked sooner. Performed at [facility], [place] [address], [place], [place] [address]   MRSA PCR Screening     Status: None   Collection Time: 06/18/2019  8:25 PM   Specimen: Nasal Mucosa; Nasopharyngeal  Result Value Ref Range Status   MRSA by PCR NEGATIVE NEGATIVE Final    Comment:        The GeneXpert MRSA Assay (FDA approved for NASAL specimens only), is one component of a comprehensive MRSA colonization surveillance program. It is not intended to diagnose MRSA infection nor to guide or monitor treatment for MRSA infections. Performed at [facility], [address] [place], [place] [address]   Culture, blood (routine x 2)     Status: None (Preliminary result)   Collection Time: 06/11/19 12:34 AM   Specimen: BLOOD LEFT WRIST  Result Value Ref Range Status   Specimen Description BLOOD LEFT WRIST  Final   Special Requests   Final    BOTTLES DRAWN AEROBIC AND ANAEROBIC Blood Culture results may not be optimal due to an inadequate volume of blood received in culture bottles   Culture   Final    NO GROWTH < 12 HOURS Performed at [facility], [address] [address], [place], [place] [address]    Report Status PENDING  Incomplete  Culture, blood (routine x 2)     Status: None (Preliminary result)   Collection Time: 06/11/19 12:43 AM   Specimen: BLOOD RIGHT HAND  Result Value Ref Range Status   Specimen Description BLOOD RIGHT HAND  Final   Special Requests   Final    BOTTLES DRAWN AEROBIC ONLY Blood Culture results may not be optimal due to an inadequate volume of blood received in culture bottles   Culture  Setup Time   Final    GRAM POSITIVE COCCI Gram Stain Report Called to,Read Back By and Verified With: LASHLEY,S ON 06/11/19 AT 1825 BY LOY,C AEROBIC BOTTLE PERFORMED AT [facility] Performed at [facility], [address], [place], [place] [address]    Culture PENDING  Incomplete   Report Status PENDING   Incomplete         Radiology Studies: Ct Abdomen Pelvis Wo Contrast  Result Date: 06/11/2019 CLINICAL DATA:  73 year old male with abdominal pain and distension. Recently intubated. EXAM: CT ABDOMEN AND PELVIS WITHOUT CONTRAST TECHNIQUE: Multidetector CT imaging of the abdomen and pelvis was performed following the standard protocol without IV contrast. COMPARISON:  CT Abdomen and Pelvis 05/26/2019. FINDINGS: Lower chest: Increasing layering pleural effusions and confluent new left lower lobe consolidation with air bronchograms. Right lower lobe atelectasis. Middle lobes remain clear. No cardiomegaly or pericardial effusion. Hepatobiliary: Negative noncontrast liver and gallbladder. Pancreas: Negative noncontrast pancreas. Spleen: Negative aside from a small volume of adjacent free fluid. Adrenals/Urinary Tract: Stable since 2019 adrenal gland thickening such as due to adrenal hyperplasia or benign adenomas. Nonobstructed kidneys. Increased bilateral perinephric soft tissue stranding since 2019 but stable recently. Proximal ureters appear decompressed. No nephrolithiasis. Urinary bladder decompressed by a Foley catheter now. Stomach/Bowel: Enteric tube now in place terminating in the gastric fundus. But there remains moderate gaseous distension of the stomach. The proximal duodenum is also dilated as before. There is oral contrast now in dilated small bowel loops in the left and central abdomen which measure up to 31 millimeters diameter. A gradual transition occurs to decompressed small bowel loops in the pelvis, with the most abrupt transition  point identified on coronal image 42 corresponding to series 2, image 71 where gas-filled loops measuring 2 centimeters transition to fully decompressed distal small bowel loops which are just upstream of the terminal ileum. Stool-filled rectum. Sigmoid colon is redundant and contains gas and stool. Similar gas and stool at redundant splenic flexure. Retained  low-density stool in the transverse colon. The hepatic flexure is also redundant. Less stool in the right colon. Decompressed cecum and terminal ileum on series 2, image 69. No pneumoperitoneum. Small volume of small bowel mesentery free fluid in the abdomen. Vascular/Lymphatic: Extensive Aortoiliac calcified atherosclerosis. Vascular patency is not evaluated in the absence of IV contrast. Reproductive: Urethral catheter now in place. Other: New or increased moderate volume fluid in the pelvis appears to be free fluid with simple fluid density (series 2, image 77). This is adjacent to the stool-filled rectum, sigmoid colon, and some nondilated small bowel loops. Musculoskeletal: No acute osseous abnormality identified. IMPRESSION: 1. Small Bowel Obstruction with a transition point in the right pelvis on coronal image 42. This may be due to adhesions. Progressive small bowel bowel dilatation since the CT on 05/30/2019 despite the presence of an NG tube terminating in the stomach. No free air.  Increasing free fluid, now moderate in the pelvis. 2. Increasing layering pleural effusions and new left lower lobe consolidation suspicious for Pneumonia. 3. Aortic Atherosclerosis (ICD10-I70.0). Electronically Signed   By: Genevie Ann M.D.   On: 06/11/2019 00:31   Dg Chest Port 1 View  Result Date: 06/11/2019 CLINICAL DATA:  Respiratory failure EXAM: PORTABLE CHEST 1 VIEW COMPARISON:  06/10/2019 chest radiograph. FINDINGS: Endotracheal tube tip is 5.8 cm above the carina. Enteric tube terminates in the proximal stomach. Stable cardiomediastinal silhouette with normal heart size. No pneumothorax. Stable small left pleural effusion. No right pleural effusion. No pulmonary edema. Patchy left lung base opacity is stable. IMPRESSION: 1. Well-positioned support structures. 2. Stable small left pleural effusion with patchy left lung base opacity, cannot exclude aspiration or pneumonia. Electronically Signed   By: Ilona Sorrel M.D.    On: 06/11/2019 10:13   Dg Chest Port 1 View  Result Date: 06/10/2019 CLINICAL DATA:  Dyspnea. Shortness of breath. EXAM: PORTABLE CHEST 1 VIEW COMPARISON:  06/10/2019 FINDINGS: There is a small left pleural effusion. The lungs are otherwise clear. Heart size and vascularity are normal. Aortic atherosclerosis. No acute bone abnormality. IMPRESSION: 1. Small left pleural effusion. 2.  Aortic Atherosclerosis (ICD10-I70.0). 3. No other significant abnormalities. Electronically Signed   By: Lorriane Shire M.D.   On: 06/10/2019 21:12   Dg Chest Port 1v Same Day  Result Date: 06/11/2019 CLINICAL DATA:  Follow-up pneumothorax EXAM: PORTABLE CHEST 1 VIEW COMPARISON:  06/11/2019, 06/10/2019 FINDINGS: Endotracheal tube tip is about 5.5 cm superior to the carina. Esophageal tube tip overlies the proximal stomach. Right-sided central venous catheter tip over the cavoatrial region. Stable airspace disease at the left base. Normal heart size. No definitive right pneumothorax is seen IMPRESSION: 1. No definitive right pneumothorax seen. 2. Similar appearance of small left effusion and left basilar airspace disease. Electronically Signed   By: Donavan Foil M.D.   On: 06/11/2019 15:50   Dg Chest Port 1v Same Day  Result Date: 06/11/2019 CLINICAL DATA:  Central line placement. EXAM: PORTABLE CHEST 1 VIEW COMPARISON:  Same day. FINDINGS: The heart size and mediastinal contours are within normal limits. Endotracheal and nasogastric tubes are unchanged in position. Minimal right apical pneumothorax is noted. Right internal jugular catheter is noted  with distal tip in expected position of cavoatrial junction. Stable left basilar atelectasis or infiltrate is noted with associated pleural effusion. The visualized skeletal structures are unremarkable. IMPRESSION: Interval placement of right internal jugular catheter with distal tip in expected position of cavoatrial junction. Minimal right apical pneumothorax is noted. These  results will be called to the ordering clinician or representative by the Radiologist Assistant, and communication documented in the PACS or zVision Dashboard. Stable left basilar atelectasis or infiltrate is noted with associated pleural effusion. Electronically Signed   By: Marijo Conception M.D.   On: 06/11/2019 11:32   Dg Chest Port 1v Same Day  Result Date: 06/10/2019 CLINICAL DATA:  Post intubation EXAM: PORTABLE CHEST 1 VIEW COMPARISON:  06/10/2019 FINDINGS: Interval placement of endotracheal tube with the tip 7 cm above the carina. NG tube tip is in the proximal stomach with the side port in the distal esophagus. Continued layering left effusion with left base atelectasis. Right lung clear. Heart is normal size. No acute bony abnormality. IMPRESSION: Endotracheal tube 7 cm above the carina. Continued layering left effusion with left base atelectasis. Electronically Signed   By: Rolm Baptise M.D.   On: 06/10/2019 23:06        Scheduled Meds:  aspirin EC  81 mg Oral Daily   chlorhexidine gluconate (MEDLINE KIT)  15 mL Mouth Rinse BID   Chlorhexidine Gluconate Cloth  6 each Topical Daily   feeding supplement (GLUCERNA SHAKE)  237 mL Oral TID BM   feeding supplement (PRO-STAT SUGAR FREE 64)  30 mL Oral BID   heparin  5,000 Units Subcutaneous Q8H   mouth rinse  15 mL Mouth Rinse 10 times per day   multivitamin with minerals  1 tablet Oral Daily   succinylcholine  100 mg Intravenous Once   Continuous Infusions:  sodium chloride Stopped (06/11/19 0859)   ceFEPime (MAXIPIME) IV 1 g (06/12/19 0338)   dextrose 5 % and 0.45% NaCl 75 mL/hr at 06/11/19 2002   fentaNYL infusion INTRAVENOUS 50 mcg/hr (06/11/19 1906)   insulin 4.4 Units/hr (06/12/19 0626)   propofol (DIPRIVAN) infusion 35 mcg/kg/min (06/12/19 0010)    sodium bicarbonate (isotonic) infusion in sterile water 150 mL/hr at 06/12/19 0400     LOS: 2 days    Time spent: 30 minutes    Daleah Coulson Darleen Crocker, DO [facility] Pager [phone]  If 7PM-7AM, please contact night-coverage [email or web address] Password [email or web address] 06/12/2019, 7:07 AM

## 2019-06-12 NOTE — Progress Notes (Signed)
Inpatient Diabetes Program Recommendations  AACE/ADA: New Consensus Statement on Inpatient Glycemic Control (2015)  Target Ranges:  Prepandial:   less than 140 mg/dL      Peak postprandial:   less than 180 mg/dL (1-2 hours)      Critically ill patients:  140 - 180 mg/dL   Results for [name] [name], [name] [name] (MRN [id number]) as of 06/12/2019 11:10  Ref. Range 06/12/2019 03:52  Sodium Latest Ref Range: 135 - 145 mmol/L 133 (L)  Potassium Latest Ref Range: 3.5 - 5.1 mmol/L 3.6  Chloride Latest Ref Range: 98 - 111 mmol/L 100  CO2 Latest Ref Range: 22 - 32 mmol/L 20 (L)  Glucose Latest Ref Range: 70 - 99 mg/dL 248 (H)  BUN Latest Ref Range: 8 - 23 mg/dL 72 (H)  Creatinine Latest Ref Range: 0.61 - 1.24 mg/dL 4.89 (H)  Calcium Latest Ref Range: 8.9 - 10.3 mg/dL 6.8 (L)  Anion gap Latest Ref Range: 5 - 67  13    Admit with:DKA (possibly due to empagliflozin)/ Sepsis/ Acute Renal Failure  History:DM, Cardiomyopathy  Home DM Meds:Glyxambi 10/5 mg Daily Glipizide 10 mg daily Metformin 1000 mg BID  Current Orders: IV insulin Drip with EndoTool     Recommend leaving patient on Endotool until transition options unlocked, then follow Hyperglycemic crises transition order set.    Make sure patient receives basal insulin 2 hours prior to discontinuing IV insulin.       --Will follow patient during hospitalization--  Wyn Quaker RN, MSN, CDE Diabetes Coordinator Inpatient Glycemic Control Team Team Pager: [phone] (8a-5p)

## 2019-06-12 NOTE — Progress Notes (Signed)
[facility] ROUNDING NOTE   Subjective:   [name] [name] is a 73 y.o. male.  History of diabetes hypertension hypertrophic cardiomyopathy.  He has a history of coronary artery disease status post PCI LAD April 2009.  Last 2D echo in 2015 showed an ejection fraction of 65% with septal basal hypertrophy no S.A.M. near mid cavitary obstruction.  He was brought to the emergency room 05/28/2019 with fever tachycardia respiratory distress.  Evaluation in the emergency room with chest x-ray CT scan of chest did not show any acute findings no evidence for pneumonia VQ scan low probability of pulmonary embolus.  Found to be febrile.  He was also found to have a metabolic acidosis secondary diabetic ketoacidosis and was started on empiric antibiotics.  Blood pressure 136/76 pulse 98 temperature 98.8 O2 sats 100% FiO2 40%  Urine output 350 cc 06/11/2019.  400 cc through NG tube.  Positive fluid balance about 9 to 10 L since 06/10/2019  Sodium 133 potassium 3.6 chloride 100 CO2 20 BUN 72 creatinine is 4.89 glucose 248 calcium 6.8 WBC 6.6 hemoglobin 9.9 platelets 166 lactic acid 2.8  IV cefepime 1 g every 24 hours  IV sodium chloride 150 cc an hour IV D5 half-normal saline 75 cc an hour   Objective:  Vital signs in last 24 hours:  Temp:  [98.4 F (36.9 C)-102.6 F (39.2 C)] 98.8 F (37.1 C) (11/20 0905) Pulse Rate:  [99-149] 107 (11/20 0423) Resp:  [15-28] 20 (11/20 0905) BP: (100-162)/(55-114) 136/76 (11/20 0905) SpO2:  [100 %] 100 % (11/20 0841) FiO2 (%):  [40 %-90 %] 40 % (11/20 0841) Weight:  [72.1 kg] 72.1 kg (11/20 0500)  Weight change: 6.3 kg Filed Weights   06/10/19 0500 06/11/19 0500 06/12/19 0500  Weight: 62.7 kg 65.8 kg 72.1 kg    Intake/Output: I/O last 3 completed shifts: In: 4883.6 [I.V.:4783.6; IV Piggyback:100] Out: 750 [Urine:350; Emesis/NG output:400]   Intake/Output this shift:  Total I/O In: 885.6 [I.V.:785.6; IV Piggyback:100] Out: -   General:  Sedated on ventilator HEENT: ET tube in place Eyes: Nonicteric equal and reactive pupils Neck: JVP not elevated Heart: No JVP regular rate and rhythm tachycardic no pedal edema Lungs: Mechanically supported breath sounds Abdomen: Soft nontender bowel sounds present hypoactive Extremities: No specific edema Skin: No rash lesions or ulcers Neuro: Sedated on ventilator   Basic Metabolic Panel: Recent Labs  Lab 06/10/19 0425  06/11/19 0034  06/11/19 0933 06/11/19 1253 06/11/19 2015 06/12/19 0007 06/12/19 0352  NA 140   < > 137   < > 138 137 137 134* 133*  K 3.2*   < > 4.3   < > 4.2 3.8 4.1 3.4* 3.6  CL 109   < > 110   < > 108 111 110 104 100  CO2 20*   < > 13*   < > 13* 16* 14* 20* 20*  GLUCOSE 159*   < > 305*   < > 221* 165* 148* 240* 248*  BUN 32*   < > 50*   < > 60* 61* 65* 73* 72*  CREATININE 2.03*   < > 3.48*   < > 4.31* 4.24* 4.44* 4.99* 4.89*  CALCIUM 8.4*   < > 8.3*   < > 8.4* 7.5* 7.4* 7.0* 6.8*  MG 2.1  --  2.0  --   --   --   --   --   --    < > = values in this interval not displayed.  Liver Function Tests: Recent Labs  Lab 06/03/2019 0337  AST 27  ALT 30  ALKPHOS 66  BILITOT 1.5*  PROT 6.9  ALBUMIN 3.4*   Recent Labs  Lab 06/05/2019 0337  LIPASE 34   No results for input(s): AMMONIA in the last 168 hours.  CBC: Recent Labs  Lab 06/17/2019 0337 06/10/19 2127 06/11/19 0034 06/12/19 0352  WBC 4.1 7.3 9.5 6.6  NEUTROABS 3.5  --   --   --   HGB 11.7* 14.0 14.2 9.9*  HCT 35.9* 43.3 43.9 29.1*  MCV 88.9 88.0 88.7 84.6  PLT 211 256 259 166    Cardiac Enzymes: Recent Labs  Lab 06/14/2019 0535  CKTOTAL 89    BNP: Invalid input(s): POCBNP  CBG: Recent Labs  Lab 06/12/19 0417 06/12/19 0548 06/12/19 0643 06/12/19 0750 06/12/19 0854  GLUCAP 221* 236* 191* 149* 147*    Microbiology: Results for orders placed or performed during the hospital encounter of 06/11/2019  Culture, blood (routine x 2)     Status: None (Preliminary result)    Collection Time: 06/16/2019  3:39 AM   Specimen: BLOOD LEFT HAND  Result Value Ref Range Status   Specimen Description BLOOD LEFT HAND  Final   Special Requests   Final    BOTTLES DRAWN AEROBIC AND ANAEROBIC Blood Culture results may not be optimal due to an inadequate volume of blood received in culture bottles   Culture   Final    NO GROWTH 3 DAYS Performed at [facility], [address], [place], [place] [address]    Report Status PENDING  Incomplete  Culture, blood (routine x 2)     Status: None (Preliminary result)   Collection Time: 06/07/2019  3:39 AM   Specimen: BLOOD  Result Value Ref Range Status   Specimen Description BLOOD RIGHT ANTECUBITAL  Final   Special Requests   Final    BOTTLES DRAWN AEROBIC AND ANAEROBIC Blood Culture adequate volume   Culture   Final    NO GROWTH 3 DAYS Performed at [facility], [address], [place], [place] [address]    Report Status PENDING  Incomplete  SARS CORONAVIRUS 2 (TAT 6-24 HRS) Nasopharyngeal Nasopharyngeal Swab     Status: None   Collection Time: 06/18/2019  6:48 AM   Specimen: Nasopharyngeal Swab  Result Value Ref Range Status   SARS Coronavirus 2 NEGATIVE NEGATIVE Final    Comment: (NOTE) SARS-CoV-2 target nucleic acids are NOT DETECTED. The SARS-CoV-2 RNA is generally detectable in upper and lower respiratory specimens during the acute phase of infection. Negative results do not preclude SARS-CoV-2 infection, do not rule out co-infections with other pathogens, and should not be used as the sole basis for treatment or other patient management decisions. Negative results must be combined with clinical observations, patient history, and epidemiological information. The expected result is Negative. Fact Sheet for Patients: SugarRoll.be Fact Sheet for Healthcare Providers: https://www.woods-mathews.com/ This test is not yet approved or cleared by the [place] FDA and  has been  authorized for detection and/or diagnosis of SARS-CoV-2 by FDA under an Emergency Use Authorization (EUA). This EUA will remain  in effect (meaning this test can be used) for the duration of the COVID-19 declaration under Section 56 4(b)(1) of the Act, 21 U.S.C. section 360bbb-3(b)(1), unless the authorization is terminated or revoked sooner. Performed at [facility], [address] [place], [place] [address]   MRSA PCR Screening     Status: None   Collection Time:  06/12/2019  8:25 PM   Specimen: Nasal Mucosa; Nasopharyngeal  Result Value Ref Range Status   MRSA by PCR NEGATIVE NEGATIVE Final    Comment:        The GeneXpert MRSA Assay (FDA approved for NASAL specimens only), is one component of a comprehensive MRSA colonization surveillance program. It is not intended to diagnose MRSA infection nor to guide or monitor treatment for MRSA infections. Performed at [facility], [address], [place], [place] [address]   Culture, blood (routine x 2)     Status: None (Preliminary result)   Collection Time: 06/11/19 12:34 AM   Specimen: BLOOD LEFT WRIST  Result Value Ref Range Status   Specimen Description BLOOD LEFT WRIST  Final   Special Requests   Final    BOTTLES DRAWN AEROBIC AND ANAEROBIC Blood Culture results may not be optimal due to an inadequate volume of blood received in culture bottles   Culture   Final    NO GROWTH 1 DAY Performed at [facility], [address], [place], [place] [address]    Report Status PENDING  Incomplete  Culture, blood (routine x 2)     Status: None (Preliminary result)   Collection Time: 06/11/19 12:43 AM   Specimen: BLOOD RIGHT HAND  Result Value Ref Range Status   Specimen Description BLOOD RIGHT HAND  Final   Special Requests   Final    BOTTLES DRAWN AEROBIC ONLY Blood Culture results may not be optimal due to an inadequate volume of blood received in culture bottles   Culture  Setup Time   Final    GRAM POSITIVE COCCI Gram  Stain Report Called to,Read Back By and Verified With: LASHLEY,S ON 06/11/19 AT 1825 BY LOY,C AEROBIC BOTTLE PERFORMED AT [facility]    Culture   Final    NO GROWTH 1 DAY Performed at [facility], [address] [place], [place] [address]    Report Status PENDING  Incomplete    Coagulation Studies: Recent Labs    06/11/19 0933  LABPROT 17.8*  INR 1.5*    Urinalysis: No results for input(s): COLORURINE, LABSPEC, PHURINE, GLUCOSEU, HGBUR, BILIRUBINUR, KETONESUR, PROTEINUR, UROBILINOGEN, NITRITE, LEUKOCYTESUR in the last 72 hours.  Invalid input(s): APPERANCEUR    Imaging: Ct Abdomen Pelvis Wo Contrast  Result Date: 06/11/2019 CLINICAL DATA:  73 year old male with abdominal pain and distension. Recently intubated. EXAM: CT ABDOMEN AND PELVIS WITHOUT CONTRAST TECHNIQUE: Multidetector CT imaging of the abdomen and pelvis was performed following the standard protocol without IV contrast. COMPARISON:  CT Abdomen and Pelvis 06/22/2019. FINDINGS: Lower chest: Increasing layering pleural effusions and confluent new left lower lobe consolidation with air bronchograms. Right lower lobe atelectasis. Middle lobes remain clear. No cardiomegaly or pericardial effusion. Hepatobiliary: Negative noncontrast liver and gallbladder. Pancreas: Negative noncontrast pancreas. Spleen: Negative aside from a small volume of adjacent free fluid. Adrenals/Urinary Tract: Stable since 2019 adrenal gland thickening such as due to adrenal hyperplasia or benign adenomas. Nonobstructed kidneys. Increased bilateral perinephric soft tissue stranding since 2019 but stable recently. Proximal ureters appear decompressed. No nephrolithiasis. Urinary bladder decompressed by a Foley catheter now. Stomach/Bowel: Enteric tube now in place terminating in the gastric fundus. But there remains moderate gaseous distension of the stomach. The proximal duodenum is also dilated as before. There is oral contrast now in dilated small bowel loops  in the left and central abdomen which measure up to 31 millimeters diameter. A gradual transition occurs to decompressed small bowel loops in the pelvis, with the  most abrupt transition point identified on coronal image 42 corresponding to series 2, image 71 where gas-filled loops measuring 2 centimeters transition to fully decompressed distal small bowel loops which are just upstream of the terminal ileum. Stool-filled rectum. Sigmoid colon is redundant and contains gas and stool. Similar gas and stool at redundant splenic flexure. Retained low-density stool in the transverse colon. The hepatic flexure is also redundant. Less stool in the right colon. Decompressed cecum and terminal ileum on series 2, image 69. No pneumoperitoneum. Small volume of small bowel mesentery free fluid in the abdomen. Vascular/Lymphatic: Extensive Aortoiliac calcified atherosclerosis. Vascular patency is not evaluated in the absence of IV contrast. Reproductive: Urethral catheter now in place. Other: New or increased moderate volume fluid in the pelvis appears to be free fluid with simple fluid density (series 2, image 77). This is adjacent to the stool-filled rectum, sigmoid colon, and some nondilated small bowel loops. Musculoskeletal: No acute osseous abnormality identified. IMPRESSION: 1. Small Bowel Obstruction with a transition point in the right pelvis on coronal image 42. This may be due to adhesions. Progressive small bowel bowel dilatation since the CT on 06/11/2019 despite the presence of an NG tube terminating in the stomach. No free air.  Increasing free fluid, now moderate in the pelvis. 2. Increasing layering pleural effusions and new left lower lobe consolidation suspicious for Pneumonia. 3. Aortic Atherosclerosis (ICD10-I70.0). Electronically Signed   By: Genevie Ann M.D.   On: 06/11/2019 00:31   Dg Chest Port 1 View  Result Date: 06/12/2019 CLINICAL DATA:  Intubation EXAM: PORTABLE CHEST 1 VIEW COMPARISON:  Yesterday  FINDINGS: Endotracheal tube tip at the clavicular heads. The orogastric tube tip reaches the stomach with side port not visible due to underpenetration. Right IJ line with tip at the upper cavoatrial junction. Hazy opacity at the left base which is both pleural fluid and parenchymal. No change from yesterday. Normal heart size and clear right lung. IMPRESSION: 1. Stable hardware positioning. 2. Stable left pleural effusion and pulmonary opacification. Electronically Signed   By: Monte Fantasia M.D.   On: 06/12/2019 07:51   Dg Chest Port 1 View  Result Date: 06/11/2019 CLINICAL DATA:  Respiratory failure EXAM: PORTABLE CHEST 1 VIEW COMPARISON:  06/10/2019 chest radiograph. FINDINGS: Endotracheal tube tip is 5.8 cm above the carina. Enteric tube terminates in the proximal stomach. Stable cardiomediastinal silhouette with normal heart size. No pneumothorax. Stable small left pleural effusion. No right pleural effusion. No pulmonary edema. Patchy left lung base opacity is stable. IMPRESSION: 1. Well-positioned support structures. 2. Stable small left pleural effusion with patchy left lung base opacity, cannot exclude aspiration or pneumonia. Electronically Signed   By: Ilona Sorrel M.D.   On: 06/11/2019 10:13   Dg Chest Port 1 View  Result Date: 06/10/2019 CLINICAL DATA:  Dyspnea. Shortness of breath. EXAM: PORTABLE CHEST 1 VIEW COMPARISON:  06/22/2019 FINDINGS: There is a small left pleural effusion. The lungs are otherwise clear. Heart size and vascularity are normal. Aortic atherosclerosis. No acute bone abnormality. IMPRESSION: 1. Small left pleural effusion. 2.  Aortic Atherosclerosis (ICD10-I70.0). 3. No other significant abnormalities. Electronically Signed   By: Lorriane Shire M.D.   On: 06/10/2019 21:12   Dg Chest Port 1v Same Day  Result Date: 06/11/2019 CLINICAL DATA:  Follow-up pneumothorax EXAM: PORTABLE CHEST 1 VIEW COMPARISON:  06/11/2019, 06/10/2019 FINDINGS: Endotracheal tube tip is  about 5.5 cm superior to the carina. Esophageal tube tip overlies the proximal stomach. Right-sided central venous catheter tip over  the cavoatrial region. Stable airspace disease at the left base. Normal heart size. No definitive right pneumothorax is seen IMPRESSION: 1. No definitive right pneumothorax seen. 2. Similar appearance of small left effusion and left basilar airspace disease. Electronically Signed   By: Donavan Foil M.D.   On: 06/11/2019 15:50   Dg Chest Port 1v Same Day  Result Date: 06/11/2019 CLINICAL DATA:  Central line placement. EXAM: PORTABLE CHEST 1 VIEW COMPARISON:  Same day. FINDINGS: The heart size and mediastinal contours are within normal limits. Endotracheal and nasogastric tubes are unchanged in position. Minimal right apical pneumothorax is noted. Right internal jugular catheter is noted with distal tip in expected position of cavoatrial junction. Stable left basilar atelectasis or infiltrate is noted with associated pleural effusion. The visualized skeletal structures are unremarkable. IMPRESSION: Interval placement of right internal jugular catheter with distal tip in expected position of cavoatrial junction. Minimal right apical pneumothorax is noted. These results will be called to the ordering clinician or representative by the Radiologist Assistant, and communication documented in the PACS or zVision Dashboard. Stable left basilar atelectasis or infiltrate is noted with associated pleural effusion. Electronically Signed   By: Marijo Conception M.D.   On: 06/11/2019 11:32   Dg Chest Port 1v Same Day  Result Date: 06/10/2019 CLINICAL DATA:  Post intubation EXAM: PORTABLE CHEST 1 VIEW COMPARISON:  06/10/2019 FINDINGS: Interval placement of endotracheal tube with the tip 7 cm above the carina. NG tube tip is in the proximal stomach with the side port in the distal esophagus. Continued layering left effusion with left base atelectasis. Right lung clear. Heart is normal size. No  acute bony abnormality. IMPRESSION: Endotracheal tube 7 cm above the carina. Continued layering left effusion with left base atelectasis. Electronically Signed   By: Rolm Baptise M.D.   On: 06/10/2019 23:06     Medications:   . sodium chloride Stopped (06/11/19 0859)  . ceFEPime (MAXIPIME) IV Stopped (06/12/19 0409)  . dextrose 5 % and 0.45% NaCl 75 mL/hr at 06/12/19 0753  . fentaNYL infusion INTRAVENOUS 50 mcg/hr (06/12/19 0753)  . insulin 0.8 mL/hr at 06/12/19 0753  . propofol (DIPRIVAN) infusion 35 mcg/kg/min (06/12/19 0806)  .  sodium bicarbonate (isotonic) infusion in sterile water 150 mL/hr at 06/12/19 0754   . aspirin EC  81 mg Oral Daily  . chlorhexidine gluconate (MEDLINE KIT)  15 mL Mouth Rinse BID  . Chlorhexidine Gluconate Cloth  6 each Topical Daily  . feeding supplement (GLUCERNA SHAKE)  237 mL Oral TID BM  . feeding supplement (PRO-STAT SUGAR FREE 64)  30 mL Oral BID  . heparin  5,000 Units Subcutaneous Q8H  . mouth rinse  15 mL Mouth Rinse 10 times per day  . multivitamin with minerals  1 tablet Oral Daily  . succinylcholine  100 mg Intravenous Once   acetaminophen, dextrose, fentaNYL (SUBLIMAZE) injection, labetalol, ondansetron (ZOFRAN) IV  Assessment/ Plan:  1.Acute kidney injury supposedly baseline serum creatinine within the normal range.  Patient was on ACE inhibitor as outpatient came in with acute kidney injury creatinine increased to 1.6.  Does not appear to have had hemodynamic instability based on record.  Abdominal CT did not reveal any evidence of hydronephrosis.  Urine did not reveal any evidence of cellular activity.  Fina 0.9%.  No administration of nephrotoxins no IV contrast or intra-arterial procedures.  Appears that this is most likely secondary to acute tubular necrosis.  Patient did receive vancomycin.    This has been  discontinued 06/11/2019. Has small bowel obstruction and is probably third spacing.  Agree with IV fluids for rehydration and support.   Continues to be having dwindling urine output with increasing creatinine.  Patient now DNR.  No plans to escalate care. 2.  Metabolic acidosis patient was on Metformin this has been discontinued since hospitalization.  IV bicarbonate has been administered 3.  Acute respiratory distress with presumed healthcare associated pneumonia.  Continues on ventilator support followed by Dr. Velvet Bathe appreciate assistance from Dr. Luan Pulling. 4.  DKA insulin drip maintain IV fluids 5.  Small bowel obstruction evaluated by general surgery n.p.o. NG tube with intermittent suction IV fluids will be needed. 6.  Ongoing sinus tachycardia continue IV fluids.  Appreciate assistance from cardiology.  TSH within normal range 7.  History of hypertrophic cardiomyopathy and coronary artery disease with prior PCI.  2D echo 38/18/2993 LVH with diastolic filling that is intermittent 8.  Pneumonia/severe sepsis with HCAP.  Would recommend switching vancomycin to something less nephrotoxic. 9.  Hypocalcemia would replete 2 g IV calcium gluconate 10.  Positive gram-positive cocci 1 out of 2 bottles.  Continues on cefepime.  ID pending   LOS: [place] _0 _1 :33 AM

## 2019-06-12 NOTE — Progress Notes (Signed)
Subjective: He remains intubated and on the ventilator.  He was admitted with DKA which improved but had persistent tachycardia.  Etiology of the tachycardia was unclear.  He Developed increasing tachypnea and shortness of breath on 1119 and ended up having to be intubated and placed on mechanical ventilation.  He has remained on the ventilator since.  He had CT of the abdomen and pelvis that showed a small bowel obstruction had left lower lobe pneumonia pleural effusions.  Since that time he has had significant kidney injury and has been oliguric he has central line.  He is on a bicarb drip.  His blood sugars have been better controlled   objective: Vital signs in last 24 hours: Temp:  [98.4 F (36.9 C)-102.6 F (39.2 C)] 100.1 F (37.8 C) (11/20 0700) Pulse Rate:  [99-149] 107 (11/20 0423) Resp:  [15-28] 22 (11/20 0630) BP: (100-162)/(55-114) 125/70 (11/20 0630) SpO2:  [100 %] 100 % (11/20 0628) FiO2 (%):  [40 %-100 %] 40 % (11/20 0628) Weight:  [72.1 kg] 72.1 kg (11/20 0500) Weight change: 6.3 kg Last BM Date: 06/11/19  Intake/Output from previous day: 11/19 0701 - 11/20 0700 In: 4047.4 [I.V.:4047.4] Out: 500 [Urine:100; Emesis/NG output:400]  PHYSICAL EXAM General appearance: Intubated sedated on mechanical ventilation Resp: rales bilaterally and rhonchi bilaterally Cardio: regular rate and rhythm, S1, S2 normal, no murmur, click, rub or gallop GI: He has decreased bowel sounds Extremities: extremities normal, atraumatic, no cyanosis or edema  Lab Results:  Results for orders placed or performed during the hospital encounter of 05/30/2019 (from the past 48 hour(s))  Glucose, capillary     Status: Abnormal   Collection Time: 06/10/19  8:28 AM  Result Value Ref Range   Glucose-Capillary 105 (H) 70 - 99 mg/dL  Basic metabolic panel     Status: Abnormal   Collection Time: 06/10/19  8:52 AM  Result Value Ref Range   Sodium 140 135 - 145 mmol/L   Potassium 4.4 3.5 - 5.1 mmol/L     Comment: DELTA CHECK NOTED   Chloride 106 98 - 111 mmol/L   CO2 19 (L) 22 - 32 mmol/L   Glucose, Bld 83 70 - 99 mg/dL   BUN 35 (H) 8 - 23 mg/dL   Creatinine, Ser 2.34 (H) 0.61 - 1.24 mg/dL   Calcium 8.6 (L) 8.9 - 10.3 mg/dL   GFR calc non Af Amer 26 (L) >60 mL/min   GFR calc Af Amer 31 (L) >60 mL/min   Anion gap 15 5 - 15    Comment: Performed at [facility], [address], [place], [place] [address]  Beta-hydroxybutyric acid     Status: None   Collection Time: 06/10/19  8:52 AM  Result Value Ref Range   Beta-Hydroxybutyric Acid 0.11 0.05 - 0.27 mmol/L    Comment: Performed at [facility], [address] [place]  [address]  Glucose, capillary     Status: Abnormal   Collection Time: 06/10/19 11:35 AM  Result Value Ref Range   Glucose-Capillary 112 (H) 70 - 99 mg/dL  Basic metabolic panel     Status: Abnormal   Collection Time: 06/10/19 12:38 PM  Result Value Ref Range   Sodium 139 135 - 145 mmol/L   Potassium 4.2 3.5 - 5.1 mmol/L   Chloride 105 98 - 111 mmol/L   CO2 18 (L) 22 - 32 mmol/L   Glucose, Bld 133 (H) 70 - 99 mg/dL   BUN 38 (H) 8 - 23 mg/dL  Creatinine, Ser 2.66 (H) 0.61 - 1.24 mg/dL   Calcium 8.4 (L) 8.9 - 10.3 mg/dL   GFR calc non Af Amer 23 (L) >60 mL/min   GFR calc Af Amer 26 (L) >60 mL/min   Anion gap 16 (H) 5 - 15    Comment: Performed at [facility], [address], [place], [place] [address]  TSH     Status: None   Collection Time: 06/10/19  2:03 PM  Result Value Ref Range   TSH 0.804 0.350 - 4.500 uIU/mL    Comment: Performed by a 3rd Generation assay with a functional sensitivity of <=0.01 uIU/mL. Performed at [facility], [address] [place], [place] [address]   T4, free     Status: None   Collection Time: 06/10/19  2:03 PM  Result Value Ref Range   Free T4 1.03 0.61 - 1.12 ng/dL    Comment: (NOTE) Biotin ingestion may interfere with free T4 tests. If the results are inconsistent with the TSH level, previous test results, or  the clinical presentation, then consider biotin interference. If needed, order repeat testing after stopping biotin. Performed at [facility], [place] [address], [place], [place] [address]   Drug Profile, Ur, 9 Drugs     Status: None   Collection Time: 06/10/19  3:00 PM  Result Value Ref Range   Amphetamines, Urine Negative Cutoff=1000 ng/mL    Comment: Amphetamine test includes Amphetamine and Methamphetamine.   Barbiturate, Ur Negative Cutoff=300 ng/mL   Benzodiazepine Quant, Ur Negative Cutoff=300 ng/mL   Cannabinoid Quant, Ur Negative Cutoff=50 ng/mL   Cocaine (Metab.) Negative Cutoff=300 ng/mL   Opiate Quant, Ur Negative Cutoff=300 ng/mL    Comment: Opiate test includes Codeine and Morphine only.   Phencyclidine, Ur Negative Cutoff=25 ng/mL   Methadone Screen, Urine Negative Cutoff=300 ng/mL   Propoxyphene, Urine Negative Cutoff=300 ng/mL    Comment: (NOTE) Performed At: [facility] [address] [place], [place] [id number] Avis Epley PhD EH:[id number]   Sodium, urine, random     Status: None   Collection Time: 06/10/19  3:00 PM  Result Value Ref Range   Sodium, Ur 31 mmol/L    Comment: Performed at [facility], [address] [place], [place] [address]  Creatinine, urine, random     Status: None   Collection Time: 06/10/19  3:00 PM  Result Value Ref Range   Creatinine, Urine 62.69 mg/dL    Comment: Performed at [facility], [address], [place], [place] [address]  Glucose, capillary     Status: Abnormal   Collection Time: 06/10/19  5:06 PM  Result Value Ref Range   Glucose-Capillary 237 (H) 70 - 99 mg/dL  Blood gas, arterial     Status: Abnormal   Collection Time: 06/10/19  8:55 PM  Result Value Ref Range   FIO2 30.00    pH, Arterial 7.450 7.350 - 7.450   pCO2 arterial 20.1 (L) 32.0 - 48.0 mmHg   pO2, Arterial 60.4 (L) 83.0 - 108.0 mmHg   Bicarbonate 17.9 (L) 20.0 - 28.0 mmol/L   Acid-base deficit 9.6 (H) 0.0 - 2.0 mmol/L   O2 Saturation 91.2 %    Patient temperature 37.0    Allens test (pass/fail) PASS PASS    Comment: Performed at [facility], [address], [place], [place] [address]  Glucose, capillary     Status: Abnormal   Collection Time: 06/10/19  9:14 PM  Result Value Ref Range   Glucose-Capillary 276 (H) 70 -  99 mg/dL  Basic metabolic panel     Status: Abnormal   Collection Time: 06/10/19  9:27 PM  Result Value Ref Range   Sodium 134 (L) 135 - 145 mmol/L   Potassium 4.6 3.5 - 5.1 mmol/L   Chloride 107 98 - 111 mmol/L   CO2 13 (L) 22 - 32 mmol/L   Glucose, Bld 313 (H) 70 - 99 mg/dL   BUN 48 (H) 8 - 23 mg/dL   Creatinine, Ser 3.27 (H) 0.61 - 1.24 mg/dL   Calcium 8.0 (L) 8.9 - 10.3 mg/dL   GFR calc non Af Amer 18 (L) >60 mL/min   GFR calc Af Amer 20 (L) >60 mL/min   Anion gap 14 5 - 15    Comment: Performed at [facility], [address], [place], [place] [address]  CBC     Status: Abnormal   Collection Time: 06/10/19  9:27 PM  Result Value Ref Range   WBC 7.3 4.0 - 10.5 K/uL   RBC 4.92 4.22 - 5.81 MIL/uL   Hemoglobin 14.0 13.0 - 17.0 g/dL   HCT 43.3 39.0 - 52.0 %   MCV 88.0 80.0 - 100.0 fL   MCH 28.5 26.0 - 34.0 pg   MCHC 32.3 30.0 - 36.0 g/dL   RDW 13.2 11.5 - 15.5 %   Platelets 256 150 - 400 K/uL   nRBC 0.6 (H) 0.0 - 0.2 %    Comment: Performed at [facility], [address] [place], [place] [address]  Troponin I (High Sensitivity)     Status: Abnormal   Collection Time: 06/10/19  9:27 PM  Result Value Ref Range   Troponin I (High Sensitivity) 41 (H) <18 ng/L    Comment: DELTA CHECK NOTED Performed at [facility], [address], [place], [place] [address]   Glucose, capillary     Status: Abnormal   Collection Time: 06/10/19 11:40 PM  Result Value Ref Range   Glucose-Capillary 294 (H) 70 - 99 mg/dL  Blood gas, arterial     Status: Abnormal   Collection Time: 06/10/19 11:54 PM  Result Value Ref Range   FIO2 100.00    Delivery systems VENTILATOR    pH, Arterial 7.403 7.350 - 7.450   pCO2 arterial  22.3 (L) 32.0 - 48.0 mmHg   pO2, Arterial 136 (H) 83.0 - 108.0 mmHg   Bicarbonate 17.4 (L) 20.0 - 28.0 mmol/L   Acid-base deficit 10.2 (H) 0.0 - 2.0 mmol/L   O2 Saturation 98.3 %   Patient temperature 38.9    Drawn by [id number]    Allens test (pass/fail) PASS PASS    Comment: Performed at [facility], [address] [place], [place] [address]  AMBMP     Status: Abnormal   Collection Time: 06/11/19 12:34 AM  Result Value Ref Range   Sodium 137 135 - 145 mmol/L   Potassium 4.3 3.5 - 5.1 mmol/L   Chloride 110 98 - 111 mmol/L   CO2 13 (L) 22 - 32 mmol/L   Glucose, Bld 305 (H) 70 - 99 mg/dL   BUN 50 (H) 8 - 23 mg/dL   Creatinine, Ser 3.48 (H) 0.61 - 1.24 mg/dL   Calcium 8.3 (L) 8.9 - 10.3 mg/dL   GFR calc non Af Amer 16 (L) >60 mL/min   GFR calc Af Amer 19 (L) >60 mL/min   Anion gap 14 5 - 15    Comment: Performed at [facility], [address] [place], [place] [address]  [facility]  Status: Abnormal   Collection Time: 06/11/19 12:34 AM  Result Value Ref Range   WBC 9.5 4.0 - 10.5 K/uL   RBC 4.95 4.22 - 5.81 MIL/uL   Hemoglobin 14.2 13.0 - 17.0 g/dL   HCT 43.9 39.0 - 52.0 %   MCV 88.7 80.0 - 100.0 fL   MCH 28.7 26.0 - 34.0 pg   MCHC 32.3 30.0 - 36.0 g/dL   RDW 13.3 11.5 - 15.5 %   Platelets 259 150 - 400 K/uL   nRBC 0.6 (H) 0.0 - 0.2 %    Comment: Performed at [facility], [address] [place], [place] [address]  Magnesium     Status: None   Collection Time: 06/11/19 12:34 AM  Result Value Ref Range   Magnesium 2.0 1.7 - 2.4 mg/dL    Comment: Performed at [facility], [address] [place], [place] [address]  Culture, blood (routine x 2)     Status: None (Preliminary result)   Collection Time: 06/11/19 12:34 AM   Specimen: BLOOD LEFT WRIST  Result Value Ref Range   Specimen Description BLOOD LEFT WRIST    Special Requests      BOTTLES DRAWN AEROBIC AND ANAEROBIC Blood Culture results may not be optimal due to an inadequate volume of blood received in culture bottles    Culture      NO GROWTH 1 DAY Performed at [facility], [address], [place], [place] [address]    Report Status PENDING   Culture, blood (routine x 2)     Status: None (Preliminary result)   Collection Time: 06/11/19 12:43 AM   Specimen: BLOOD RIGHT HAND  Result Value Ref Range   Specimen Description BLOOD RIGHT HAND    Special Requests      BOTTLES DRAWN AEROBIC ONLY Blood Culture results may not be optimal due to an inadequate volume of blood received in culture bottles   Culture  Setup Time      GRAM POSITIVE COCCI Gram Stain Report Called to,Read Back By and Verified With: LASHLEY,S ON 06/11/19 AT 1825 BY LOY,C AEROBIC BOTTLE PERFORMED AT [facility]    Culture      NO GROWTH 1 DAY Performed at [facility], [address], [place], [place] [address]    Report Status PENDING   Triglycerides     Status: None   Collection Time: 06/11/19 12:43 AM  Result Value Ref Range   Triglycerides 65 <150 mg/dL    Comment: Performed at [facility], [address] [place], [place] [address]  Strep pneumoniae urinary antigen     Status: None   Collection Time: 06/11/19  1:10 AM  Result Value Ref Range   Strep Pneumo Urinary Antigen NEGATIVE NEGATIVE    Comment:        Infection due to S. pneumoniae cannot be absolutely ruled out since the antigen present may be below the detection limit of the test. Performed at [facility], [address] [place], [place] [address]   Glucose, capillary     Status: Abnormal   Collection Time: 06/11/19  4:52 AM  Result Value Ref Range   Glucose-Capillary 313 (H) 70 - 99 mg/dL  Glucose, capillary     Status: Abnormal   Collection Time: 06/11/19  5:47 AM  Result Value Ref Range   Glucose-Capillary 289 (H) 70 - 99 mg/dL  Basic metabolic panel     Status: Abnormal   Collection Time: 06/11/19  6:14 AM  Result Value Ref Range  Sodium 135 135 - 145 mmol/L   Potassium 4.5 3.5 - 5.1 mmol/L   Chloride 108 98 - 111 mmol/L   CO2 15 (L) 22 - 32 mmol/L    Glucose, Bld 319 (H) 70 - 99 mg/dL   BUN 56 (H) 8 - 23 mg/dL   Creatinine, Ser 3.90 (H) 0.61 - 1.24 mg/dL   Calcium 8.0 (L) 8.9 - 10.3 mg/dL   GFR calc non Af Amer 14 (L) >60 mL/min   GFR calc Af Amer 17 (L) >60 mL/min   Anion gap 12 5 - 15    Comment: Performed at [facility], [address], [place], [place] [address]  Beta-hydroxybutyric acid     Status: Abnormal   Collection Time: 06/11/19  6:14 AM  Result Value Ref Range   Beta-Hydroxybutyric Acid 0.77 (H) 0.05 - 0.27 mmol/L    Comment: Performed at [facility], [address] [place], [place] [address]  Glucose, capillary     Status: Abnormal   Collection Time: 06/11/19  6:50 AM  Result Value Ref Range   Glucose-Capillary 307 (H) 70 - 99 mg/dL  Glucose, capillary     Status: Abnormal   Collection Time: 06/11/19  7:40 AM  Result Value Ref Range   Glucose-Capillary 240 (H) 70 - 99 mg/dL  Blood gas, arterial today     Status: Abnormal   Collection Time: 06/11/19  8:50 AM  Result Value Ref Range   FIO2 100.00    pH, Arterial 7.396 7.350 - 7.450   pCO2 arterial 23.0 (L) 32.0 - 48.0 mmHg   pO2, Arterial 102 83.0 - 108.0 mmHg   Bicarbonate 17.2 (L) 20.0 - 28.0 mmol/L   Acid-base deficit 10.2 (H) 0.0 - 2.0 mmol/L   O2 Saturation 97.3 %   Patient temperature 39.2    Allens test (pass/fail) PASS PASS    Comment: Performed at [facility], [address], [place], [place] [address]  Glucose, capillary     Status: Abnormal   Collection Time: 06/11/19  8:55 AM  Result Value Ref Range   Glucose-Capillary 210 (H) 70 - 99 mg/dL  Basic metabolic panel     Status: Abnormal   Collection Time: 06/11/19  9:33 AM  Result Value Ref Range   Sodium 138 135 - 145 mmol/L   Potassium 4.2 3.5 - 5.1 mmol/L   Chloride 108 98 - 111 mmol/L   CO2 13 (L) 22 - 32 mmol/L   Glucose, Bld 221 (H) 70 - 99 mg/dL   BUN 60 (H) 8 - 23 mg/dL   Creatinine, Ser 4.31 (H) 0.61 - 1.24 mg/dL   Calcium 8.4 (L) 8.9 - 10.3 mg/dL   GFR calc non Af Amer 13 (L) >60  mL/min   GFR calc Af Amer 15 (L) >60 mL/min   Anion gap 17 (H) 5 - 15    Comment: Performed at [facility], [address] [place], [place] [address]  Lactic acid, plasma     Status: Abnormal   Collection Time: 06/11/19  9:33 AM  Result Value Ref Range   Lactic Acid, Venous 4.5 (HH) 0.5 - 1.9 mmol/L    Comment: CRITICAL RESULT CALLED TO, READ BACK BY AND VERIFIED WITH: SHELTON,A AT 10:10AM ON 06/11/19 BY [facility] Performed at [facility], [address] [place],  [address]   Protime-INR     Status: Abnormal   Collection Time: 06/11/19  9:33 AM  Result Value Ref Range   Prothrombin Time 17.8 (H) 11.4 - 15.2  seconds   INR 1.5 (H) 0.8 - 1.2    Comment: (NOTE) INR goal varies based on device and disease states. Performed at [facility], [address] [place], [place] [address]   Glucose, capillary     Status: Abnormal   Collection Time: 06/11/19  9:59 AM  Result Value Ref Range   Glucose-Capillary 195 (H) 70 - 99 mg/dL  Glucose, capillary     Status: Abnormal   Collection Time: 06/11/19 10:55 AM  Result Value Ref Range   Glucose-Capillary 157 (H) 70 - 99 mg/dL  Glucose, capillary     Status: Abnormal   Collection Time: 06/11/19 12:01 PM  Result Value Ref Range   Glucose-Capillary 153 (H) 70 - 99 mg/dL  Basic metabolic panel     Status: Abnormal   Collection Time: 06/11/19 12:53 PM  Result Value Ref Range   Sodium 137 135 - 145 mmol/L   Potassium 3.8 3.5 - 5.1 mmol/L   Chloride 111 98 - 111 mmol/L   CO2 16 (L) 22 - 32 mmol/L   Glucose, Bld 165 (H) 70 - 99 mg/dL   BUN 61 (H) 8 - 23 mg/dL   Creatinine, Ser 4.24 (H) 0.61 - 1.24 mg/dL   Calcium 7.5 (L) 8.9 - 10.3 mg/dL   GFR calc non Af Amer 13 (L) >60 mL/min   GFR calc Af Amer 15 (L) >60 mL/min   Anion gap 10 5 - 15    Comment: Performed at [name], [address], [place], [place] [address]  Beta-hydroxybutyric acid     Status: None   Collection Time: 06/11/19 12:53 PM  Result Value Ref Range    Beta-Hydroxybutyric Acid 0.08 0.05 - 0.27 mmol/L    Comment: Performed at [facility], [address] [place], [place] [address]  Lactic acid, plasma     Status: Abnormal   Collection Time: 06/11/19 12:53 PM  Result Value Ref Range   Lactic Acid, Venous 2.4 (HH) 0.5 - 1.9 mmol/L    Comment: CRITICAL VALUE NOTED.  VALUE IS CONSISTENT WITH PREVIOUSLY REPORTED AND CALLED VALUE. Performed at [facility], [address] [place], [place] [address]   Glucose, capillary     Status: Abnormal   Collection Time: 06/11/19  1:09 PM  Result Value Ref Range   Glucose-Capillary 140 (H) 70 - 99 mg/dL  Glucose, capillary     Status: Abnormal   Collection Time: 06/11/19  1:59 PM  Result Value Ref Range   Glucose-Capillary 120 (H) 70 - 99 mg/dL  Glucose, capillary     Status: Abnormal   Collection Time: 06/11/19  2:51 PM  Result Value Ref Range   Glucose-Capillary 140 (H) 70 - 99 mg/dL  Glucose, capillary     Status: Abnormal   Collection Time: 06/11/19  4:08 PM  Result Value Ref Range   Glucose-Capillary 137 (H) 70 - 99 mg/dL  Glucose, capillary     Status: Abnormal   Collection Time: 06/11/19  4:53 PM  Result Value Ref Range   Glucose-Capillary 209 (H) 70 - 99 mg/dL  Glucose, capillary     Status: Abnormal   Collection Time: 06/11/19  5:44 PM  Result Value Ref Range   Glucose-Capillary 142 (H) 70 - 99 mg/dL  Glucose, capillary     Status: Abnormal   Collection Time: 06/11/19  6:53 PM  Result Value Ref Range   Glucose-Capillary 157 (H) 70 - 99 mg/dL  Glucose, capillary     Status: Abnormal   Collection  Time: 06/11/19  7:55 PM  Result Value Ref Range   Glucose-Capillary 137 (H) 70 - 99 mg/dL  Basic metabolic panel     Status: Abnormal   Collection Time: 06/11/19  8:15 PM  Result Value Ref Range   Sodium 137 135 - 145 mmol/L   Potassium 4.1 3.5 - 5.1 mmol/L   Chloride 110 98 - 111 mmol/L   CO2 14 (L) 22 - 32 mmol/L   Glucose, Bld 148 (H) 70 - 99 mg/dL   BUN 65 (H) 8 - 23 mg/dL    Creatinine, Ser 4.44 (H) 0.61 - 1.24 mg/dL   Calcium 7.4 (L) 8.9 - 10.3 mg/dL   GFR calc non Af Amer 12 (L) >60 mL/min   GFR calc Af Amer 14 (L) >60 mL/min   Anion gap 13 5 - 15    Comment: Performed at [facility], [address] [place], [place] [address]  Lactic acid, plasma     Status: Abnormal   Collection Time: 06/11/19  8:15 PM  Result Value Ref Range   Lactic Acid, Venous 5.1 (HH) 0.5 - 1.9 mmol/L    Comment: CRITICAL VALUE NOTED.  VALUE IS CONSISTENT WITH PREVIOUSLY REPORTED AND CALLED VALUE. Performed at [facility], [address] [place], [place] [address]   Glucose, capillary     Status: Abnormal   Collection Time: 06/11/19  9:05 PM  Result Value Ref Range   Glucose-Capillary 170 (H) 70 - 99 mg/dL  Glucose, capillary     Status: Abnormal   Collection Time: 06/11/19 10:04 PM  Result Value Ref Range   Glucose-Capillary 201 (H) 70 - 99 mg/dL  Glucose, capillary     Status: Abnormal   Collection Time: 06/11/19 11:05 PM  Result Value Ref Range   Glucose-Capillary 193 (H) 70 - 99 mg/dL  Basic metabolic panel     Status: Abnormal   Collection Time: 06/12/19 12:07 AM  Result Value Ref Range   Sodium 134 (L) 135 - 145 mmol/L   Potassium 3.4 (L) 3.5 - 5.1 mmol/L    Comment: DELTA CHECK NOTED   Chloride 104 98 - 111 mmol/L   CO2 20 (L) 22 - 32 mmol/L   Glucose, Bld 240 (H) 70 - 99 mg/dL   BUN 73 (H) 8 - 23 mg/dL   Creatinine, Ser 4.99 (H) 0.61 - 1.24 mg/dL   Calcium 7.0 (L) 8.9 - 10.3 mg/dL   GFR calc non Af Amer 11 (L) >60 mL/min   GFR calc Af Amer 12 (L) >60 mL/min   Anion gap 10 5 - 15    Comment: Performed at [facility], [address], [place], [place] [address]  Beta-hydroxybutyric acid     Status: None   Collection Time: 06/12/19 12:07 AM  Result Value Ref Range   Beta-Hydroxybutyric Acid 0.08 0.05 - 0.27 mmol/L    Comment: Performed at [facility], [address], [place], [place] [address]  Glucose, capillary     Status: Abnormal   Collection Time:  06/12/19  1:11 AM  Result Value Ref Range   Glucose-Capillary 247 (H) 70 - 99 mg/dL  Glucose, capillary     Status: Abnormal   Collection Time: 06/12/19  2:16 AM  Result Value Ref Range   Glucose-Capillary 222 (H) 70 - 99 mg/dL  Glucose, capillary     Status: Abnormal   Collection Time: 06/12/19  3:17 AM  Result Value Ref Range   Glucose-Capillary 215 (H) 70 - 99 mg/dL  AMBMP  Status: Abnormal   Collection Time: 06/12/19  3:52 AM  Result Value Ref Range   Sodium 133 (L) 135 - 145 mmol/L   Potassium 3.6 3.5 - 5.1 mmol/L   Chloride 100 98 - 111 mmol/L   CO2 20 (L) 22 - 32 mmol/L   Glucose, Bld 248 (H) 70 - 99 mg/dL   BUN 72 (H) 8 - 23 mg/dL   Creatinine, Ser 4.89 (H) 0.61 - 1.24 mg/dL   Calcium 6.8 (L) 8.9 - 10.3 mg/dL   GFR calc non Af Amer 11 (L) >60 mL/min   GFR calc Af Amer 13 (L) >60 mL/min   Anion gap 13 5 - 15    Comment: Performed at [facility], [address], [place], [place] [address]  Lactic acid, plasma     Status: Abnormal   Collection Time: 06/12/19  3:52 AM  Result Value Ref Range   Lactic Acid, Venous 2.8 (HH) 0.5 - 1.9 mmol/L    Comment: CRITICAL VALUE NOTED.  VALUE IS CONSISTENT WITH PREVIOUSLY REPORTED AND CALLED VALUE. Performed at [facility], [address] [place], [place] [address]   Glucose, capillary     Status: Abnormal   Collection Time: 06/12/19  4:17 AM  Result Value Ref Range   Glucose-Capillary 221 (H) 70 - 99 mg/dL  Blood gas, arterial today     Status: Abnormal   Collection Time: 06/12/19  4:53 AM  Result Value Ref Range   FIO2 90.00    Delivery systems VENTILATOR    Mode PRESSURE REGULATED VOLUME CONTROL    VT 549 mL   LHR 20 resp/min   Peep/cpap 8.0 cm H20   pH, Arterial 7.498 (H) 7.350 - 7.450   pCO2 arterial 26.8 (L) 32.0 - 48.0 mmHg   pO2, Arterial 203 (H) 83.0 - 108.0 mmHg   Bicarbonate 23.3 20.0 - 28.0 mmol/L   Acid-base deficit 2.2 (H) 0.0 - 2.0 mmol/L   O2 Saturation 99.2 %   Patient temperature 37.8    Collection  site LEFT RADIAL    Drawn by [id number]    Allens test (pass/fail) PASS PASS    Comment: Performed at [facility], [address], [place], [place] [address]  Glucose, capillary     Status: Abnormal   Collection Time: 06/12/19  5:48 AM  Result Value Ref Range   Glucose-Capillary 236 (H) 70 - 99 mg/dL  Glucose, capillary     Status: Abnormal   Collection Time: 06/12/19  6:43 AM  Result Value Ref Range   Glucose-Capillary 191 (H) 70 - 99 mg/dL  Glucose, capillary     Status: Abnormal   Collection Time: 06/12/19  7:50 AM  Result Value Ref Range   Glucose-Capillary 149 (H) 70 - 99 mg/dL    ABGS Recent Labs    06/12/19 0453  PHART 7.498*  PO2ART 203*  HCO3 23.3   CULTURES Recent Results (from the past 240 hour(s))  Culture, blood (routine x 2)     Status: None (Preliminary result)   Collection Time: 06/20/2019  3:39 AM   Specimen: BLOOD LEFT HAND  Result Value Ref Range Status   Specimen Description BLOOD LEFT HAND  Final   Special Requests   Final    BOTTLES DRAWN AEROBIC AND ANAEROBIC Blood Culture results may not be optimal due to an inadequate volume of blood received in culture bottles   Culture   Final    NO GROWTH 3 DAYS Performed at [facility], [address] [place],  [place] [address]    Report Status PENDING  Incomplete  Culture, blood (routine x 2)     Status: None (Preliminary result)   Collection Time: 06/14/2019  3:39 AM   Specimen: BLOOD  Result Value Ref Range Status   Specimen Description BLOOD RIGHT ANTECUBITAL  Final   Special Requests   Final    BOTTLES DRAWN AEROBIC AND ANAEROBIC Blood Culture adequate volume   Culture   Final    NO GROWTH 3 DAYS Performed at [facility], [address], [place], [place] [address]    Report Status PENDING  Incomplete  SARS CORONAVIRUS 2 (TAT 6-24 HRS) Nasopharyngeal Nasopharyngeal Swab     Status: None   Collection Time: 06/10/2019  6:48 AM   Specimen: Nasopharyngeal Swab  Result Value Ref Range Status   SARS Coronavirus  2 NEGATIVE NEGATIVE Final    Comment: (NOTE) SARS-CoV-2 target nucleic acids are NOT DETECTED. The SARS-CoV-2 RNA is generally detectable in upper and lower respiratory specimens during the acute phase of infection. Negative results do not preclude SARS-CoV-2 infection, do not rule out co-infections with other pathogens, and should not be used as the sole basis for treatment or other patient management decisions. Negative results must be combined with clinical observations, patient history, and epidemiological information. The expected result is Negative. Fact Sheet for Patients: SugarRoll.be Fact Sheet for Healthcare Providers: https://www.woods-mathews.com/ This test is not yet approved or cleared by the [place] FDA and  has been authorized for detection and/or diagnosis of SARS-CoV-2 by FDA under an Emergency Use Authorization (EUA). This EUA will remain  in effect (meaning this test can be used) for the duration of the COVID-19 declaration under Section 56 4(b)(1) of the Act, 21 U.S.C. section 360bbb-3(b)(1), unless the authorization is terminated or revoked sooner. Performed at [facility], [place] [address], [place], [place] [address]   MRSA PCR Screening     Status: None   Collection Time: 06/17/2019  8:25 PM   Specimen: Nasal Mucosa; Nasopharyngeal  Result Value Ref Range Status   MRSA by PCR NEGATIVE NEGATIVE Final    Comment:        The GeneXpert MRSA Assay (FDA approved for NASAL specimens only), is one component of a comprehensive MRSA colonization surveillance program. It is not intended to diagnose MRSA infection nor to guide or monitor treatment for MRSA infections. Performed at [facility], [address], [place], [place] [address]   Culture, blood (routine x 2)     Status: None (Preliminary result)   Collection Time: 06/11/19 12:34 AM   Specimen: BLOOD LEFT WRIST  Result Value Ref Range Status   Specimen  Description BLOOD LEFT WRIST  Final   Special Requests   Final    BOTTLES DRAWN AEROBIC AND ANAEROBIC Blood Culture results may not be optimal due to an inadequate volume of blood received in culture bottles   Culture   Final    NO GROWTH 1 DAY Performed at [facility], [address], [place], [place] [address]    Report Status PENDING  Incomplete  Culture, blood (routine x 2)     Status: None (Preliminary result)   Collection Time: 06/11/19 12:43 AM   Specimen: BLOOD RIGHT HAND  Result Value Ref Range Status   Specimen Description BLOOD RIGHT HAND  Final   Special Requests   Final    BOTTLES DRAWN AEROBIC ONLY Blood Culture results may not be optimal due to an inadequate volume of blood received in culture  bottles   Culture  Setup Time   Final    GRAM POSITIVE COCCI Gram Stain Report Called to,Read Back By and Verified With: LASHLEY,S ON 06/11/19 AT 1825 BY LOY,C AEROBIC BOTTLE PERFORMED AT [facility]    Culture   Final    NO GROWTH 1 DAY Performed at [facility], [address] [place], [place] [address]    Report Status PENDING  Incomplete   Studies/Results: Ct Abdomen Pelvis Wo Contrast  Result Date: 06/11/2019 CLINICAL DATA:  73 year old male with abdominal pain and distension. Recently intubated. EXAM: CT ABDOMEN AND PELVIS WITHOUT CONTRAST TECHNIQUE: Multidetector CT imaging of the abdomen and pelvis was performed following the standard protocol without IV contrast. COMPARISON:  CT Abdomen and Pelvis 06/01/2019. FINDINGS: Lower chest: Increasing layering pleural effusions and confluent new left lower lobe consolidation with air bronchograms. Right lower lobe atelectasis. Middle lobes remain clear. No cardiomegaly or pericardial effusion. Hepatobiliary: Negative noncontrast liver and gallbladder. Pancreas: Negative noncontrast pancreas. Spleen: Negative aside from a small volume of adjacent free fluid. Adrenals/Urinary Tract: Stable since 2019 adrenal gland thickening such as due to  adrenal hyperplasia or benign adenomas. Nonobstructed kidneys. Increased bilateral perinephric soft tissue stranding since 2019 but stable recently. Proximal ureters appear decompressed. No nephrolithiasis. Urinary bladder decompressed by a Foley catheter now. Stomach/Bowel: Enteric tube now in place terminating in the gastric fundus. But there remains moderate gaseous distension of the stomach. The proximal duodenum is also dilated as before. There is oral contrast now in dilated small bowel loops in the left and central abdomen which measure up to 31 millimeters diameter. A gradual transition occurs to decompressed small bowel loops in the pelvis, with the most abrupt transition point identified on coronal image 42 corresponding to series 2, image 71 where gas-filled loops measuring 2 centimeters transition to fully decompressed distal small bowel loops which are just upstream of the terminal ileum. Stool-filled rectum. Sigmoid colon is redundant and contains gas and stool. Similar gas and stool at redundant splenic flexure. Retained low-density stool in the transverse colon. The hepatic flexure is also redundant. Less stool in the right colon. Decompressed cecum and terminal ileum on series 2, image 69. No pneumoperitoneum. Small volume of small bowel mesentery free fluid in the abdomen. Vascular/Lymphatic: Extensive Aortoiliac calcified atherosclerosis. Vascular patency is not evaluated in the absence of IV contrast. Reproductive: Urethral catheter now in place. Other: New or increased moderate volume fluid in the pelvis appears to be free fluid with simple fluid density (series 2, image 77). This is adjacent to the stool-filled rectum, sigmoid colon, and some nondilated small bowel loops. Musculoskeletal: No acute osseous abnormality identified. IMPRESSION: 1. Small Bowel Obstruction with a transition point in the right pelvis on coronal image 42. This may be due to adhesions. Progressive small bowel bowel  dilatation since the CT on 06/01/2019 despite the presence of an NG tube terminating in the stomach. No free air.  Increasing free fluid, now moderate in the pelvis. 2. Increasing layering pleural effusions and new left lower lobe consolidation suspicious for Pneumonia. 3. Aortic Atherosclerosis (ICD10-I70.0). Electronically Signed   By: Genevie Ann M.D.   On: 06/11/2019 00:31   Dg Chest Port 1 View  Result Date: 06/12/2019 CLINICAL DATA:  Intubation EXAM: PORTABLE CHEST 1 VIEW COMPARISON:  Yesterday FINDINGS: Endotracheal tube tip at the clavicular heads. The orogastric tube tip reaches the stomach with side port not visible due to underpenetration. Right IJ line with tip at the upper cavoatrial junction. Hazy opacity at  the left base which is both pleural fluid and parenchymal. No change from yesterday. Normal heart size and clear right lung. IMPRESSION: 1. Stable hardware positioning. 2. Stable left pleural effusion and pulmonary opacification. Electronically Signed   By: Monte Fantasia M.D.   On: 06/12/2019 07:51   Dg Chest Port 1 View  Result Date: 06/11/2019 CLINICAL DATA:  Respiratory failure EXAM: PORTABLE CHEST 1 VIEW COMPARISON:  06/10/2019 chest radiograph. FINDINGS: Endotracheal tube tip is 5.8 cm above the carina. Enteric tube terminates in the proximal stomach. Stable cardiomediastinal silhouette with normal heart size. No pneumothorax. Stable small left pleural effusion. No right pleural effusion. No pulmonary edema. Patchy left lung base opacity is stable. IMPRESSION: 1. Well-positioned support structures. 2. Stable small left pleural effusion with patchy left lung base opacity, cannot exclude aspiration or pneumonia. Electronically Signed   By: Ilona Sorrel M.D.   On: 06/11/2019 10:13   Dg Chest Port 1 View  Result Date: 06/10/2019 CLINICAL DATA:  Dyspnea. Shortness of breath. EXAM: PORTABLE CHEST 1 VIEW COMPARISON:  05/24/2019 FINDINGS: There is a small left pleural effusion. The lungs  are otherwise clear. Heart size and vascularity are normal. Aortic atherosclerosis. No acute bone abnormality. IMPRESSION: 1. Small left pleural effusion. 2.  Aortic Atherosclerosis (ICD10-I70.0). 3. No other significant abnormalities. Electronically Signed   By: Lorriane Shire M.D.   On: 06/10/2019 21:12   Dg Chest Port 1v Same Day  Result Date: 06/11/2019 CLINICAL DATA:  Follow-up pneumothorax EXAM: PORTABLE CHEST 1 VIEW COMPARISON:  06/11/2019, 06/10/2019 FINDINGS: Endotracheal tube tip is about 5.5 cm superior to the carina. Esophageal tube tip overlies the proximal stomach. Right-sided central venous catheter tip over the cavoatrial region. Stable airspace disease at the left base. Normal heart size. No definitive right pneumothorax is seen IMPRESSION: 1. No definitive right pneumothorax seen. 2. Similar appearance of small left effusion and left basilar airspace disease. Electronically Signed   By: Donavan Foil M.D.   On: 06/11/2019 15:50   Dg Chest Port 1v Same Day  Result Date: 06/11/2019 CLINICAL DATA:  Central line placement. EXAM: PORTABLE CHEST 1 VIEW COMPARISON:  Same day. FINDINGS: The heart size and mediastinal contours are within normal limits. Endotracheal and nasogastric tubes are unchanged in position. Minimal right apical pneumothorax is noted. Right internal jugular catheter is noted with distal tip in expected position of cavoatrial junction. Stable left basilar atelectasis or infiltrate is noted with associated pleural effusion. The visualized skeletal structures are unremarkable. IMPRESSION: Interval placement of right internal jugular catheter with distal tip in expected position of cavoatrial junction. Minimal right apical pneumothorax is noted. These results will be called to the ordering clinician or representative by the Radiologist Assistant, and communication documented in the PACS or zVision Dashboard. Stable left basilar atelectasis or infiltrate is noted with associated  pleural effusion. Electronically Signed   By: Marijo Conception M.D.   On: 06/11/2019 11:32   Dg Chest Port 1v Same Day  Result Date: 06/10/2019 CLINICAL DATA:  Post intubation EXAM: PORTABLE CHEST 1 VIEW COMPARISON:  06/10/2019 FINDINGS: Interval placement of endotracheal tube with the tip 7 cm above the carina. NG tube tip is in the proximal stomach with the side port in the distal esophagus. Continued layering left effusion with left base atelectasis. Right lung clear. Heart is normal size. No acute bony abnormality. IMPRESSION: Endotracheal tube 7 cm above the carina. Continued layering left effusion with left base atelectasis. Electronically Signed   By: Rolm Baptise M.D.  On: 06/10/2019 23:06    Medications:  Prior to Admission:  Medications Prior to Admission  Medication Sig Dispense Refill Last Dose  . amLODipine (NORVASC) 5 MG tablet TAKE ONE (1) TABLET EACH DAY 90 tablet 1 06/08/2019 at Unknown time  . aspirin EC 81 MG tablet Take 81 mg by mouth daily.   06/08/2019 at 900  . Empagliflozin-linaGLIPtin (GLYXAMBI) 10-5 MG TABS Take 1 tablet by mouth daily.   06/08/2019 at Unknown time  . ferrous sulfate 325 (65 FE) MG tablet Take 1 tablet (325 mg total) by mouth 2 (two) times daily with a meal. 60 tablet 1   . fosinopril (MONOPRIL) 40 MG tablet TAKE ONE (1) TABLET EACH DAY 90 tablet 2 06/08/2019 at Unknown time  . glipiZIDE (GLUCOTROL) 10 MG tablet Take 1 tablet by mouth daily.   06/08/2019 at Unknown time  . metFORMIN (GLUCOPHAGE) 1000 MG tablet TAKE ONE TABLET BY MOUTH TWICE DAILY 60 tablet 0 06/08/2019 at Unknown time  . Multiple Vitamins-Minerals (MULTIVITAMIN PO) Take by mouth daily.   06/08/2019 at Unknown time  . pantoprazole (PROTONIX) 40 MG tablet Take 1 tablet (40 mg total) by mouth daily before breakfast. 90 tablet 3 06/08/2019 at Unknown time  . rosuvastatin (CRESTOR) 10 MG tablet Take 10 mg by mouth daily.   06/08/2019 at Unknown time   Scheduled: . aspirin EC  81 mg Oral  Daily  . chlorhexidine gluconate (MEDLINE KIT)  15 mL Mouth Rinse BID  . Chlorhexidine Gluconate Cloth  6 each Topical Daily  . feeding supplement (GLUCERNA SHAKE)  237 mL Oral TID BM  . feeding supplement (PRO-STAT SUGAR FREE 64)  30 mL Oral BID  . heparin  5,000 Units Subcutaneous Q8H  . mouth rinse  15 mL Mouth Rinse 10 times per day  . multivitamin with minerals  1 tablet Oral Daily  . succinylcholine  100 mg Intravenous Once   Continuous: . sodium chloride Stopped (06/11/19 0859)  . ceFEPime (MAXIPIME) IV Stopped (06/12/19 0409)  . dextrose 5 % and 0.45% NaCl 75 mL/hr at 06/12/19 0753  . fentaNYL infusion INTRAVENOUS 50 mcg/hr (06/12/19 0753)  . insulin 0.8 mL/hr at 06/12/19 0753  . propofol (DIPRIVAN) infusion 35 mcg/kg/min (06/12/19 0806)  .  sodium bicarbonate (isotonic) infusion in sterile water 150 mL/hr at 06/12/19 0754   YYQ:[id number], dextrose, fentaNYL (SUBLIMAZE) injection, labetalol, ondansetron (ZOFRAN) IV  Assesment: He was admitted with diabetic ketoacidosis.  This was treated and resolved but he continued to have tachycardia eventually developed tachypnea as well increasing shortness of breath all of which culminated in him being intubated and placed on the ventilator.  He was found to have a small bowel obstruction and pneumonia.  He has had severe sepsis.  His lactate level seems to be trending back down now he remains on the ventilator.  He has metabolic acidemia likely from his acute renal failure  He has small bowel obstruction being treated conservatively  He has protein calorie malnutrition  He has acute hypoxic respiratory failure requiring ventilator support and his blood gas is adequate on 90% oxygen  He had one blood culture positive with gram-positive cocci but identification is still pending.  There was concern about pneumothorax after central venous line placement but I do not see any evidence of that on latest chest x-ray which I have personally  reviewed  He remains critically ill.  Family has had discussion with Dr. Manuella Ghazi and he has DNR status now Active Problems:   Hyperlipidemia  Hypertrophic cardiomyopathy ([place])   Hypertension   Type II diabetes mellitus ([place])   Metabolic acidosis   DKA (diabetic ketoacidoses) ([facility])   AKI (acute kidney injury) ([place])   Acute respiratory failure with hypoxia ([facility])   Abdominal distension   Protein-calorie malnutrition, severe   SBO (small bowel obstruction) ([facility])   Severe sepsis ([place])   Acute renal failure ([place])    Plan: Continue current treatments.  Prognosis is poor    LOS: 2 days   Alonza Bogus 06/12/2019, 8:03 AM

## 2019-06-12 NOTE — Progress Notes (Signed)
[facility]  RN and Dr. Manuella Ghazi report family opting for comfort measures starting tomorrow. Patient had multiple BMs today per RN report. No plans for any surgical intervention. Will sign off.  Curlene Labrum, MD [facility] [address] [place], [place] [address] [phone]/ [phone] (office)

## 2019-06-13 LAB — COMPREHENSIVE METABOLIC PANEL
ALT: 1160 U/L — ABNORMAL HIGH (ref 0–44)
AST: 6046 U/L — ABNORMAL HIGH (ref 15–41)
Albumin: 1.7 g/dL — ABNORMAL LOW (ref 3.5–5.0)
Alkaline Phosphatase: 636 U/L — ABNORMAL HIGH (ref 38–126)
Anion gap: 18 — ABNORMAL HIGH (ref 5–15)
BUN: 87 mg/dL — ABNORMAL HIGH (ref 8–23)
CO2: 21 mmol/L — ABNORMAL LOW (ref 22–32)
Calcium: 6.8 mg/dL — ABNORMAL LOW (ref 8.9–10.3)
Chloride: 94 mmol/L — ABNORMAL LOW (ref 98–111)
Creatinine, Ser: 5.89 mg/dL — ABNORMAL HIGH (ref 0.61–1.24)
GFR calc Af Amer: 10 mL/min — ABNORMAL LOW (ref >60)
GFR calc non Af Amer: 9 mL/min — ABNORMAL LOW (ref >60)
Glucose, Bld: 127 mg/dL — ABNORMAL HIGH (ref 70–99)
Potassium: 3.6 mmol/L (ref 3.5–5.1)
Sodium: 133 mmol/L — ABNORMAL LOW (ref 135–145)
Total Bilirubin: 1.9 mg/dL — ABNORMAL HIGH (ref 0.3–1.2)
Total Protein: 4.6 g/dL — ABNORMAL LOW (ref 6.5–8.1)

## 2019-06-13 LAB — BLOOD GAS, ARTERIAL
Acid-Base Excess: 0.8 mmol/L (ref 0.0–2.0)
Bicarbonate: 25.7 mmol/L (ref 20.0–28.0)
FIO2: 35
O2 Saturation: 97.7 %
Patient temperature: 37
pCO2 arterial: 25.4 mmHg — ABNORMAL LOW (ref 32.0–48.0)
pH, Arterial: 7.564 — ABNORMAL HIGH (ref 7.350–7.450)
pO2, Arterial: 93.5 mmHg (ref 83.0–108.0)

## 2019-06-13 LAB — CBC
HCT: 25.5 % — ABNORMAL LOW (ref 39.0–52.0)
Hemoglobin: 9.1 g/dL — ABNORMAL LOW (ref 13.0–17.0)
MCH: 29.9 pg (ref 26.0–34.0)
MCHC: 35.7 g/dL (ref 30.0–36.0)
MCV: 83.9 fL (ref 80.0–100.0)
Platelets: 109 10*3/uL — ABNORMAL LOW (ref 150–400)
RBC: 3.04 MIL/uL — ABNORMAL LOW (ref 4.22–5.81)
RDW: 13.5 % (ref 11.5–15.5)
WBC: 10.2 10*3/uL (ref 4.0–10.5)
nRBC: 0.7 % — ABNORMAL HIGH (ref 0.0–0.2)

## 2019-06-13 LAB — GLUCOSE, CAPILLARY
Glucose-Capillary: 114 mg/dL — ABNORMAL HIGH (ref 70–99)
Glucose-Capillary: 116 mg/dL — ABNORMAL HIGH (ref 70–99)
Glucose-Capillary: 117 mg/dL — ABNORMAL HIGH (ref 70–99)
Glucose-Capillary: 117 mg/dL — ABNORMAL HIGH (ref 70–99)
Glucose-Capillary: 122 mg/dL — ABNORMAL HIGH (ref 70–99)
Glucose-Capillary: 128 mg/dL — ABNORMAL HIGH (ref 70–99)
Glucose-Capillary: 128 mg/dL — ABNORMAL HIGH (ref 70–99)
Glucose-Capillary: 133 mg/dL — ABNORMAL HIGH (ref 70–99)
Glucose-Capillary: 137 mg/dL — ABNORMAL HIGH (ref 70–99)

## 2019-06-13 LAB — LACTIC ACID, PLASMA: Lactic Acid, Venous: 3.8 mmol/L (ref 0.5–1.9)

## 2019-06-13 MED ORDER — LORAZEPAM 2 MG/ML IJ SOLN: 1.0000 mg | INTRAMUSCULAR | Status: DC | PRN

## 2019-06-13 MED ORDER — MORPHINE SULFATE (PF) 2 MG/ML IV SOLN
2.0000 mg | INTRAVENOUS | Status: DC | PRN
  Administered 2019-06-13 – 2019-06-14 (×6): 2 mg via INTRAVENOUS
  Filled 2019-06-13 (×6): qty 1

## 2019-06-13 MED ORDER — IPRATROPIUM BROMIDE 0.02 % IN SOLN
0.5000 mg | Freq: Four times a day (QID) | RESPIRATORY_TRACT | Status: DC | PRN
  Administered 2019-06-13: 0.5 mg via RESPIRATORY_TRACT

## 2019-06-13 MED ORDER — LEVALBUTEROL HCL 0.63 MG/3ML IN NEBU
0.6300 mg | INHALATION_SOLUTION | Freq: Four times a day (QID) | RESPIRATORY_TRACT | Status: DC | PRN
  Administered 2019-06-13: 0.63 mg via RESPIRATORY_TRACT

## 2019-06-13 MED ORDER — PANTOPRAZOLE SODIUM 40 MG IV SOLR
40.0000 mg | INTRAVENOUS | Status: DC
  Administered 2019-06-13: 40 mg via INTRAVENOUS
  Filled 2019-06-13: qty 40

## 2019-06-13 MED ORDER — LEVALBUTEROL HCL 0.63 MG/3ML IN NEBU
INHALATION_SOLUTION | RESPIRATORY_TRACT | Status: AC
  Filled 2019-06-13: qty 3

## 2019-06-13 MED ORDER — IPRATROPIUM BROMIDE 0.02 % IN SOLN
RESPIRATORY_TRACT | Status: AC
  Filled 2019-06-13: qty 2.5

## 2019-06-13 NOTE — Progress Notes (Signed)
06/13/2019  Referred to patient and family by [facility] this afternoon. Patient compassionately withdrawn from care today with family, spouse and two sons bedside. Chaplain provided spiritual support and prayer prior to extubation and coordinated care with interdisciplinary care team. Chaplain provided continued support after withdrawal. Family members are caring for each other, very close, and are utilizing their internal spiritual resources well. Will continue to remain available as needed or requested for spiritual support.

## 2019-06-13 NOTE — Progress Notes (Signed)
Patient wheezes are resolving, Peak pressures are down from 35 to 26. Prn xopenex /atrovent order in place.

## 2019-06-13 NOTE — Progress Notes (Signed)
PROGRESS NOTE    [name] [name]  VEH:[id number] DOB: 1946/07/01 DOA: 05/26/2019 PCP: Ann Held, MD   Brief Narrative:  Per HPI: [name] Daltonis a 73 y.o.malewith medical history significant ofdiabetes, hypertension, hypertrophic cardiomyopathy, presents to the emergency room with increasing shortness of breath, generalized weakness, left shoulder pain that radiates into his left arm, left neck and down his left torso. This discomfort is worse when he tries to get up and move/lift something with his arm. He has not had any cough. His wife does report him having a fever of 101[place]lier today. He describes some lower abdominal pain for the past few days. He has had 2-3 bowel movements today, but they were not reported to be loose. He has had no nausea or vomiting. He does occasionally have dysuria.  11/18:Patient will be transitioned off IV insulin drip today as DKA has resolved. Unfortunately, he continues to have elevated heart rates. We will increase metoprolol oral dose which has been started earlier this morning. Will start Cardizem drip after bolus and obtain further evaluation with toxicology as well as TSH. Considering cardiology evaluation by a.m. if he remains with elevated heart rates and/or unstable.  11/19:Patient continues to have significant sinus tachycardia on the afternoon of 11/18 and case was discussed with cardiology Dr. Harl Bowie. It was recommended that the sinus tachycardia not be suppressed any further as there should be some secondary cause and therefore, Cardizem drip as well as metoprolol were both discontinued. Unfortunately, patient began to have some worsening tachypnea as well and was ultimately intubated and started back on insulin drip due to worsening DKA. He had a repeat CT of his abdomen and pelvis demonstrating now a small bowel obstruction with a clear transition point and some worsening dilation in the small bowel loops. Additionally, he is  now noted to have layering of effusions on the left side of his lung with signs of infiltrate. He was resumed on vancomycin as well as cefepime. He has worsening kidney injury and poor urine output noted as well. He is in critical condition and is currently sedated on the ventilator with FiO2 100% and with propofol and fentanyl for sedation. General surgery and pulmonology have been consulted for assistance in management. I will also consult nephrology for assistance in management with acute kidney injury with oliguria as well as acidosis.  11/20: Patient continues to remain on the ventilator this morning.  Discussion was had with family members in the afternoon of 11/19 regarding patient's wishes should he have a cardiopulmonary arrest given his critical condition and overall decline.  They have stated that they would like for him to be DNR.  Additionally, multiple chest x-rays have been reviewed and appears stable with no definitive signs of pneumothorax after central venous line placement.  He continues to have worsening kidney function with little to no urine output.  He was noted to have 1 blood culture bottle with gram-positive cocci with identification and sensitivity still pending.  Vancomycin was discontinued on 11/19 per nephrology recommendations.  11/21: Patient has been terminally extubated to comfort measures around 1500 today. Family members at bedside.  Assessment & Plan:   Active Problems:   Hyperlipidemia   Hypertrophic cardiomyopathy ([place])   Hypertension   Type II diabetes mellitus ([place])   Metabolic acidosis   DKA (diabetic ketoacidoses) ([facility])   AKI (acute kidney injury) ([place])   Acute respiratory failure with hypoxia ([facility])   Abdominal distension   Protein-calorie malnutrition, severe   SBO (small bowel obstruction) (  [place])   Severe sepsis ([place])   Acute renal failure ([facility])   Severe sepsis secondary to left-sided HCAP with ongoing fever  Small bowel  obstruction  Liver shock secondary to sepsis  DKA in the setting of type 2 diabetes secondary to above processes-ongoing  Acute respiratory failure secondary to distress/tachypnea with HCAP  Worsening AKI with oliguria and acidosis  Ongoing sinus tachycardia due to above factors  History of hypertension  History of hypertrophic cardiomyopathy and CAD with prior PCI  Patient will now transition to comfort care after terminal extubation.  DVT prophylaxis:None Code Status:DNR-Comfort care Family Communication: Discussed with wife and son today Disposition Plan: Compassionate extubation to comfort care today. Anticipate in-hospital death soon.    Consultants:  General surgery  Pulmonology  Nephrology  Procedures:  Intubation on 11/18  Right IJ central venous line placement 11/19  Antimicrobials:  Anti-infectives (From admission, onward)   Start     Dose/Rate Route Frequency Ordered Stop   06/11/19 1800  vancomycin (VANCOCIN) 500 mg in sodium chloride 0.9 % 100 mL IVPB  Status:  Discontinued     500 mg 100 mL/hr over 60 Minutes Intravenous Every 48 hours 06/11/19 0112 06/11/19 1202   06/11/19 0200  ceFEPIme (MAXIPIME) 1 g in sodium chloride 0.9 % 100 mL IVPB     1 g 200 mL/hr over 30 Minutes Intravenous Every 24 hours 06/11/19 0112     06/10/19 2000  vancomycin (VANCOCIN) IVPB 750 mg/150 ml premix  Status:  Discontinued     750 mg 150 mL/hr over 60 Minutes Intravenous Every 24 hours 06/06/2019 1826 06/10/19 0743   05/31/2019 2300  piperacillin-tazobactam (ZOSYN) IVPB 3.375 g  Status:  Discontinued     3.375 g 12.5 mL/hr over 240 Minutes Intravenous Every 8 hours 06/20/2019 1826 06/10/19 0743   06/08/2019 1830  vancomycin (VANCOCIN) IVPB 1000 mg/200 mL premix     1,000 mg 200 mL/hr over 60 Minutes Intravenous  Once 06/04/2019 1818 06/12/2019 2102   06/13/2019 1830  piperacillin-tazobactam (ZOSYN) IVPB 3.375 g  Status:  Discontinued     3.375 g 12.5 mL/hr over 240  Minutes Intravenous Every 8 hours 06/04/2019 1818 06/10/19 0743       Subjective: Patient seen and evaluated today and appears comfortable on the vent. No acute overnight events noted.  Objective: Vitals:   06/13/19 1100 06/13/19 1150 06/13/19 1200 06/13/19 1300  BP: 133/70  (!) 153/86 (!) 165/84  Pulse:      Resp: '20  20 20  ' Temp: (!) 97.5 F (36.4 C)  (!) 97.5 F (36.4 C) 97.9 F (36.6 C)  TempSrc:      SpO2:  100%    Weight:      Height:        Intake/Output Summary (Last 24 hours) at 06/13/2019 1631 Last data filed at 06/13/2019 1000 Gross per 24 hour  Intake 3005.23 ml  Output 25 ml  Net 2980.23 ml   Filed Weights   06/10/19 0500 06/11/19 0500 06/12/19 0500  Weight: 62.7 kg 65.8 kg 72.1 kg    Examination:  General exam: Sedated on vent Respiratory system: Clear to auscultation. Respiratory effort normal. Intubated on 35% FiO2. Cardiovascular system: S1 & S2 heard, RRR, tachycardic. No JVD, murmurs, rubs, gallops or clicks. No pedal edema. Gastrointestinal system: Abdomen is nondistended, soft and nontender. No organomegaly or masses felt. Normal bowel sounds heard. Central nervous system: Sedated Extremities: No edema. Skin: No rashes, lesions or ulcers Psychiatry: Cannot be assessed given pt  condition    Data Reviewed: I have personally reviewed following labs and imaging studies  CBC: Recent Labs  Lab 06/05/2019 0337 06/10/19 2127 06/11/19 0034 06/12/19 0352 06/12/19 0920 06/13/19 0417  WBC 4.1 7.3 9.5 6.6 6.8 10.2  NEUTROABS 3.5  --   --   --   --   --   HGB 11.7* 14.0 14.2 9.9* 10.0* 9.1*  HCT 35.9* 43.3 43.9 29.1* 29.0* 25.5*  MCV 88.9 88.0 88.7 84.6 84.8 83.9  PLT 211 256 259 166 144* 726*   Basic Metabolic Panel: Recent Labs  Lab 06/10/19 0425  06/11/19 0034  06/11/19 1253 06/11/19 2015 06/12/19 0007 06/12/19 0352 06/13/19 0417  NA 140   < > 137   < > 137 137 134* 133* 133*  K 3.2*   < > 4.3   < > 3.8 4.1 3.4* 3.6 3.6  CL 109   <  > 110   < > 111 110 104 100 94*  CO2 20*   < > 13*   < > 16* 14* 20* 20* 21*  GLUCOSE 159*   < > 305*   < > 165* 148* 240* 248* 127*  BUN 32*   < > 50*   < > 61* 65* 73* 72* 87*  CREATININE 2.03*   < > 3.48*   < > 4.24* 4.44* 4.99* 4.89* 5.89*  CALCIUM 8.4*   < > 8.3*   < > 7.5* 7.4* 7.0* 6.8* 6.8*  MG 2.1  --  2.0  --   --   --   --   --   --    < > = values in this interval not displayed.   GFR: Estimated Creatinine Clearance: 11.2 mL/min (A) (by C-G formula based on SCr of 5.89 mg/dL (H)). Liver Function Tests: Recent Labs  Lab 06/01/2019 0337 06/13/19 0417  AST 27 6,046*  ALT 30 1,160*  ALKPHOS 66 636*  BILITOT 1.5* 1.9*  PROT 6.9 4.6*  ALBUMIN 3.4* 1.7*   Recent Labs  Lab 06/17/2019 0337  LIPASE 34   No results for input(s): AMMONIA in the last 168 hours. Coagulation Profile: Recent Labs  Lab 06/11/19 0933  INR 1.5*   Cardiac Enzymes: Recent Labs  Lab 06/14/2019 0535  CKTOTAL 89   BNP (last 3 results) No results for input(s): PROBNP in the last 8760 hours. HbA1C: No results for input(s): HGBA1C in the last 72 hours. CBG: Recent Labs  Lab 06/13/19 0456 06/13/19 0555 06/13/19 0642 06/13/19 0747 06/13/19 1139  GLUCAP 122* 114* 117* 128* 137*   Lipid Profile: Recent Labs    06/11/19 0043  TRIG 65   Thyroid Function Tests: No results for input(s): TSH, T4TOTAL, FREET4, T3FREE, THYROIDAB in the last 72 hours. Anemia Panel: No results for input(s): VITAMINB12, FOLATE, FERRITIN, TIBC, IRON, RETICCTPCT in the last 72 hours. Sepsis Labs: Recent Labs  Lab 06/11/19 1253 06/11/19 2015 06/12/19 0352 06/13/19 0417  LATICACIDVEN 2.4* 5.1* 2.8* 3.8*    Recent Results (from the past 240 hour(s))  Culture, blood (routine x 2)     Status: None (Preliminary result)   Collection Time: 05/31/2019  3:39 AM   Specimen: BLOOD LEFT HAND  Result Value Ref Range Status   Specimen Description BLOOD LEFT HAND  Final   Special Requests   Final    BOTTLES DRAWN AEROBIC  AND ANAEROBIC Blood Culture results may not be optimal due to an inadequate volume of blood received in culture bottles   Culture  Final    NO GROWTH 4 DAYS Performed at [facility], [address], [place], [place] [address]    Report Status PENDING  Incomplete  Culture, blood (routine x 2)     Status: None (Preliminary result)   Collection Time: 06/17/2019  3:39 AM   Specimen: BLOOD  Result Value Ref Range Status   Specimen Description BLOOD RIGHT ANTECUBITAL  Final   Special Requests   Final    BOTTLES DRAWN AEROBIC AND ANAEROBIC Blood Culture adequate volume   Culture   Final    NO GROWTH 4 DAYS Performed at [facility], [address] [place], [place] [address]    Report Status PENDING  Incomplete  SARS CORONAVIRUS 2 (TAT 6-24 HRS) Nasopharyngeal Nasopharyngeal Swab     Status: None   Collection Time: 05/31/2019  6:48 AM   Specimen: Nasopharyngeal Swab  Result Value Ref Range Status   SARS Coronavirus 2 NEGATIVE NEGATIVE Final    Comment: (NOTE) SARS-CoV-2 target nucleic acids are NOT DETECTED. The SARS-CoV-2 RNA is generally detectable in upper and lower respiratory specimens during the acute phase of infection. Negative results do not preclude SARS-CoV-2 infection, do not rule out co-infections with other pathogens, and should not be used as the sole basis for treatment or other patient management decisions. Negative results must be combined with clinical observations, patient history, and epidemiological information. The expected result is Negative. Fact Sheet for Patients: SugarRoll.be Fact Sheet for Healthcare Providers: https://www.woods-mathews.com/ This test is not yet approved or cleared by the [place] FDA and  has been authorized for detection and/or diagnosis of SARS-CoV-2 by FDA under an Emergency Use Authorization (EUA). This EUA will remain  in effect (meaning this test can be used) for the duration of  the COVID-19 declaration under Section 56 4(b)(1) of the Act, 21 U.S.C. section 360bbb-3(b)(1), unless the authorization is terminated or revoked sooner. Performed at [facility], [place] [address], [place], [place] [address]   MRSA PCR Screening     Status: None   Collection Time: 05/28/2019  8:25 PM   Specimen: Nasal Mucosa; Nasopharyngeal  Result Value Ref Range Status   MRSA by PCR NEGATIVE NEGATIVE Final    Comment:        The GeneXpert MRSA Assay (FDA approved for NASAL specimens only), is one component of a comprehensive MRSA colonization surveillance program. It is not intended to diagnose MRSA infection nor to guide or monitor treatment for MRSA infections. Performed at [facility], [address], [place], [place] [address]   Culture, blood (routine x 2)     Status: None (Preliminary result)   Collection Time: 06/11/19 12:34 AM   Specimen: BLOOD LEFT WRIST  Result Value Ref Range Status   Specimen Description BLOOD LEFT WRIST  Final   Special Requests   Final    BOTTLES DRAWN AEROBIC AND ANAEROBIC Blood Culture results may not be optimal due to an inadequate volume of blood received in culture bottles   Culture   Final    NO GROWTH 2 DAYS Performed at [facility], [address] [place], [place] [address]    Report Status PENDING  Incomplete  Culture, blood (routine x 2)     Status: Abnormal   Collection Time: 06/11/19 12:43 AM   Specimen: BLOOD RIGHT HAND  Result Value Ref Range Status   Specimen Description   Final    BLOOD RIGHT HAND Performed at [facility], [address], [place], [place] [address]  Special Requests   Final    BOTTLES DRAWN AEROBIC ONLY Blood Culture results may not be optimal due to an inadequate volume of blood received in culture bottles Performed at [facility], [address], [place], [place] [address]    Culture  Setup Time   Final    GRAM POSITIVE COCCI Gram Stain Report Called to,Read Back By and Verified With:  LASHLEY,S ON 06/11/19 AT 1825 BY LOY,C AEROBIC BOTTLE PERFORMED AT [facility] Performed at [facility], [address] [name], [place] [address]    Culture (A)  Final    STAPHYLOCOCCUS SPECIES (COAGULASE NEGATIVE) THE SIGNIFICANCE OF ISOLATING THIS ORGANISM FROM A SINGLE SET OF BLOOD CULTURES WHEN MULTIPLE SETS ARE DRAWN IS UNCERTAIN. PLEASE NOTIFY THE MICROBIOLOGY DEPARTMENT WITHIN ONE WEEK IF SPECIATION AND SENSITIVITIES ARE REQUIRED. Performed at [facility], [place] [address] [place], [place], [place] [address]    Report Status 06/12/2019 FINAL  Final         Radiology Studies: Dg Chest Port 1 View  Result Date: 06/12/2019 CLINICAL DATA:  Intubation EXAM: PORTABLE CHEST 1 VIEW COMPARISON:  Yesterday FINDINGS: Endotracheal tube tip at the clavicular heads. The orogastric tube tip reaches the stomach with side port not visible due to underpenetration. Right IJ line with tip at the upper cavoatrial junction. Hazy opacity at the left base which is both pleural fluid and parenchymal. No change from yesterday. Normal heart size and clear right lung. IMPRESSION: 1. Stable hardware positioning. 2. Stable left pleural effusion and pulmonary opacification. Electronically Signed   By: Monte Fantasia M.D.   On: 06/12/2019 07:51        Scheduled Meds:  aspirin EC  81 mg Oral Daily   chlorhexidine gluconate (MEDLINE KIT)  15 mL Mouth Rinse BID   Chlorhexidine Gluconate Cloth  6 each Topical Daily   feeding supplement (GLUCERNA SHAKE)  237 mL Oral TID BM   feeding supplement (PRO-STAT SUGAR FREE 64)  30 mL Oral BID   heparin  5,000 Units Subcutaneous Q8H   mouth rinse  15 mL Mouth Rinse 10 times per day   multivitamin with minerals  1 tablet Oral Daily   pantoprazole (PROTONIX) IV  40 mg Intravenous Q24H   succinylcholine  100 mg Intravenous Once   Continuous Infusions:  sodium chloride Stopped (06/11/19 0859)   ceFEPime (MAXIPIME) IV Stopped (06/13/19 0127)   dextrose 5 % and  0.45% NaCl 75 mL/hr at 06/13/19 0200   fentaNYL infusion INTRAVENOUS Stopped (06/13/19 1600)   insulin 0.4 mL/hr at 06/13/19 0200   propofol (DIPRIVAN) infusion Stopped (06/13/19 1500)    sodium bicarbonate (isotonic) infusion in sterile water 75 mL/hr at 06/13/19 0301     LOS: 3 days    Time spent: 30 minutes    Kendalyn Cranfield D Manuella Ghazi, DO [facility] Pager [phone]  If 7PM-7AM, please contact night-coverage [email or web address] Password [facility] 06/13/2019, 4:31 PM

## 2019-06-13 NOTE — Progress Notes (Signed)
Apparently the plan is for him to have compassionate extubation later today.  I will plan to sign off.  Thanks for allowing me to see him with you

## 2019-06-13 NOTE — Progress Notes (Signed)
Discussed with Dr Manuella Ghazi   Condition has failed to improve over the last 24 hours with declining renal function and anuria.   Family planning withdrawing care this afternoon  Will sign off. Thank you for allowing me to help care for this gentleman

## 2019-06-13 NOTE — Progress Notes (Signed)
Patient extubated at 1514 hr by RT  For comfort care and placed on 4L nasal cannula. RN, Family and chaplain at bedside.

## 2019-06-13 NOTE — Progress Notes (Signed)
Wasted 215 mls of fentanyl drip in stericycle.  Witnessed by Gabriel Earing RN.

## 2019-06-14 LAB — GLUCOSE, CAPILLARY: Glucose-Capillary: 93 mg/dL (ref 70–99)

## 2019-06-14 LAB — LEGIONELLA PNEUMOPHILA SEROGP 1 UR AG: L. pneumophila Serogp 1 Ur Ag: NEGATIVE

## 2019-06-14 MED ORDER — CHLORHEXIDINE GLUCONATE CLOTH 2 % EX PADS
6.0000 | MEDICATED_PAD | Freq: Every day | CUTANEOUS | Status: DC
  Administered 2019-06-14: 6 via TOPICAL

## 2019-06-14 NOTE — Progress Notes (Signed)
PROGRESS NOTE    [name] [name]  AYT:[id number] DOB: 1945-10-04 DOA: 05/30/2019 PCP: Ann Held, MD   Brief Narrative:  Per HPI: [name] [name] a 73 y.o.malewith medical history significant ofdiabetes, hypertension, hypertrophic cardiomyopathy, presents to the emergency room with increasing shortness of breath, generalized weakness, left shoulder pain that radiates into his left arm, left neck and down his left torso. This discomfort is worse when he tries to get up and move/lift something with his arm. He has not had any cough. His wife does report him having a fever of 101[place]earlier today. He describes some lower abdominal pain for the past few days. He has had 2-3 bowel movements today, but they were not reported to be loose. He has had no nausea or vomiting. He does occasionally have dysuria.  11/18:Patient will be transitioned off IV insulin drip today as DKA has resolved. Unfortunately, he continues to have elevated heart rates. We will increase metoprolol oral dose which has been started earlier this morning. Will start Cardizem drip after bolus and obtain further evaluation with toxicology as well as TSH. Considering cardiology evaluation by a.m. if he remains with elevated heart rates and/or unstable.  11/19:Patient continues to have significant sinus tachycardia on the afternoon of 11/18 and case was discussed with cardiology Dr. Harl Bowie. It was recommended that the sinus tachycardia not be suppressed any further as there should be some secondary cause and therefore, Cardizem drip as well as metoprolol were both discontinued. Unfortunately, patient began to have some worsening tachypnea as well and was ultimately intubated and started back on insulin drip due to worsening DKA. He had a repeat CT of his abdomen and pelvis demonstrating now a small bowel obstruction with a clear transition point and some worsening dilation in the small bowel loops. Additionally, he is  now noted to have layering of effusions on the left side of his lung with signs of infiltrate. He was resumed on vancomycin as well as cefepime. He has worsening kidney injury and poor urine output noted as well. He is in critical condition and is currently sedated on the ventilator with FiO2 100% and with propofol and fentanyl for sedation. General surgery and pulmonology have been consulted for assistance in management. I will also consult nephrology for assistance in management with acute kidney injury with oliguria as well as acidosis.  11/20:Patient continues to remain on the ventilator this morning. Discussion was had with family members in the afternoon of 11/19 regarding patient's wishes should he have a cardiopulmonary arrest given his critical condition and overall decline. They have stated that they would like for him to be DNR. Additionally, multiple chest x-rays have been reviewed and appears stable with no definitive signs of pneumothorax after central venous line placement. He continues to have worsening kidney function with little to no urine output. He was noted to have 1 blood culture bottle with gram-positive cocci with identification and sensitivity still pending. Vancomycin was discontinued on 11/19 per nephrology recommendations.  11/21: Patient has been terminally extubated to comfort measures around 1500 today. Family members at bedside.  11/22: Patient remains on nasal cannula with no respiratory distress with wife at bedside.  No acute overnight events noted.  Patient will be transferred to [facility] floor once bed available.  Palliative care evaluation by a.m. to determine further disposition.  Assessment & Plan:   Active Problems:   Hyperlipidemia   Hypertrophic cardiomyopathy ([place])   Hypertension   Type II diabetes mellitus ([place])   Metabolic acidosis  DKA (diabetic ketoacidoses) ([place])   AKI (acute kidney injury) ([place])   Acute respiratory failure with  hypoxia ([facility])   Abdominal distension   Protein-calorie malnutrition, severe   SBO (small bowel obstruction) ([facility])   Severe sepsis ([facility])   Acute renal failure ([facility])   Severe sepsis secondary to left-sided HCAP withongoingfever  Small bowel obstruction  Liver shock secondary to sepsis  DKA in the setting of type 2 diabetes secondary to above processes-ongoing  Acute respiratory failure secondary to distress/tachypnea with HCAP  Worsening AKI with oliguria and acidosis  Ongoing sinus tachycardiadue to above factors  History of hypertension  History of hypertrophic cardiomyopathy and CAD with prior PCI  Continue comfort care measures and transition to MedSurg floor today once bed available.  Palliative care evaluation by a.m. for disposition.  DVT prophylaxis:None Code Status:DNR-Comfort care Family Communication:Discussed with wife  today Disposition Plan:  Continue comfort care measures and anticipate in-hospital death soon.  Palliative care evaluation by a.m. if patient continues to survive with disposition.   Consultants:  General surgery  Pulmonology  Nephrology  Procedures:  Intubation on 11/18  Right IJ central venous line placement 11/19  Compassionate extubation on 11/21  Antimicrobials:  Anti-infectives (From admission, onward)   Start     Dose/Rate Route Frequency Ordered Stop   06/11/19 1800  vancomycin (VANCOCIN) 500 mg in sodium chloride 0.9 % 100 mL IVPB  Status:  Discontinued     500 mg 100 mL/hr over 60 Minutes Intravenous Every 48 hours 06/11/19 0112 06/11/19 1202   06/11/19 0200  ceFEPIme (MAXIPIME) 1 g in sodium chloride 0.9 % 100 mL IVPB     1 g 200 mL/hr over 30 Minutes Intravenous Every 24 hours 06/11/19 0112     06/10/19 2000  vancomycin (VANCOCIN) IVPB 750 mg/150 ml premix  Status:  Discontinued     750 mg 150 mL/hr over 60 Minutes Intravenous Every 24 hours 05/31/2019 1826 06/10/19 0743   06/01/2019 2300   piperacillin-tazobactam (ZOSYN) IVPB 3.375 g  Status:  Discontinued     3.375 g 12.5 mL/hr over 240 Minutes Intravenous Every 8 hours 06/14/2019 1826 06/10/19 0743   06/14/2019 1830  vancomycin (VANCOCIN) IVPB 1000 mg/200 mL premix     1,000 mg 200 mL/hr over 60 Minutes Intravenous  Once 05/28/2019 1818 05/31/2019 2102   05/31/2019 1830  piperacillin-tazobactam (ZOSYN) IVPB 3.375 g  Status:  Discontinued     3.375 g 12.5 mL/hr over 240 Minutes Intravenous Every 8 hours 06/06/2019 1818 06/10/19 0743       Subjective: Patient seen and evaluated today with no new acute complaints or concerns noted overnight since extubation.  He remains on nasal cannula oxygen and appears to be, alert on occasion.  Objective: Vitals:   06/14/19 0200 06/14/19 0300 06/14/19 0400 06/14/19 0500  BP: (!) 141/63 (!) 141/66 (!) 143/65 (!) 145/66  Pulse:      Resp: (!) 44 (!) 24 (!) 23 (!) 34  Temp: 98.4 F (36.9 C) 98.2 F (36.8 C) 98.1 F (36.7 C) 98.1 F (36.7 C)  TempSrc:      SpO2:  100%    Weight:      Height:        Intake/Output Summary (Last 24 hours) at 06/14/2019 0735 Last data filed at 06/14/2019 0500 Gross per 24 hour  Intake 1656.69 ml  Output --  Net 1656.69 ml   Filed Weights   06/10/19 0500 06/11/19 0500 06/12/19 0500  Weight: 62.7 kg 65.8 kg 72.1  kg    Examination:  General exam: Appears calm and comfortable  Respiratory system: Clear to auscultation. Respiratory effort normal.  Currently on 3 L nasal cannula. Cardiovascular system: S1 & S2 heard, RRR. No JVD, murmurs, rubs, gallops or clicks. No pedal edema. Gastrointestinal system: Abdomen is nondistended, soft and nontender. No organomegaly or masses felt. Normal bowel sounds heard. Central nervous system: Somnolent but arousable Extremities: No edema Skin: No rashes, lesions or ulcers Psychiatry: Cannot be assessed    Data Reviewed: I have personally reviewed following labs and imaging studies  CBC: Recent Labs  Lab  05/24/2019 0337 06/10/19 2127 06/11/19 0034 06/12/19 0352 06/12/19 0920 06/13/19 0417  WBC 4.1 7.3 9.5 6.6 6.8 10.2  NEUTROABS 3.5  --   --   --   --   --   HGB 11.7* 14.0 14.2 9.9* 10.0* 9.1*  HCT 35.9* 43.3 43.9 29.1* 29.0* 25.5*  MCV 88.9 88.0 88.7 84.6 84.8 83.9  PLT 211 256 259 166 144* 322*   Basic Metabolic Panel: Recent Labs  Lab 06/10/19 0425  06/11/19 0034  06/11/19 1253 06/11/19 2015 06/12/19 0007 06/12/19 0352 06/13/19 0417  NA 140   < > 137   < > 137 137 134* 133* 133*  K 3.2*   < > 4.3   < > 3.8 4.1 3.4* 3.6 3.6  CL 109   < > 110   < > 111 110 104 100 94*  CO2 20*   < > 13*   < > 16* 14* 20* 20* 21*  GLUCOSE 159*   < > 305*   < > 165* 148* 240* 248* 127*  BUN 32*   < > 50*   < > 61* 65* 73* 72* 87*  CREATININE 2.03*   < > 3.48*   < > 4.24* 4.44* 4.99* 4.89* 5.89*  CALCIUM 8.4*   < > 8.3*   < > 7.5* 7.4* 7.0* 6.8* 6.8*  MG 2.1  --  2.0  --   --   --   --   --   --    < > = values in this interval not displayed.   GFR: Estimated Creatinine Clearance: 11.2 mL/min (A) (by C-G formula based on SCr of 5.89 mg/dL (H)). Liver Function Tests: Recent Labs  Lab 05/25/2019 0337 06/13/19 0417  AST 27 6,046*  ALT 30 1,160*  ALKPHOS 66 636*  BILITOT 1.5* 1.9*  PROT 6.9 4.6*  ALBUMIN 3.4* 1.7*   Recent Labs  Lab 06/22/2019 0337  LIPASE 34   No results for input(s): AMMONIA in the last 168 hours. Coagulation Profile: Recent Labs  Lab 06/11/19 0933  INR 1.5*   Cardiac Enzymes: Recent Labs  Lab 05/27/2019 0535  CKTOTAL 89   BNP (last 3 results) No results for input(s): PROBNP in the last 8760 hours. HbA1C: No results for input(s): HGBA1C in the last 72 hours. CBG: Recent Labs  Lab 06/13/19 0555 06/13/19 0642 06/13/19 0747 06/13/19 1139 06/14/19 0105  GLUCAP 114* 117* 128* 137* 93   Lipid Profile: No results for input(s): CHOL, HDL, LDLCALC, TRIG, CHOLHDL, LDLDIRECT in the last 72 hours. Thyroid Function Tests: No results for input(s): TSH,  T4TOTAL, FREET4, T3FREE, THYROIDAB in the last 72 hours. Anemia Panel: No results for input(s): VITAMINB12, FOLATE, FERRITIN, TIBC, IRON, RETICCTPCT in the last 72 hours. Sepsis Labs: Recent Labs  Lab 06/11/19 1253 06/11/19 2015 06/12/19 0352 06/13/19 0417  LATICACIDVEN 2.4* 5.1* 2.8* 3.8*    Recent Results (from the  past 240 hour(s))  Culture, blood (routine x 2)     Status: None (Preliminary result)   Collection Time: 06/16/2019  3:39 AM   Specimen: BLOOD LEFT HAND  Result Value Ref Range Status   Specimen Description BLOOD LEFT HAND  Final   Special Requests   Final    BOTTLES DRAWN AEROBIC AND ANAEROBIC Blood Culture results may not be optimal due to an inadequate volume of blood received in culture bottles   Culture   Final    NO GROWTH 4 DAYS Performed at [facility], [address], [place], [place] [address]    Report Status PENDING  Incomplete  Culture, blood (routine x 2)     Status: None (Preliminary result)   Collection Time: 06/16/2019  3:39 AM   Specimen: BLOOD  Result Value Ref Range Status   Specimen Description BLOOD RIGHT ANTECUBITAL  Final   Special Requests   Final    BOTTLES DRAWN AEROBIC AND ANAEROBIC Blood Culture adequate volume   Culture   Final    NO GROWTH 4 DAYS Performed at [facility], [address] [place], [place] [address]    Report Status PENDING  Incomplete  SARS CORONAVIRUS 2 (TAT 6-24 HRS) Nasopharyngeal Nasopharyngeal Swab     Status: None   Collection Time: 05/24/2019  6:48 AM   Specimen: Nasopharyngeal Swab  Result Value Ref Range Status   SARS Coronavirus 2 NEGATIVE NEGATIVE Final    Comment: (NOTE) SARS-CoV-2 target nucleic acids are NOT DETECTED. The SARS-CoV-2 RNA is generally detectable in upper and lower respiratory specimens during the acute phase of infection. Negative results do not preclude SARS-CoV-2 infection, do not rule out co-infections with other pathogens, and should not be used as the sole basis for treatment or  other patient management decisions. Negative results must be combined with clinical observations, patient history, and epidemiological information. The expected result is Negative. Fact Sheet for Patients: SugarRoll.be Fact Sheet for Healthcare Providers: https://www.woods-mathews.com/ This test is not yet approved or cleared by the [place] FDA and  has been authorized for detection and/or diagnosis of SARS-CoV-2 by FDA under an Emergency Use Authorization (EUA). This EUA will remain  in effect (meaning this test can be used) for the duration of the COVID-19 declaration under Section 56 4(b)(1) of the Act, 21 U.S.C. section 360bbb-3(b)(1), unless the authorization is terminated or revoked sooner. Performed at [facility], [place] [address], [place], [place] [address]   MRSA PCR Screening     Status: None   Collection Time: 06/08/2019  8:25 PM   Specimen: Nasal Mucosa; Nasopharyngeal  Result Value Ref Range Status   MRSA by PCR NEGATIVE NEGATIVE Final    Comment:        The GeneXpert MRSA Assay (FDA approved for NASAL specimens only), is one component of a comprehensive MRSA colonization surveillance program. It is not intended to diagnose MRSA infection nor to guide or monitor treatment for MRSA infections. Performed at [facility], [address] [place],  [address]   Culture, blood (routine x 2)     Status: None (Preliminary result)   Collection Time: 06/11/19 12:34 AM   Specimen: BLOOD LEFT WRIST  Result Value Ref Range Status   Specimen Description BLOOD LEFT WRIST  Final   Special Requests   Final    BOTTLES DRAWN AEROBIC AND ANAEROBIC Blood Culture results may not be optimal due to an inadequate volume of blood received in culture bottles   Culture  Final    NO GROWTH 2 DAYS Performed at [facility], [address] [place], [place] [address]    Report Status PENDING  Incomplete  Culture, blood (routine x  2)     Status: Abnormal   Collection Time: 06/11/19 12:43 AM   Specimen: BLOOD RIGHT HAND  Result Value Ref Range Status   Specimen Description   Final    BLOOD RIGHT HAND Performed at [facility], [address], [place], [place] [address]    Special Requests   Final    BOTTLES DRAWN AEROBIC ONLY Blood Culture results may not be optimal due to an inadequate volume of blood received in culture bottles Performed at [facility], [address] [place], [place] [address]    Culture  Setup Time   Final    GRAM POSITIVE COCCI Gram Stain Report Called to,Read Back By and Verified With: LASHLEY,S ON 06/11/19 AT 1825 BY LOY,C AEROBIC BOTTLE PERFORMED AT [facility] Performed at [facility], [address], [place], [place] [address]    Culture (A)  Final    STAPHYLOCOCCUS SPECIES (COAGULASE NEGATIVE) THE SIGNIFICANCE OF ISOLATING THIS ORGANISM FROM A SINGLE SET OF BLOOD CULTURES WHEN MULTIPLE SETS ARE DRAWN IS UNCERTAIN. PLEASE NOTIFY THE MICROBIOLOGY DEPARTMENT WITHIN ONE WEEK IF SPECIATION AND SENSITIVITIES ARE REQUIRED. Performed at [facility], [place] [address] [place], [place] [address]    Report Status 06/12/2019 FINAL  Final         Radiology Studies: No results found.      Scheduled Meds:  aspirin EC  81 mg Oral Daily   chlorhexidine gluconate (MEDLINE KIT)  15 mL Mouth Rinse BID   Chlorhexidine Gluconate Cloth  6 each Topical Daily   feeding supplement (GLUCERNA SHAKE)  237 mL Oral TID BM   feeding supplement (PRO-STAT SUGAR FREE 64)  30 mL Oral BID   heparin  5,000 Units Subcutaneous Q8H   mouth rinse  15 mL Mouth Rinse 10 times per day   multivitamin with minerals  1 tablet Oral Daily   pantoprazole (PROTONIX) IV  40 mg Intravenous Q24H   succinylcholine  100 mg Intravenous Once   Continuous Infusions:  sodium chloride Stopped (06/11/19 0859)   ceFEPime (MAXIPIME) IV Stopped (06/13/19 0127)   dextrose 5 % and 0.45% NaCl 75 mL/hr at 06/13/19 0200    fentaNYL infusion INTRAVENOUS Stopped (06/13/19 1600)   insulin 0.4 mL/hr at 06/13/19 0200   propofol (DIPRIVAN) infusion Stopped (06/13/19 1500)    sodium bicarbonate (isotonic) infusion in sterile water 75 mL/hr at 06/13/19 0301     LOS: 4 days    Time spent: 30 minutes    Rachael Ferrie D Manuella Ghazi, DO [facility] Pager [phone]  If 7PM-7AM, please contact night-coverage [email or web address] Password [facility] 06/14/2019, 7:35 AM

## 2019-06-14 NOTE — Progress Notes (Signed)
Pt arrived via bed from ICU accompanied by family (wife and two sons). Pt responds only to pain. Skin warm and dry, resp tachepneic and labored. Foley cath intact with clear yellow urine noted. Saline lock #18g left AC and central line to right neck intact. Family oriented to room and procedures. Wife will be staying all night with patient, blankets and pillows provided. Instructed family to call for any needs. Sympathy extended to each.

## 2019-06-14 NOTE — Progress Notes (Signed)
He has been converted to full comfort care so I will sign off at this point.  Thanks for allowing me to participate in his care

## 2019-06-15 LAB — CULTURE, BLOOD (ROUTINE X 2)
Culture: NO GROWTH
Culture: NO GROWTH
Special Requests: ADEQUATE

## 2019-06-15 MED ORDER — SCOPOLAMINE 1 MG/3DAYS TD PT72
1.0000 | MEDICATED_PATCH | TRANSDERMAL | Status: DC
  Administered 2019-06-15: 1.5 mg via TRANSDERMAL
  Filled 2019-06-15: qty 1

## 2019-06-16 LAB — CULTURE, BLOOD (ROUTINE X 2): Culture: NO GROWTH

## 2019-06-16 MED FILL — Medication: Qty: 1 | Status: AC

## 2019-06-23 NOTE — Death Summary Note (Signed)
Physician Discharge Summary  [name] [name] ZOX:[id number] DOB: 03/22/1946 DOA: July 07, 2019  PCP: Jerlyn Ly, MD  Admit date: 07-Jul-2019  Death date: 2019-07-13 0143  Admitted From:Home  Disposition:  Expired  Brief/Interim Summary: Per HPI: [name] [name] a 73 y.o.malewith medical history significant ofdiabetes, hypertension, hypertrophic cardiomyopathy, presents to the emergency room with increasing shortness of breath, generalized weakness, left shoulder pain that radiates into his left arm, left neck and down his left torso. This discomfort is worse when he tries to get up and move/lift something with his arm. He has not had any cough. His wife does report him having a fever of 101[place]earlier today. He describes some lower abdominal pain for the past few days. He has had 2-3 bowel movements today, but they were not reported to be loose. He has had no nausea or vomiting. He does occasionally have dysuria.  11/18:Patient will be transitioned off IV insulin drip today as DKA has resolved. Unfortunately, he continues to have elevated heart rates. We will increase metoprolol oral dose which has been started earlier this morning. Will start Cardizem drip after bolus and obtain further evaluation with toxicology as well as TSH. Considering cardiology evaluation by a.m. if he remains with elevated heart rates and/or unstable.  11/19:Patient continues to have significant sinus tachycardia on the afternoon of 11/18 and case was discussed with cardiology Dr. Wyline Mood. It was recommended that the sinus tachycardia not be suppressed any further as there should be some secondary cause and therefore, Cardizem drip as well as metoprolol were both discontinued. Unfortunately, patient began to have some worsening tachypnea as well and was ultimately intubated and started back on insulin drip due to worsening DKA. He had a repeat CT of his abdomen and pelvis demonstrating now a small bowel  obstruction with a clear transition point and some worsening dilation in the small bowel loops. Additionally, he is now noted to have layering of effusions on the left side of his lung with signs of infiltrate. He was resumed on vancomycin as well as cefepime. He has worsening kidney injury and poor urine output noted as well. He is in critical condition and is currently sedated on the ventilator with FiO2 100% and with propofol and fentanyl for sedation. General surgery and pulmonology have been consulted for assistance in management. I will also consult nephrology for assistance in management with acute kidney injury with oliguria as well as acidosis.  11/20:Patient continues to remain on the ventilator this morning. Discussion was had with family members in the afternoon of 11/19 regarding patient's wishes should he have a cardiopulmonary arrest given his critical condition and overall decline. They have stated that they would like for him to be DNR. Additionally, multiple chest x-rays have been reviewed and appears stable with no definitive signs of pneumothorax after central venous line placement. He continues to have worsening kidney function with little to no urine output. He was noted to have 1 blood culture bottle with gram-positive cocci with identification and sensitivity still pending. Vancomycin was discontinued on 11/19 per nephrology recommendations.  11/21: Patient has been terminally extubated to comfort measures around 1500 today. Family members at bedside.  11/22: Patient remains on nasal cannula with no respiratory distress with wife at bedside.  No acute overnight events noted.  Patient will be transferred to MedSurg floor once bed available.  Palliative care evaluation by a.m. to determine further disposition.  13-Jul-2023: Patient was transferred to MedSurg floor overnight and had remained on comfort measures protocol.  He  died at [id number] on 06-04-19.  Discharge Diagnoses:   Active Problems:   Hyperlipidemia   Hypertrophic cardiomyopathy (HCC)   Hypertension   Type II diabetes mellitus (HCC)   Metabolic acidosis   DKA (diabetic ketoacidoses) (HCC)   AKI (acute kidney injury) (HCC)   Acute respiratory failure with hypoxia (HCC)   Abdominal distension   Protein-calorie malnutrition, severe   SBO (small bowel obstruction) (HCC)   Severe sepsis (HCC)   Acute renal failure (HCC)  Severe sepsis secondary to left-sided HCAP withongoingfever  Small bowel obstruction  Liver shock secondary to sepsis  DKA in the setting of type 2 diabetes secondary to above processes-ongoing  Acute respiratory failure secondary to distress/tachypnea with HCAP  Worsening AKI with oliguria and acidosis  Ongoing sinus tachycardiadue to above factors  History of hypertension  History of hypertrophic cardiomyopathy and CAD with prior PCI   No Known Allergies  Consultations:  General surgery  Pulmonology  Nephrology   Procedures/Studies: Ct Abdomen Pelvis Wo Contrast  Result Date: 06/11/2019 CLINICAL DATA:  73 year old male with abdominal pain and distension. Recently intubated. EXAM: CT ABDOMEN AND PELVIS WITHOUT CONTRAST TECHNIQUE: Multidetector CT imaging of the abdomen and pelvis was performed following the standard protocol without IV contrast. COMPARISON:  CT Abdomen and Pelvis 06/02/2019. FINDINGS: Lower chest: Increasing layering pleural effusions and confluent new left lower lobe consolidation with air bronchograms. Right lower lobe atelectasis. Middle lobes remain clear. No cardiomegaly or pericardial effusion. Hepatobiliary: Negative noncontrast liver and gallbladder. Pancreas: Negative noncontrast pancreas. Spleen: Negative aside from a small volume of adjacent free fluid. Adrenals/Urinary Tract: Stable since 2019 adrenal gland thickening such as due to adrenal hyperplasia or benign adenomas. Nonobstructed kidneys. Increased bilateral perinephric  soft tissue stranding since 2019 but stable recently. Proximal ureters appear decompressed. No nephrolithiasis. Urinary bladder decompressed by a Foley catheter now. Stomach/Bowel: Enteric tube now in place terminating in the gastric fundus. But there remains moderate gaseous distension of the stomach. The proximal duodenum is also dilated as before. There is oral contrast now in dilated small bowel loops in the left and central abdomen which measure up to 31 millimeters diameter. A gradual transition occurs to decompressed small bowel loops in the pelvis, with the most abrupt transition point identified on coronal image 42 corresponding to series 2, image 71 where gas-filled loops measuring 2 centimeters transition to fully decompressed distal small bowel loops which are just upstream of the terminal ileum. Stool-filled rectum. Sigmoid colon is redundant and contains gas and stool. Similar gas and stool at redundant splenic flexure. Retained low-density stool in the transverse colon. The hepatic flexure is also redundant. Less stool in the right colon. Decompressed cecum and terminal ileum on series 2, image 69. No pneumoperitoneum. Small volume of small bowel mesentery free fluid in the abdomen. Vascular/Lymphatic: Extensive Aortoiliac calcified atherosclerosis. Vascular patency is not evaluated in the absence of IV contrast. Reproductive: Urethral catheter now in place. Other: New or increased moderate volume fluid in the pelvis appears to be free fluid with simple fluid density (series 2, image 77). This is adjacent to the stool-filled rectum, sigmoid colon, and some nondilated small bowel loops. Musculoskeletal: No acute osseous abnormality identified. IMPRESSION: 1. Small Bowel Obstruction with a transition point in the right pelvis on coronal image 42. This may be due to adhesions. Progressive small bowel bowel dilatation since the CT on 05/27/2019 despite the presence of an NG tube terminating in the  stomach. No free air.  Increasing free fluid, now moderate in  the pelvis. 2. Increasing layering pleural effusions and new left lower lobe consolidation suspicious for Pneumonia. 3. Aortic Atherosclerosis (ICD10-I70.0). Electronically Signed   By: Odessa Fleming M.D.   On: 06/11/2019 00:31   Ct Abdomen Pelvis Wo Contrast  Result Date: 05/27/2019 CLINICAL DATA:  Abdominal pain, fever EXAM: CT ABDOMEN AND PELVIS WITHOUT CONTRAST TECHNIQUE: Multidetector CT imaging of the abdomen and pelvis was performed following the standard protocol without IV contrast. COMPARISON:  09/05/2017 FINDINGS: Lower chest: Moderate left pleural effusion and small right pleural effusion. Compressive atelectasis in the lower lobes. Heart is normal size. Densely calcified coronary arteries and aorta. Hepatobiliary: No focal hepatic abnormality. Gallbladder unremarkable. Pancreas: No focal abnormality or ductal dilatation. Spleen: No focal abnormality.  Normal size. Adrenals/Urinary Tract: Left adrenal nodules are low-density and similar to prior study, likely adenomas. No visible renal mass. No hydronephrosis. No renal or ureteral stones. Urinary bladder unremarkable. Stomach/Bowel: Large stool burden throughout the colon. There are mildly prominent and dilated small bowel loops into the pelvis. Distal small bowel loops appear decompressed and grossly unremarkable. Findings are concerning for partial small bowel obstruction. Vascular/Lymphatic: Heavily calcified aorta and iliac vessels. No aneurysm or adenopathy. Reproductive: Mildly prominent prostate. Other: No free fluid or free air. Musculoskeletal: No acute bony abnormality. IMPRESSION: Mildly dilated small bowel loops with distal small bowel loops decompressed. Findings are concerning for partial small bowel obstruction. Exact transition or cause not visualized. Large stool burden throughout the colon. Moderate left pleural effusion and small right pleural effusion. Bibasilar atelectasis.  Left adrenal nodules again noted, similar to prior study, likely adenomas. Heavily calcified aorta and iliac vessels. Electronically Signed   By: Charlett Nose M.D.   On: 06/08/2019 21:43   Ct Chest Wo Contrast  Result Date: 06/07/2019 CLINICAL DATA:  Left-sided chest pain. Shortness of breath. EXAM: CT CHEST WITHOUT CONTRAST TECHNIQUE: Multidetector CT imaging of the chest was performed following the standard protocol without IV contrast. COMPARISON:  Chest x-ray dated 05/28/2019 and CT scan of the abdomen dated 09/05/2017 FINDINGS: Cardiovascular: Aortic atherosclerosis. Coronary artery calcifications. Heart size is normal. No pericardial effusion. Mediastinum/Nodes: No enlarged mediastinal or axillary lymph nodes. Thyroid gland, trachea, and esophagus demonstrate no significant findings. Lungs/Pleura: There are no infiltrates. There is a small left pleural effusion. Tiny right effusion. Minimal secondary atelectasis at the lung bases. The lungs are otherwise clear. Upper Abdomen: There is chronic bilateral adrenal enlargement. Tiny amount of ascites in the left upper quadrant. Musculoskeletal: No chest wall mass or suspicious bone lesions identified. IMPRESSION: 1. Small left and tiny right pleural effusions with secondary atelectasis at the lung bases. 2. Chronic bilateral adrenal enlargement. 3. Tiny amount of ascites in the left upper quadrant. 4. Aortic Atherosclerosis (ICD10-I70.0). Electronically Signed   By: Francene Boyers M.D.   On: 06/20/2019 12:14   Nm Pulmonary Perfusion  Result Date: 06/06/2019 CLINICAL DATA:  Chest pain and shortness of breath EXAM: NUCLEAR MEDICINE PERFUSION LUNG SCAN TECHNIQUE: Perfusion images were obtained in multiple projections after intravenous injection of radiopharmaceutical. Ventilation scans intentionally deferred if perfusion scan and chest x-ray adequate for interpretation during COVID 19 epidemic. Views: Anterior, posterior, left lateral, right lateral, RPO,  LPO, RAO, LAO RADIOPHARMACEUTICALS:  1.5 mCi Tc-3m MAA IV COMPARISON:  Chest radiograph and chest CT June 09, 2019 FINDINGS: Radiotracer uptake is homogeneous and symmetric bilaterally. No perfusion defects evident. IMPRESSION: No appreciable perfusion defects. Very low probability of pulmonary embolus. Electronically Signed   By: Bretta Bang III M.D.  On: 06-19-19 15:21   Dg Chest Port 1 View  Result Date: 06/12/2019 CLINICAL DATA:  Intubation EXAM: PORTABLE CHEST 1 VIEW COMPARISON:  Yesterday FINDINGS: Endotracheal tube tip at the clavicular heads. The orogastric tube tip reaches the stomach with side port not visible due to underpenetration. Right IJ line with tip at the upper cavoatrial junction. Hazy opacity at the left base which is both pleural fluid and parenchymal. No change from yesterday. Normal heart size and clear right lung. IMPRESSION: 1. Stable hardware positioning. 2. Stable left pleural effusion and pulmonary opacification. Electronically Signed   By: Marnee Spring M.D.   On: 06/12/2019 07:51   Dg Chest Port 1 View  Result Date: 06/11/2019 CLINICAL DATA:  Respiratory failure EXAM: PORTABLE CHEST 1 VIEW COMPARISON:  06/10/2019 chest radiograph. FINDINGS: Endotracheal tube tip is 5.8 cm above the carina. Enteric tube terminates in the proximal stomach. Stable cardiomediastinal silhouette with normal heart size. No pneumothorax. Stable small left pleural effusion. No right pleural effusion. No pulmonary edema. Patchy left lung base opacity is stable. IMPRESSION: 1. Well-positioned support structures. 2. Stable small left pleural effusion with patchy left lung base opacity, cannot exclude aspiration or pneumonia. Electronically Signed   By: Delbert Phenix M.D.   On: 06/11/2019 10:13   Dg Chest Port 1 View  Result Date: 06/10/2019 CLINICAL DATA:  Dyspnea. Shortness of breath. EXAM: PORTABLE CHEST 1 VIEW COMPARISON:  06-19-2019 FINDINGS: There is a small left pleural  effusion. The lungs are otherwise clear. Heart size and vascularity are normal. Aortic atherosclerosis. No acute bone abnormality. IMPRESSION: 1. Small left pleural effusion. 2.  Aortic Atherosclerosis (ICD10-I70.0). 3. No other significant abnormalities. Electronically Signed   By: Francene Boyers M.D.   On: 06/10/2019 21:12   Dg Chest Port 1 View  Result Date: 06/19/19 CLINICAL DATA:  73 year old male with left side chest pain, fatigue. EXAM: PORTABLE CHEST 1 VIEW COMPARISON:  Chest radiographs 05/21/2018 and earlier. FINDINGS: Portable AP upright view at 0243 hours. Stable lung volumes. Mediastinal contours remain normal. Visualized tracheal air column is within normal limits. Allowing for portable technique the lungs are clear. No pneumothorax. Negative visible bowel gas pattern. No acute osseous abnormality identified. IMPRESSION: Negative portable chest. Electronically Signed   By: Odessa Fleming M.D.   On: 06/19/2019 02:56   Dg Chest Port 1v Same Day  Result Date: 06/11/2019 CLINICAL DATA:  Follow-up pneumothorax EXAM: PORTABLE CHEST 1 VIEW COMPARISON:  06/11/2019, 06/10/2019 FINDINGS: Endotracheal tube tip is about 5.5 cm superior to the carina. Esophageal tube tip overlies the proximal stomach. Right-sided central venous catheter tip over the cavoatrial region. Stable airspace disease at the left base. Normal heart size. No definitive right pneumothorax is seen IMPRESSION: 1. No definitive right pneumothorax seen. 2. Similar appearance of small left effusion and left basilar airspace disease. Electronically Signed   By: Jasmine Pang M.D.   On: 06/11/2019 15:50   Dg Chest Port 1v Same Day  Result Date: 06/11/2019 CLINICAL DATA:  Central line placement. EXAM: PORTABLE CHEST 1 VIEW COMPARISON:  Same day. FINDINGS: The heart size and mediastinal contours are within normal limits. Endotracheal and nasogastric tubes are unchanged in position. Minimal right apical pneumothorax is noted. Right internal  jugular catheter is noted with distal tip in expected position of cavoatrial junction. Stable left basilar atelectasis or infiltrate is noted with associated pleural effusion. The visualized skeletal structures are unremarkable. IMPRESSION: Interval placement of right internal jugular catheter with distal tip in expected position of cavoatrial junction.  Minimal right apical pneumothorax is noted. These results will be called to the ordering clinician or representative by the Radiologist Assistant, and communication documented in the PACS or zVision Dashboard. Stable left basilar atelectasis or infiltrate is noted with associated pleural effusion. Electronically Signed   By: Lupita Raider M.D.   On: 06/11/2019 11:32   Dg Chest Port 1v Same Day  Result Date: 06/10/2019 CLINICAL DATA:  Post intubation EXAM: PORTABLE CHEST 1 VIEW COMPARISON:  06/10/2019 FINDINGS: Interval placement of endotracheal tube with the tip 7 cm above the carina. NG tube tip is in the proximal stomach with the side port in the distal esophagus. Continued layering left effusion with left base atelectasis. Right lung clear. Heart is normal size. No acute bony abnormality. IMPRESSION: Endotracheal tube 7 cm above the carina. Continued layering left effusion with left base atelectasis. Electronically Signed   By: Charlett Nose M.D.   On: 06/10/2019 23:06      The results of significant diagnostics from this hospitalization (including imaging, microbiology, ancillary and laboratory) are listed below for reference.     Microbiology: Recent Results (from the past 240 hour(s))  Culture, blood (routine x 2)     Status: None (Preliminary result)   Collection Time: 06/14/2019  3:39 AM   Specimen: BLOOD LEFT HAND  Result Value Ref Range Status   Specimen Description BLOOD LEFT HAND  Final   Special Requests   Final    BOTTLES DRAWN AEROBIC AND ANAEROBIC Blood Culture results may not be optimal due to an inadequate volume of blood  received in culture bottles   Culture   Final    NO GROWTH 4 DAYS Performed at [facility], [address] [place], [place] [address]    Report Status PENDING  Incomplete  Culture, blood (routine x 2)     Status: None (Preliminary result)   Collection Time: 06/07/2019  3:39 AM   Specimen: BLOOD  Result Value Ref Range Status   Specimen Description BLOOD RIGHT ANTECUBITAL  Final   Special Requests   Final    BOTTLES DRAWN AEROBIC AND ANAEROBIC Blood Culture adequate volume   Culture   Final    NO GROWTH 4 DAYS Performed at [facility], [address], [place], [place] [address]    Report Status PENDING  Incomplete  SARS CORONAVIRUS 2 (TAT 6-24 HRS) Nasopharyngeal Nasopharyngeal Swab     Status: None   Collection Time: 06/13/2019  6:48 AM   Specimen: Nasopharyngeal Swab  Result Value Ref Range Status   SARS Coronavirus 2 NEGATIVE NEGATIVE Final    Comment: (NOTE) SARS-CoV-2 target nucleic acids are NOT DETECTED. The SARS-CoV-2 RNA is generally detectable in upper and lower respiratory specimens during the acute phase of infection. Negative results do not preclude SARS-CoV-2 infection, do not rule out co-infections with other pathogens, and should not be used as the sole basis for treatment or other patient management decisions. Negative results must be combined with clinical observations, patient history, and epidemiological information. The expected result is Negative. Fact Sheet for Patients: HairSlick.no Fact Sheet for Healthcare Providers: quierodirigir.com This test is not yet approved or cleared by the [place] FDA and  has been authorized for detection and/or diagnosis of SARS-CoV-2 by FDA under an Emergency Use Authorization (EUA). This EUA will remain  in effect (meaning this test can be used) for the duration of the COVID-19 declaration under Section 56 4(b)(1) of the Act, 21 U.S.C. section 360bbb-3(b)(1),  unless the authorization is terminated  or revoked sooner. Performed at [facility], [address] [place], [place] [address]   MRSA PCR Screening     Status: None   Collection Time: 06/11/2019  8:25 PM   Specimen: Nasal Mucosa; Nasopharyngeal  Result Value Ref Range Status   MRSA by PCR NEGATIVE NEGATIVE Final    Comment:        The GeneXpert MRSA Assay (FDA approved for NASAL specimens only), is one component of a comprehensive MRSA colonization surveillance program. It is not intended to diagnose MRSA infection nor to guide or monitor treatment for MRSA infections. Performed at [facility], [address], [place], [place] [address]   Culture, blood (routine x 2)     Status: None (Preliminary result)   Collection Time: 06/11/19 12:34 AM   Specimen: BLOOD LEFT WRIST  Result Value Ref Range Status   Specimen Description BLOOD LEFT WRIST  Final   Special Requests   Final    BOTTLES DRAWN AEROBIC AND ANAEROBIC Blood Culture results may not be optimal due to an inadequate volume of blood received in culture bottles   Culture   Final    NO GROWTH 2 DAYS Performed at [facility], [address] [place], [place] [address]    Report Status PENDING  Incomplete  Culture, blood (routine x 2)     Status: Abnormal   Collection Time: 06/11/19 12:43 AM   Specimen: BLOOD RIGHT HAND  Result Value Ref Range Status   Specimen Description   Final    BLOOD RIGHT HAND Performed at [facility], [address], [place], [place] [address]    Special Requests   Final    BOTTLES DRAWN AEROBIC ONLY Blood Culture results may not be optimal due to an inadequate volume of blood received in culture bottles Performed at [facility], [address] [place], [place] [address]    Culture  Setup Time   Final    GRAM POSITIVE COCCI Gram Stain Report Called to,Read Back By and Verified With: LASHLEY,S ON 06/11/19 AT 1825 BY LOY,C AEROBIC BOTTLE PERFORMED AT [facility] Performed at [facility],  [address], [place], [place] [address]    Culture (A)  Final    STAPHYLOCOCCUS SPECIES (COAGULASE NEGATIVE) THE SIGNIFICANCE OF ISOLATING THIS ORGANISM FROM A SINGLE SET OF BLOOD CULTURES WHEN MULTIPLE SETS ARE DRAWN IS UNCERTAIN. PLEASE NOTIFY THE MICROBIOLOGY DEPARTMENT WITHIN ONE WEEK IF SPECIATION AND SENSITIVITIES ARE REQUIRED. Performed at [facility], [address], [place], [place] [address]    Report Status 06/12/2019 FINAL  Final     Labs: BNP (last 3 results) No results for input(s): BNP in the last 8760 hours. Basic Metabolic Panel: Recent Labs  Lab 06/10/19 0425  06/11/19 0034  06/11/19 1253 06/11/19 2015 06/12/19 0007 06/12/19 0352 06/13/19 0417  NA 140   < > 137   < > 137 137 134* 133* 133*  K 3.2*   < > 4.3   < > 3.8 4.1 3.4* 3.6 3.6  CL 109   < > 110   < > 111 110 104 100 94*  CO2 20*   < > 13*   < > 16* 14* 20* 20* 21*  GLUCOSE 159*   < > 305*   < > 165* 148* 240* 248* 127*  BUN 32*   < > 50*   < > 61* 65* 73* 72* 87*  CREATININE 2.03*   < > 3.48*   < > 4.24* 4.44* 4.99*  4.89* 5.89*  CALCIUM 8.4*   < > 8.3*   < > 7.5* 7.4* 7.0* 6.8* 6.8*  MG 2.1  --  2.0  --   --   --   --   --   --    < > = values in this interval not displayed.   Liver Function Tests: Recent Labs  Lab Jul 08, 2019 0337 06/13/19 0417  AST 27 6,046*  ALT 30 1,160*  ALKPHOS 66 636*  BILITOT 1.5* 1.9*  PROT 6.9 4.6*  ALBUMIN 3.4* 1.7*   Recent Labs  Lab 08-Jul-2019 0337  LIPASE 34   No results for input(s): AMMONIA in the last 168 hours. CBC: Recent Labs  Lab 2019-07-08 0337 06/10/19 2127 06/11/19 0034 06/12/19 0352 06/12/19 0920 06/13/19 0417  WBC 4.1 7.3 9.5 6.6 6.8 10.2  NEUTROABS 3.5  --   --   --   --   --   HGB 11.7* 14.0 14.2 9.9* 10.0* 9.1*  HCT 35.9* 43.3 43.9 29.1* 29.0* 25.5*  MCV 88.9 88.0 88.7 84.6 84.8 83.9  PLT 211 256 259 166 144* 109*   Cardiac Enzymes: Recent Labs  Lab 07/08/2019 0535  CKTOTAL 89   BNP: Invalid input(s): POCBNP CBG: Recent Labs   Lab 06/13/19 0555 06/13/19 0642 06/13/19 0747 06/13/19 1139 06/14/19 0105  GLUCAP 114* 117* 128* 137* 93   D-Dimer No results for input(s): DDIMER in the last 72 hours. Hgb A1c No results for input(s): HGBA1C in the last 72 hours. Lipid Profile No results for input(s): CHOL, HDL, LDLCALC, TRIG, CHOLHDL, LDLDIRECT in the last 72 hours. Thyroid function studies No results for input(s): TSH, T4TOTAL, T3FREE, THYROIDAB in the last 72 hours.  Invalid input(s): FREET3 Anemia work up No results for input(s): VITAMINB12, FOLATE, FERRITIN, TIBC, IRON, RETICCTPCT in the last 72 hours. Urinalysis    Component Value Date/Time   COLORURINE YELLOW 08-Jul-2019 0221   APPEARANCEUR CLOUDY (A) 07/08/2019 0221   LABSPEC 1.020 07/08/19 0221   PHURINE 5.0 08-Jul-2019 0221   GLUCOSEU >=500 (A) 07/08/2019 0221   HGBUR MODERATE (A) 07/08/19 0221   HGBUR negative 09/23/2007 0907   BILIRUBINUR NEGATIVE 07-08-19 0221   KETONESUR 20 (A) 2019/07/08 0221   PROTEINUR 30 (A) 2019-07-08 0221   UROBILINOGEN 0.2 07/28/2011 1456   NITRITE NEGATIVE July 08, 2019 0221   LEUKOCYTESUR NEGATIVE 07-08-19 0221   Sepsis Labs Invalid input(s): PROCALCITONIN,  WBC,  LACTICIDVEN Microbiology Recent Results (from the past 240 hour(s))  Culture, blood (routine x 2)     Status: None (Preliminary result)   Collection Time: 07/08/2019  3:39 AM   Specimen: BLOOD LEFT HAND  Result Value Ref Range Status   Specimen Description BLOOD LEFT HAND  Final   Special Requests   Final    BOTTLES DRAWN AEROBIC AND ANAEROBIC Blood Culture results may not be optimal due to an inadequate volume of blood received in culture bottles   Culture   Final    NO GROWTH 4 DAYS Performed at [facility], [address] [place], [place] [address]    Report Status PENDING  Incomplete  Culture, blood (routine x 2)     Status: None (Preliminary result)   Collection Time: 2019-07-08  3:39 AM   Specimen: BLOOD  Result Value Ref Range Status    Specimen Description BLOOD RIGHT ANTECUBITAL  Final   Special Requests   Final    BOTTLES DRAWN AEROBIC AND ANAEROBIC Blood Culture adequate volume   Culture   Final    NO  GROWTH 4 DAYS Performed at [facility], [address], [place], [place] [address]    Report Status PENDING  Incomplete  SARS CORONAVIRUS 2 (TAT 6-24 HRS) Nasopharyngeal Nasopharyngeal Swab     Status: None   Collection Time: 04-Jul-2019  6:48 AM   Specimen: Nasopharyngeal Swab  Result Value Ref Range Status   SARS Coronavirus 2 NEGATIVE NEGATIVE Final    Comment: (NOTE) SARS-CoV-2 target nucleic acids are NOT DETECTED. The SARS-CoV-2 RNA is generally detectable in upper and lower respiratory specimens during the acute phase of infection. Negative results do not preclude SARS-CoV-2 infection, do not rule out co-infections with other pathogens, and should not be used as the sole basis for treatment or other patient management decisions. Negative results must be combined with clinical observations, patient history, and epidemiological information. The expected result is Negative. Fact Sheet for Patients: SugarRoll.be Fact Sheet for Healthcare Providers: https://www.woods-mathews.com/ This test is not yet approved or cleared by the [place] FDA and  has been authorized for detection and/or diagnosis of SARS-CoV-2 by FDA under an Emergency Use Authorization (EUA). This EUA will remain  in effect (meaning this test can be used) for the duration of the COVID-19 declaration under Section 56 4(b)(1) of the Act, 21 U.S.C. section 360bbb-3(b)(1), unless the authorization is terminated or revoked sooner. Performed at [facility], [place] [address], [place], [place] [address]   MRSA PCR Screening     Status: None   Collection Time: 04-Jul-2019  8:25 PM   Specimen: Nasal Mucosa; Nasopharyngeal  Result Value Ref Range Status   MRSA by PCR NEGATIVE NEGATIVE Final    Comment:         The GeneXpert MRSA Assay (FDA approved for NASAL specimens only), is one component of a comprehensive MRSA colonization surveillance program. It is not intended to diagnose MRSA infection nor to guide or monitor treatment for MRSA infections. Performed at [facility], [address], [place], [place] [address]   Culture, blood (routine x 2)     Status: None (Preliminary result)   Collection Time: 06/11/19 12:34 AM   Specimen: BLOOD LEFT WRIST  Result Value Ref Range Status   Specimen Description BLOOD LEFT WRIST  Final   Special Requests   Final    BOTTLES DRAWN AEROBIC AND ANAEROBIC Blood Culture results may not be optimal due to an inadequate volume of blood received in culture bottles   Culture   Final    NO GROWTH 2 DAYS Performed at [facility], [address], [place], [place] [address]    Report Status PENDING  Incomplete  Culture, blood (routine x 2)     Status: Abnormal   Collection Time: 06/11/19 12:43 AM   Specimen: BLOOD RIGHT HAND  Result Value Ref Range Status   Specimen Description   Final    BLOOD RIGHT HAND Performed at [facility], [address] [place], [place] [address]    Special Requests   Final    BOTTLES DRAWN AEROBIC ONLY Blood Culture results may not be optimal due to an inadequate volume of blood received in culture bottles Performed at [facility], [address], [place], [place] [address]    Culture  Setup Time   Final    GRAM POSITIVE COCCI Gram Stain Report Called to,Read Back By and Verified With: LASHLEY,S ON 06/11/19 AT 1825 BY LOY,C AEROBIC BOTTLE PERFORMED AT [facility] Performed at [facility], [address], [place], [place] [address]    Culture (A)  Final    STAPHYLOCOCCUS SPECIES (COAGULASE NEGATIVE) THE SIGNIFICANCE OF ISOLATING THIS ORGANISM FROM A SINGLE SET OF BLOOD CULTURES WHEN MULTIPLE SETS ARE DRAWN IS UNCERTAIN. PLEASE NOTIFY THE MICROBIOLOGY DEPARTMENT WITHIN ONE WEEK IF SPECIATION AND SENSITIVITIES ARE  REQUIRED. Performed at [facility], [address], [place], [place] [address]    Report Status 06/12/2019 FINAL  Final     Time coordinating discharge: 35 minutes  SIGNED:   Erick Blinks, DO [facility] July 03, 2019, 7:07 AM  If 7PM-7AM, please contact night-coverage [email or web address]

## 2019-06-23 DEATH — deceased
